# Patient Record
Sex: Male | Born: 1955 | Race: White | Hispanic: No | Marital: Single
Health system: Midwestern US, Community
[De-identification: ages and names within clinical notes are randomized; demographics above are authoritative.]

## PROBLEM LIST (undated history)

## (undated) DIAGNOSIS — N401 Enlarged prostate with lower urinary tract symptoms: Secondary | ICD-10-CM

## (undated) DIAGNOSIS — N390 Urinary tract infection, site not specified: Secondary | ICD-10-CM

## (undated) DIAGNOSIS — R339 Retention of urine, unspecified: Secondary | ICD-10-CM

## (undated) DIAGNOSIS — E11621 Type 2 diabetes mellitus with foot ulcer: Principal | ICD-10-CM

## (undated) DIAGNOSIS — E1165 Type 2 diabetes mellitus with hyperglycemia: Principal | ICD-10-CM

## (undated) DIAGNOSIS — N200 Calculus of kidney: Secondary | ICD-10-CM

## (undated) DIAGNOSIS — I10 Essential (primary) hypertension: Secondary | ICD-10-CM

## (undated) DIAGNOSIS — Z125 Encounter for screening for malignant neoplasm of prostate: Secondary | ICD-10-CM

## (undated) DIAGNOSIS — L97502 Non-pressure chronic ulcer of other part of unspecified foot with fat layer exposed: Secondary | ICD-10-CM

## (undated) DIAGNOSIS — R3912 Poor urinary stream: Secondary | ICD-10-CM

## (undated) DIAGNOSIS — L089 Local infection of the skin and subcutaneous tissue, unspecified: Secondary | ICD-10-CM

## (undated) DIAGNOSIS — R7989 Other specified abnormal findings of blood chemistry: Secondary | ICD-10-CM

## (undated) DIAGNOSIS — E119 Type 2 diabetes mellitus without complications: Secondary | ICD-10-CM

---

## 2017-04-05 ENCOUNTER — Inpatient Hospital Stay
Admit: 2017-04-05 | Discharge: 2017-04-05 | Disposition: A | Discharge status: Home / Self Care | Payer: PRIVATE HEALTH INSURANCE | Attending: Physician Assistant

## 2017-04-05 DIAGNOSIS — N390 Urinary tract infection, site not specified: Secondary | ICD-10-CM

## 2017-04-05 LAB — UA WITH REFLEX MICRO AND CULTURE
Bilirubin: NEGATIVE
Glucose: NEGATIVE mg/dL
Ketone: NEGATIVE mg/dL
Nitrites: NEGATIVE
Protein: NEGATIVE mg/dL
Specific gravity: 1.011 (ref 1.001–1.035)
Urobilinogen: 0.2 EU/dL (ref 0.2–1.0)
pH (UA): 7 (ref 5–8)

## 2017-04-05 LAB — URINE MICROSCOPIC WITH REFLEX CULTURE
Bacteria: NOT DETECTED /hpf
Epithelial cells: NOT DETECTED /hpf (ref <5)
Hyaline cast: NOT DETECTED /lpf (ref <8)
RBC: 2 /hpf (ref <4)
WBC: >100 /hpf — ABNORMAL HIGH (ref <5)

## 2017-04-05 MED ORDER — CIPROFLOXACIN 500 MG TAB
500 mg | ORAL_TABLET | Freq: Two times a day (BID) | ORAL | 0 refills | Status: AC
Start: 2017-04-05 — End: 2017-04-12

## 2017-04-05 MED ORDER — CIPROFLOXACIN 500 MG TAB
500 mg | ORAL | Status: AC
Start: 2017-04-05 — End: 2017-04-05
  Administered 2017-04-05: 18:00:00 via ORAL

## 2017-04-05 MED FILL — CIPROFLOXACIN 500 MG TAB: 500 mg | ORAL | Qty: 1

## 2017-04-05 NOTE — ED Triage Notes (Signed)
Patient here with symptoms of UTI. Patient states his urine is cloudy, denies abdominal pain, positive for fevers and chills for the last 3 days, mild burning in urination. Patient states that he has had for the last days some viral illness which resulted in lack of energy, feeling run down, spouse was sick with similar symptoms for 3 days.

## 2017-04-05 NOTE — Progress Notes (Signed)
Patient on Cipro sensitivities pending

## 2017-04-05 NOTE — Other (Signed)
Pt verbalizes understanding of discharge instructions, finding a PCP and medication.

## 2017-04-05 NOTE — ED Provider Notes (Addendum)
62 year old male presents with urinary symptoms and flu symptoms.  The patient states his urinary symptoms including dysuria and frequency began well over a week ago, close to 2 weeks ago.  His wife developed flu-like symptoms last week, including high fever, body aches, and very simple URI symptoms. A few days later he developed the same.  She room proved completely in 3 days.  He continues to have his symptoms along with the continued dysuria and frequency.  He is a type 2 diabetic.  He has no history of UTIs.   He has felt feverish but has not taken his temperature.  He has had chills, extreme fatigue.  No URI symptoms. No nausea or vomiting, but appetite is poor and he admits to not drinking enough fluids.      The history is provided by the patient.   Bladder Infection    Episode onset: 1-2 weeks. The problem occurs every urination. The problem has not changed since onset.The pain is mild. There has been no fever. Associated symptoms include chills and frequency. Pertinent negatives include no sweats, no nausea, no vomiting, no discharge, no hematuria, no hesitancy and no flank pain.        No past medical history on file.    No past surgical history on file.      No family history on file.    Social History     Socioeconomic History   ??? Marital status: SINGLE     Spouse name: Not on file   ??? Number of children: Not on file   ??? Years of education: Not on file   ??? Highest education level: Not on file   Social Needs   ??? Financial resource strain: Not on file   ??? Food insecurity - worry: Not on file   ??? Food insecurity - inability: Not on file   ??? Transportation needs - medical: Not on file   ??? Transportation needs - non-medical: Not on file   Occupational History   ??? Not on file   Tobacco Use   ??? Smoking status: Not on file   Substance and Sexual Activity   ??? Alcohol use: Not on file   ??? Drug use: Not on file   ??? Sexual activity: Not on file   Other Topics Concern   ??? Not on file   Social History Narrative    ??? Not on file         ALLERGIES: Pcn [penicillins] and Sulfa (sulfonamide antibiotics)    Review of Systems   Constitutional: Positive for chills.   Gastrointestinal: Negative for nausea and vomiting.   Genitourinary: Positive for frequency. Negative for flank pain, hematuria and hesitancy.   All other systems reviewed and are negative.      Vitals:    04/05/17 1154   BP: 125/63   Pulse: 81   Resp: 16   Temp: 98.8 ??F (37.1 ??C)   SpO2: 95%   Weight: 127 kg (280 lb)   Height: 6\' 5"  (1.956 m)            Physical Exam   Constitutional: He is oriented to person, place, and time. He appears well-developed and well-nourished.   HENT:   Head: Normocephalic.   Right Ear: External ear normal.   Left Ear: External ear normal.   Nose: Nose normal.   Mouth/Throat: Oropharynx is clear and moist. No oropharyngeal exudate.   Eyes: EOM are normal. Pupils are equal, round, and reactive to light.  Neck: Normal range of motion.   Cardiovascular: Normal rate, regular rhythm and normal heart sounds.   Pulmonary/Chest: Effort normal and breath sounds normal.   Abdominal: Soft. Bowel sounds are normal. There is no tenderness.   No CVA tenderness.   Musculoskeletal: Normal range of motion. He exhibits no edema, tenderness or deformity.   Lymphadenopathy:     He has no cervical adenopathy.   Neurological: He is alert and oriented to person, place, and time. He has normal reflexes.   Skin: Skin is warm and dry. No rash noted. No pallor.   Psychiatric: He has a normal mood and affect. Thought content normal.   Nursing note and vitals reviewed.       MDM  Number of Diagnoses or Management Options  Diagnosis management comments: Pleasant 62 year old male presents with urinary symptoms. 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  No prior UTIs.  On exam, the patient is comfortable, afebrile, nontoxic appearing.  No CVA tenderness. He is a type 2 diabetic with well controlled blood sugar.  There has been no vomiting.   The patient also  has an apparent exposure to a flu-like illness, as his wife describes she had flu-like symptoms last week.   Urinalysis will be obtained.  The patient will be reassessed.    ED Course as of Apr 06 1302   Sat Apr 05, 2017   1301 This patient presents with suspicion for urinary tract infection symptoms. He is a diabetic.  On exam, the patient is in no acute distress, afebrile, nontoxic appearing.  He does have greater than 100 WBCs in his urine, but no bacteria.  He will be treated empirically with Cipro twice per day for 7 days and will follow up closely with primary care.  Urine culture is pending.  [PD]      ED Course User Index  [PD] Glendon Axeias, Lafern Brinkley A., PA-C       Procedures

## 2017-04-08 LAB — CULTURE, URINE

## 2017-11-12 ENCOUNTER — Encounter: Attending: Family Medicine | Primary: Family Medicine

## 2019-01-20 ENCOUNTER — Ambulatory Visit
Admit: 2019-01-20 | Discharge: 2019-01-20 | Payer: PRIVATE HEALTH INSURANCE | Attending: Family Medicine | Primary: Family Medicine

## 2019-01-20 ENCOUNTER — Ambulatory Visit: Attending: Family Medicine | Primary: Family Medicine

## 2019-01-20 DIAGNOSIS — I10 Essential (primary) hypertension: Secondary | ICD-10-CM

## 2019-01-20 MED ORDER — DOXYCYCLINE 100 MG TAB
100 mg | ORAL_TABLET | Freq: Two times a day (BID) | ORAL | 1 refills | Status: AC
Start: 2019-01-20 — End: 2019-02-03

## 2019-01-20 MED ORDER — LISINOPRIL 5 MG TAB
5 mg | ORAL_TABLET | Freq: Every day | ORAL | 0 refills | Status: DC
Start: 2019-01-20 — End: 2019-02-02

## 2019-01-20 MED ORDER — AMLODIPINE 10 MG TAB
10 mg | ORAL_TABLET | ORAL | 0 refills | Status: DC
Start: 2019-01-20 — End: 2019-04-26

## 2019-01-20 MED ORDER — ATENOLOL 50 MG TAB
50 mg | ORAL_TABLET | ORAL | 0 refills | Status: DC
Start: 2019-01-20 — End: 2019-04-26

## 2019-01-20 NOTE — Progress Notes (Signed)
HPI  Dominic Stephens is a 63 y.o. male who presents for his 1st visit with me to get established.  The schedule said a physical, but he told my nurse that he really wants to just me to order labs related to current symptoms. [employer], minimal impact of   Covid on his work. Unmarried. He wants to have labs ordered and have urine ordered as he suspect urinary tract infection. Most recent primary care Dr Venora Maples on  [address] about 1.5 years ago. Has seen Dr Herschel Senegal  At [facility] in [place]. He has hx of chornic prostatitis, but no hx of surgery. May have  Passed "bladder stone" about a year ago.        Almost 2 years ago he had 2 day [facility] admit for  Foot infection        ROS:  Constit:no fever  GI:no nausea or diarrhea. Says he has never had  Difficulty with antibiotic causing diarrhea or C dif.   GU:no flank pain. He thinks urine has some pus in   It but he has not dysuria or increased frequency.   Occasional testicular pain, not today. He has had perineal  Pressure at times in the past, but not  lately.  He has frequently found that sexual activity has triggered these symptoms. Last active 8 months ago  Endo:currently on no meds, has been on metformin in the past. Says when he is careful with his diet he can bring the sugar right down    Exam:  Tall overweight neatly groomed pleasant gentleman who who does not appear uncomfortable.  Skin color and temperature and moisture normal.  Conjunctiva quiet sclerae anicteric.  Lungs are clear with good aeration, no CVA tenderness.  Heart regular without murmur.  Abdomen obese.  There is no palpable hepatosplenomegaly.  No fluid wave or shifting dullness.  Bladder is not palpable, no tenderness to deep palpation.        Assessment:  1) suspected prostatitis:  Past history of chronic prostatitis, and may be spontaneous passage of bladder stones in the past.  He is not really feeling prostate pain at at this time, he describes a history of having  infections get stirred up with sexual activity but last such activity was 8 months ago.  I told him I am ordering urine tests and a test of kidney function, I prescribed doxycycline which he can begin once he has mid submitted the urine test at [facility].  2)HTN:  Blood pressure okay today, I refilled a 90 day supply of his 3 medications  3)DM2:  Not currently on any medication, he suspects his sugar is running high, it sounds like lack of insurance has interfered with his care over the last several months  4)Health Maintenance:  Today's appointment was technically supposed to be health maintenance, but really he wanted to attend to other things before the physical.  Once the infection is settled down, I certainly will want the physical to be scheduled and I can order other fasting labs to precede that physical.        Julianne Rice, MD

## 2019-01-20 NOTE — Progress Notes (Signed)
HPI  Dominic Stephens is a 63 y.o. male who presents for his 1st visit with me to get established.  The schedule said a physical, but he told my nurse that he really wants to just me to order labs related to current symptoms. [employer], minimal impact of   Covid on his work. Unmarried. He wants to have labs ordered and have urine ordered as he suspect urinary tract infection. Most recent primary care Dr Venora Maples on  [address] about 1.5 years ago. Has seen Dr Herschel Senegal  At [facility] in [place]. He has hx of chornic prostatitis, but no hx of surgery. May have  Passed "bladder stone" about a year ago.        Almost 2 years ago he had 2 day [facility] admit for  Foot infection        ROS:  Constit:no fever  GI:no nausea or diarrhea. Says he has never had  Difficulty with antibiotic causing diarrhea or C dif.   GU:no flank pain. He thinks urine has some pus in   It but he has not dysuria or increased frequency.   Occasional testicular pain, not today. He has had perineal  Pressure at times in the past, but not  lately.  He has frequently found that sexual activity has triggered these symptoms. Last active 8 months ago  Endo:currently on no meds, has been on metformin in the past. Says when he is careful with his diet he can bring the sugar right down    Exam:  Tall overweight neatly groomed pleasant gentleman who who does not appear uncomfortable.  Skin color and temperature and moisture normal.  Conjunctiva quiet sclerae anicteric.  Lungs are clear with good aeration, no CVA tenderness.  Heart regular without murmur.  Abdomen obese.  There is no palpable hepatosplenomegaly.  No fluid wave or shifting dullness.  Bladder is not palpable, no tenderness to deep palpation.        Assessment:   1) suspected prostatitis:  Past history of chronic prostatitis, and may be spontaneous passage of bladder stones in the past.  He is not really feeling prostate pain at at this time, he describes a history of having infections get stirred up with sexual activity but last such activity was 8 months ago.  I told him I am ordering urine tests and a test of kidney function, I prescribed doxycycline which he can begin once he has mid submitted the urine test at [facility].  2)HTN:  Blood pressure okay today, I refilled a 90 day supply of his 3 medications  3)DM2:  Not currently on any medication, he suspects his sugar is running high, it sounds like lack of insurance has interfered with his care over the last several months  4)Health Maintenance:  Today's appointment was technically supposed to be health maintenance, but really he wanted to attend to other things before the physical.  Once the infection is settled down, I certainly will want the physical to be scheduled and I can order other fasting labs to precede that physical.        Julianne Rice, MD

## 2019-01-20 NOTE — Patient Instructions (Signed)
We talked about your history of prostate infections and Diabetes. I have ordered  Kidney function blood test and urine tests for you to have done at [facility] or  [facility], we will contact you when we have the results. I sent in an Rx for  Doxycycline for possible prostate infection, and also 90day supplies of your  3 blood pressure medications. After your infection is settled down I want you to schedule a physical, and I can order fasting labs to precede the physical.

## 2019-01-25 NOTE — Telephone Encounter (Signed)
Received a note from [facility] stating that [facility] was looking to release urgent results and wanted to speak to the on call provider    Scanned note for reference

## 2019-01-26 ENCOUNTER — Encounter

## 2019-01-26 LAB — URINALYSIS W/MICROSCOPIC
Bilirubin: NEGATIVE
Hyaline cast: NONE SEEN /LPF
Ketone: NEGATIVE
Nitrites: POSITIVE — AB
Protein: NEGATIVE
SQUAMOUS EPITHELIAL CELLS: NONE SEEN /HPF (ref <=5)
Specific gravity: 1.013 (ref 1.001–1.035)
pH (UA): 7 (ref 5.0–8.0)

## 2019-01-26 LAB — CULTURE, URINE

## 2019-01-26 LAB — METABOLIC PANEL, COMPREHENSIVE
ALB/GLOBRATIO: 1.8 (calc) (ref 1.0–2.5)
ALT (SGPT): 33 U/L (ref 9–46)
AST (SGOT): 24 U/L (ref 10–35)
Albumin: 4.3 g/dL (ref 3.6–5.1)
Alkaline Phosphatase, total: 92 U/L (ref 35–144)
BUN: 21 mg/dL (ref 7–25)
Bilirubin, total: 0.5 mg/dL (ref 0.2–1.2)
CO2: 28 mmol/L (ref 20–32)
Calcium: 9.6 mg/dL (ref 8.6–10.3)
Chloride: 98 mmol/L (ref 98–110)
Creatinine: 1.21 mg/dL (ref 0.70–1.25)
GFR est AA: 74 mL/min/{1.73_m2} (ref >=60)
GFR est non-AA: 64 mL/min/{1.73_m2} (ref >=60)
Globulin: 2.4 g/dL (calc) (ref 1.9–3.7)
Glucose: 445 mg/dL — ABNORMAL HIGH (ref 65–99)
Potassium: 4.4 mmol/L (ref 3.5–5.3)
Protein, total: 6.7 g/dL (ref 6.1–8.1)
Sodium: 137 mmol/L (ref 135–146)

## 2019-01-26 LAB — URINALYSIS WITH MICROSCOPIC
Bilirubin, Urine: NEGATIVE
Hyaline Casts, UA: NONE SEEN /LPF
Ketones, Urine: NEGATIVE
Nitrite, Urine: POSITIVE — AB
Protein, UA: NEGATIVE
Specific Gravity, UA: 1.013 NA (ref 1.001–1.035)
Squamous Epithelial Cells: NONE SEEN /HPF (ref <=5)
pH, UA: 7 NA (ref 5.0–8.0)

## 2019-01-26 LAB — COMPREHENSIVE METABOLIC PANEL
ALT: 33 U/L (ref 9–46)
AST: 24 U/L (ref 10–35)
Albumin/Globulin Ratio: 1.8 (calc) (ref 1.0–2.5)
Albumin: 4.3 g/dL (ref 3.6–5.1)
Alkaline Phosphatase: 92 U/L (ref 35–144)
BUN: 21 mg/dL (ref 7–25)
CO2: 28 mmol/L (ref 20–32)
Calcium: 9.6 mg/dL (ref 8.6–10.3)
Chloride: 98 mmol/L (ref 98–110)
Creatinine: 1.21 mg/dL (ref 0.70–1.25)
EGFR IF NonAfrican American: 64 mL/min/{1.73_m2} (ref >=60)
GFR African American: 74 mL/min/{1.73_m2} (ref >=60)
Globulin: 2.4 g/dL (calc) (ref 1.9–3.7)
Glucose: 445 mg/dL — ABNORMAL HIGH (ref 65–99)
Potassium: 4.4 mmol/L (ref 3.5–5.3)
Sodium: 137 mmol/L (ref 135–146)
Total Bilirubin: 0.5 mg/dL (ref 0.2–1.2)
Total Protein: 6.7 g/dL (ref 6.1–8.1)

## 2019-01-26 MED ORDER — LANCETS
11 refills | Status: AC
Start: 2019-01-26 — End: ?

## 2019-01-26 MED ORDER — TRUE METRIX GLUCOSE TEST STRIP
ORAL_STRIP | 1 refills | Status: AC
Start: 2019-01-26 — End: ?

## 2019-01-26 MED ORDER — METFORMIN 500 MG TAB
500 mg | ORAL_TABLET | Freq: Two times a day (BID) | ORAL | 3 refills | Status: DC
Start: 2019-01-26 — End: 2019-04-26

## 2019-01-26 NOTE — Progress Notes (Signed)
Critical lab value with glucose 445 noted.  No anion gap noted on labs.  He is not acidotic. I called the patient.  He admits that he has had a poor diet secondary to the holidays.  Eating foods high in sugar and carbs.  Additionally, he has run out of his metformin has been off this medicine for quite some time.  He denies any abdominal pain, nausea, vomiting, or any other signs or symptoms of DKA at this time.  He is mentating well on the phone, oriented x3.  Will refill his metformin 500 mg b.i.d..  Will likely need to up titrate the dose to achieve 1000 mg b.i.d. patient also overdue for hemoglobin A1c.  May need to add additional oral diabetic medication.  Precautions of when to go to the emergency department given. Will also order lancets and test strips, sent to pharmacy.

## 2019-01-26 NOTE — Telephone Encounter (Signed)
Spoke to [facility] lab results have been sent and are in chart Patient had a Glucose of 445 on 01/21/2019. Routing to covering provider.   Signed By: Alphonsus Sias, LPN     January 26, 2019

## 2019-01-26 NOTE — Telephone Encounter (Signed)
Addressed. See documentation note

## 2019-01-26 NOTE — Progress Notes (Signed)
Critical lab value with glucose 445 noted.  No anion gap noted on labs.  He is not acidotic. I called the patient.  He admits that he has had a poor diet secondary to the holidays.  Eating foods high in sugar and carbs.  Additionally, he has run out of his metformin has been off this medicine for quite some time.  He denies any abdominal pain, nausea, vomiting, or any other signs or symptoms of DKA at this time.  He is mentating well on the phone, oriented x3.  Will refill his metformin 500 mg b.i.d..  Will likely need to up titrate the dose to achieve 1000 mg b.i.d. patient also overdue for hemoglobin A1c.  May need to add additional oral diabetic medication.  Precautions of when to go to the emergency department given. Will also order lancets and test strips, sent to pharmacy.

## 2019-01-28 NOTE — Telephone Encounter (Signed)
Pt has been waiting for urinalysis results. Please call back and inform.

## 2019-01-28 NOTE — Telephone Encounter (Signed)
Spoke with patient, notify him of final urine culture ans sensitivity result and  new order for antibiotic written by doctor Messier, patient shows understanding and in agreement with plan. Patient states," he will follow-up with PCP"  Signed By: Juliann MuleKettia Guerrier, LPN     January 28, 2019

## 2019-02-02 ENCOUNTER — Encounter

## 2019-02-02 MED ORDER — LISINOPRIL 5 MG TAB
5 mg | ORAL_TABLET | Freq: Every day | ORAL | 1 refills | Status: DC
Start: 2019-02-02 — End: 2019-04-26

## 2019-02-02 NOTE — Telephone Encounter (Addendum)
Dominic Stephens  1955-05-15      Call back needed: no    Preferred call back number: Call preference: Home phone   [phone] (home)    No relevant phone numbers on file.        Medications Requested:  Requested Prescriptions     Pending Prescriptions Disp Refills   ??? lisinopriL (PRINIVIL, ZESTRIL) 5 mg tablet 90 Tab 0     Sig: Take 1 Tab by mouth daily.             Preferred Pharmacy:   [facility] [address] [place] - [address]  [address]  [place] [address]  Phone: [phone] Fax: [phone]                                       Last visit with provider:  01/20/2019    No future appointments.    MOST RECENT BLOOD PRESSURES  BP Readings from Last 3 Encounters:   01/20/19 120/72   04/05/17 125/63        MOST RECENT LAB DATA  Lab Results   Component Value Date/Time    Creatinine 1.21 01/21/2019 11:14 AM    Potassium 4.4 01/21/2019 11:14 AM    ALT (SGPT) 33 01/21/2019 11:14 AM     Pt is requested 90 day supply.

## 2019-04-26 ENCOUNTER — Ambulatory Visit
Admit: 2019-04-26 | Discharge: 2019-04-26 | Payer: PRIVATE HEALTH INSURANCE | Attending: Medical | Primary: Family Medicine

## 2019-04-26 ENCOUNTER — Encounter

## 2019-04-26 ENCOUNTER — Ambulatory Visit: Attending: Medical | Primary: Family Medicine

## 2019-04-26 DIAGNOSIS — R300 Dysuria: Secondary | ICD-10-CM

## 2019-04-26 LAB — AMB POC URINALYSIS DIP STICK MANUAL W/O MICRO
Bilirubin (UA POC): NEGATIVE
Bilirubin, Urine, POC: NEGATIVE
Ketones (UA POC): NEGATIVE
Ketones, Urine, POC: NEGATIVE
Nitrite, Urine, POC: NEGATIVE
Nitrites (UA POC): NEGATIVE
Specific Gravity, Urine, POC: 1.025 NA (ref 1.001–1.035)
Specific gravity (UA POC): 1.025 (ref 1.001–1.035)
Urobilinogen (UA POC): 0.2 (ref 0.2–1)
Urobilinogen, POC: 0.2 (ref 0.2–1)
pH (UA POC): 7 (ref 4.6–8.0)
pH, Urine, POC: 7 NA (ref 4.6–8.0)

## 2019-04-26 MED ORDER — ATENOLOL 50 MG TAB
50 mg | ORAL_TABLET | ORAL | 0 refills | Status: AC
Start: 2019-04-26 — End: ?

## 2019-04-26 MED ORDER — DOXYCYCLINE 100 MG TAB
100 mg | ORAL_TABLET | Freq: Two times a day (BID) | ORAL | 0 refills | Status: AC
Start: 2019-04-26 — End: ?

## 2019-04-26 MED ORDER — LISINOPRIL 5 MG TAB
5 mg | ORAL_TABLET | Freq: Every day | ORAL | 1 refills | Status: AC
Start: 2019-04-26 — End: ?

## 2019-04-26 MED ORDER — AMLODIPINE 10 MG TAB
10 mg | ORAL_TABLET | ORAL | 0 refills | Status: AC
Start: 2019-04-26 — End: ?

## 2019-04-26 NOTE — Progress Notes (Signed)
Dominic Stephens is a 64 y.o. male complaining of Urinary Frequency (Cloudy urine, some pus seen, previous prostatitis ~3 months ago) and Medication Refill  Cloudy urine, odor, frequency, but no dysuria.  Pt is sure is his prostatitis again.  He has been treated in the past for UTI and is frustrated when his sxs come back.  He believes that being treated with a short course of antibiotics for UTI is not sufficient and he needs to be on a longer treatment course for prostatitis.  Patient says that every time he has relations with his wife, he ends up with prostatitis.  Pt checks glucose at home and says was in the 200's in the AM.  Pt has hx of diabetes and is off all meds.  He wants to focus on lifestyle management only.      No past medical history on file.    Social History     Tobacco Use   ??? Smoking status: Never Smoker   ??? Smokeless tobacco: Never Used   Substance Use Topics   ??? Alcohol use: Not on file   ??? Drug use: Not on file       ROS    Visit Vitals  BP 134/76 (BP 1 Location: Left upper arm, BP Patient Position: Sitting, BP Cuff Size: Large adult)   Pulse (!) 56   Resp 16   Ht 6\' 5"  (1.956 m)   Wt 308 lb (139.7 kg)   BMI 36.52 kg/m??       Physical Exam  Constitutional:       General: He is not in acute distress.     Appearance: Normal appearance. He is well-developed. He is obese.   HENT:      Head: Normocephalic and atraumatic.   Eyes:      General: No scleral icterus.     Conjunctiva/sclera: Conjunctivae normal.      Pupils: Pupils are equal, round, and reactive to light.   Neck:      Musculoskeletal: Neck supple.   Cardiovascular:      Rate and Rhythm: Normal rate and regular rhythm.      Heart sounds: Normal heart sounds. No murmur. No friction rub. No gallop.    Pulmonary:      Effort: Pulmonary effort is normal. No respiratory distress.      Breath sounds: Normal breath sounds. No wheezing, rhonchi or rales.   Genitourinary:     Comments: Pt declined exam  Lymphadenopathy:      Cervical: No cervical  adenopathy.   Skin:     General: Skin is warm and dry.   Neurological:      General: No focal deficit present.      Mental Status: He is alert and oriented to person, place, and time.   Psychiatric:         Mood and Affect: Mood normal.         Behavior: Behavior normal.         Diagnoses and all orders for this visit:    1. Dysuria  -     AMB POC URINALYSIS DIP STICK MANUAL W/O MICRO  -     doxycycline (ADOXA) 100 mg tablet; Take 1 Tab by mouth two (2) times a day.  -     URINE MICROSCOPIC WITH REFLEX CULTURE; Future  Urine dip was abnormal and based on pass treatments he will give him doxycycline 100 mg b.i.d. for 14 days.  Reviewed his last infections and cultures  and based on sensitivities he has been resistant to Cipro and is allergic to Bactrim.  Doxycycline seems to be sensitive in all pass urine tests so will go with this antibiotic.  Patient is encouraged to follow-up with urology.  2. Essential hypertension, benign  -     lisinopriL (PRINIVIL, ZESTRIL) 5 mg tablet; Take 1 Tab by mouth daily.  -     atenoloL (TENORMIN) 50 mg tablet; TAKE 1 TABLET BY MOUTH TWICE DAILY  -     amLODIPine (NORVASC) 10 mg tablet; TAKE 1 TABLET BY MOUTH ONCE DAILY  -     LIPID PANEL W/ REFLX DIRECT LDL; Future  -     CBC WITH AUTOMATED DIFF; Future  Pt is to continue same meds and get labs done.  Schedule a f/u app with his PCP, Dr. Jillyn Hidden.  3. Uncontrolled type 2 diabetes mellitus with hyperglycemia (HCC)  -     lisinopriL (PRINIVIL, ZESTRIL) 5 mg tablet; Take 1 Tab by mouth daily.  -     CBC WITH AUTOMATED DIFF; Future  -     HEMOGLOBIN A1C WITH EAG; Future  -     METABOLIC PANEL, COMPREHENSIVE; Future  -     MICROALBUMIN, UR, RAND; Future  -     LIPID PANEL W/ REFLX DIRECT LDL; Future  -     METABOLIC PANEL, COMPREHENSIVE; Future  -     MICROALBUMIN, UR, RAND; Future  -     HEMOGLOBIN A1C WITH EAG; Future  -     LIPID PANEL W/ REFLX DIRECT LDL; Future  -     CBC WITH AUTOMATED DIFF; Future  Labs ordered.  Pt goes to outside  lab for testing  4. Prostate cancer screening  -     PSA SCREENING (SCREENING); Future  -     PSA, DIAGNOSTIC (PROSTATE SPECIFIC AG); Future    5. Encounter for hepatitis C screening test for low risk patient  -     HEPATITIS C AB, RFLX TO QT BY PCR; Future  Reviewed CDC recommendations for one time Hepatitis C screening in individuals 75 and 63 years old  Patient is amenable.  Lab test is ordered.   6. Chronic prostatitis  -     doxycycline (ADOXA) 100 mg tablet; Take 1 Tab by mouth two (2) times a day.  -     URINE MICROSCOPIC WITH REFLEX CULTURE; Future  See assessment #1      [name], PA  05/01/19  4:54 PM

## 2019-04-26 NOTE — Patient Instructions (Addendum)
Learning About Diabetes Food Guidelines  Your Care Instructions     Meal planning is important to manage diabetes. It helps keep your blood sugar at a target level (which you set with your doctor). You don't have to eat special foods. You can eat what your family eats, including sweets once in a while. But you do have to pay attention to how often you eat and how much you eat of certain foods.  You may want to work with a dietitian or a certified diabetes educator (CDE) to help you plan meals and snacks. A dietitian or CDE can also help you lose weight if that is one of your goals.  What should you know about eating carbs?  Managing the amount of carbohydrate (carbs) you eat is an important part of healthy meals when you have diabetes. Carbohydrate is found in many foods.  ?? Learn which foods have carbs. And learn the amounts of carbs in different foods.  ? Bread, cereal, pasta, and rice have about 15 grams of carbs in a serving. A serving is 1 slice of bread (1 ounce), ?? cup of cooked cereal, or 1/3 cup of cooked pasta or rice.  ? Fruits have 15 grams of carbs in a serving. A serving is 1 small fresh fruit, such as an apple or orange; ?? of a banana; ?? cup of cooked or canned fruit; ?? cup of fruit juice; 1 cup of melon or raspberries; or 2 tablespoons of dried fruit.  ? Milk and no-sugar-added yogurt have 15 grams of carbs in a serving. A serving is 1 cup of milk or 2/3 cup of no-sugar-added yogurt.  ? Starchy vegetables have 15 grams of carbs in a serving. A serving is ?? cup of mashed potatoes or sweet potato; 1 cup winter squash; ?? of a small baked potato; ?? cup of cooked beans; or ?? cup cooked corn or green peas.  ?? Learn how much carbs to eat each day and at each meal. A dietitian or CDE can teach you how to keep track of the amount of carbs you eat. This is called carbohydrate counting.  ?? If you are not sure how to count carbohydrate grams, use the Plate Method to plan meals. It is a good, quick way to  make sure that you have a balanced meal. It also helps you spread carbs throughout the day.  ? Divide your plate by types of foods. Put non-starchy vegetables on half the plate, meat or other protein food on one-quarter of the plate, and a grain or starchy vegetable in the final quarter of the plate. To this you can add a small piece of fruit and 1 cup of milk or yogurt, depending on how many carbs you are supposed to eat at a meal.  ?? Try to eat about the same amount of carbs at each meal. Do not "save up" your daily allowance of carbs to eat at one meal.  ?? Proteins have very little or no carbs per serving. Examples of proteins are beef, chicken, turkey, fish, eggs, tofu, cheese, cottage cheese, and peanut butter. A serving size of meat is 3 ounces, which is about the size of a deck of cards. Examples of meat substitute serving sizes (equal to 1 ounce of meat) are 1/4 cup of cottage cheese, 1 egg, 1 tablespoon of peanut butter, and ?? cup of tofu.  How can you eat out and still eat healthy?  ?? Learn to estimate the serving sizes of   foods that have carbohydrate. If you measure food at home, it will be easier to estimate the amount in a serving of restaurant food.  ?? If the meal you order has too much carbohydrate (such as potatoes, corn, or baked beans), ask to have a low-carbohydrate food instead. Ask for a salad or green vegetables.  ?? If you use insulin, check your blood sugar before and after eating out to help you plan how much to eat in the future.  ?? If you eat more carbohydrate at a meal than you had planned, take a walk or do other exercise. This will help lower your blood sugar.  What else should you know?  ?? Limit saturated fat, such as the fat from meat and dairy products. This is a healthy choice because people who have diabetes are at higher risk of heart disease. So choose lean cuts of meat and nonfat or low-fat dairy products. Use olive or canola oil instead of butter or shortening when cooking.  ??  Don't skip meals. Your blood sugar may drop too low if you skip meals and take insulin or certain medicines for diabetes.  ?? Check with your doctor before you drink alcohol. Alcohol can cause your blood sugar to drop too low. Alcohol can also cause a bad reaction if you take certain diabetes medicines.  Follow-up care is a key part of your treatment and safety. Be sure to make and go to all appointments, and call your doctor if you are having problems. It's also a good idea to know your test results and keep a list of the medicines you take.  Where can you learn more?  Go to http://clayton-rivera.info/  Enter I147 in the search box to learn more about "Learning About Diabetes Food Guidelines."  Current as of: January 16, 2018??????????????????????????????Content Version: 12.6  ?? 2006-2020 [employer].   Care instructions adapted under license by [employer] (which disclaims liability or warranty for this information). If you have questions about a medical condition or this instruction, always ask your healthcare professional. Boyd any warranty or liability for your use of this information.         Diabetes Foot Health: Care Instructions  Your Care Instructions     When you have diabetes, your feet need extra care and attention. Diabetes can damage the nerve endings and blood vessels in your feet, making you less likely to notice when your feet are injured. Diabetes also limits your body's ability to fight infection and get blood to areas that need it. If you get a minor foot injury, it could become an ulcer or a serious infection. With good foot care, you can prevent most of these problems.  Caring for your feet can be quick and easy. Most of the care can be done when you are bathing or getting ready for bed.  Follow-up care is a key part of your treatment and safety. Be sure to make and go to all appointments, and call your doctor if you are having problems.  It's also a good idea to know your test results and keep a list of the medicines you take.  How can you care for yourself at home?  ?? Keep your blood sugar close to normal by watching what and how much you eat, monitoring blood sugar, taking medicines if prescribed, and getting regular exercise.  ?? Do not smoke. Smoking affects blood flow and can make foot problems worse. If you need help quitting, talk to  your doctor about stop-smoking programs and medicines. These can increase your chances of quitting for good.  ?? Eat a diet that is low in fats. High fat intake can cause fat to build up in your blood vessels and decrease blood flow.  ?? Inspect your feet daily for blisters, cuts, cracks, or sores. If you cannot see well, use a mirror or have someone help you.  ?? Take care of your feet:  ? Wash your feet every day. Use warm (not hot) water. Check the water temperature with your wrists or other part of your body, not your feet.  ? Dry your feet well. Pat them dry. Do not rub the skin on your feet too hard. Dry well between your toes. If the skin on your feet stays moist, bacteria or a fungus can grow, which can lead to infection.  ? Keep your skin soft. Use moisturizing skin cream to keep the skin on your feet soft and prevent calluses and cracks. But do not put the cream between your toes, and stop using any cream that causes a rash.  ? Clean underneath your toenails carefully. Do not use a sharp object to clean underneath your toenails. Use the blunt end of a nail file or other rounded tool.  ? Trim and file your toenails straight across to prevent ingrown toenails. Use a nail clipper, not scissors. Use an emery board to smooth the edges.  ?? Change socks daily. Socks without seams are best, because seams often rub the feet. You can find socks for people with diabetes from specialty catalogs.  ?? Look inside your shoes every day for things like gravel or torn linings, which could cause blisters or sores.  ?? Buy shoes  that fit well:  ? Look for shoes that have plenty of space around the toes. This helps prevent bunions and blisters.  ? Try on shoes while wearing the kind of socks you will usually wear with the shoes.  ? Avoid plastic shoes. They may rub your feet and cause blisters. Good shoes should be made of materials that are flexible and breathable, such as leather or cloth.  ? Break in new shoes slowly by wearing them for no more than an hour a day for several days. Take extra time to check your feet for red areas, blisters, or other problems after you wear new shoes.  ?? Do not go barefoot. Do not wear sandals, and do not wear shoes with very thin soles. Thin soles are easy to puncture. They also do not protect your feet from hot pavement or cold weather.  ?? Have your doctor check your feet during each visit. If you have a foot problem, see your doctor. Do not try to treat an early foot problem at home. Home remedies or treatments that you can buy without a prescription (such as corn removers) can be harmful.  ?? Always get early treatment for foot problems. A minor irritation can lead to a major problem if not properly cared for early.  When should you call for help?   Call your doctor now or seek immediate medical care if:  ?? ?? You have a foot sore, an ulcer or break in the skin that is not healing after 4 days, bleeding corns or calluses, or an ingrown toenail.   ?? ?? You have blue or black areas, which can mean bruising or blood flow problems.   ?? ?? You have peeling skin or tiny blisters between your toes or  cracking or oozing of the skin.   ?? ?? You have a fever for more than 24 hours and a foot sore.   ?? ?? You have new numbness or tingling in your feet that does not go away after you move your feet or change positions.   ?? ?? You have unexplained or unusual swelling of the foot or ankle.   Watch closely for changes in your health, and be sure to contact your doctor if:  ?? ?? You cannot do proper foot care.   Where can you  learn more?  Go to [email or web address]  Enter A739 in the search box to learn more about "Diabetes Foot Health: Care Instructions."  Current as of: January 16, 2018??????????????????????????????Content Version: 12.6  ?? 2006-2020 [employer].   Care instructions adapted under license by [employer] (which disclaims liability or warranty for this information). If you have questions about a medical condition or this instruction, always ask your healthcare professional. [employer]wise, Inc[employer] disclaims any warranty or liability for your use of this information.    The

## 2019-04-26 NOTE — Progress Notes (Signed)
Dominic Stephens is a 63 y.o. male complaining of Urinary Frequency (Cloudy urine, some pus seen, previous prostatitis ~3 months ago) and Medication Refill  Cloudy urine, odor, frequency, but no dysuria.  Pt is sure is his prostatitis again.  He has been treated in the past for UTI and is frustrated when his sxs come back.  He believes that being treated with a short course of antibiotics for UTI is not sufficient and he needs to be on a longer treatment course for prostatitis.  Patient says that every time he has relations with his wife, he ends up with prostatitis.  Pt checks glucose at home and says was in the 200's in the AM.  Pt has hx of diabetes and is off all meds.  He wants to focus on lifestyle management only.      No past medical history on file.    Social History     Tobacco Use   ??? Smoking status: Never Smoker   ??? Smokeless tobacco: Never Used   Substance Use Topics   ??? Alcohol use: Not on file   ??? Drug use: Not on file       ROS    Visit Vitals  BP 134/76 (BP 1 Location: Left upper arm, BP Patient Position: Sitting, BP Cuff Size: Large adult)   Pulse (!) 56   Resp 16   Ht 6' 5" (1.956 m)   Wt 308 lb (139.7 kg)   BMI 36.52 kg/m??       Physical Exam  Constitutional:       General: He is not in acute distress.     Appearance: Normal appearance. He is well-developed. He is obese.   HENT:      Head: Normocephalic and atraumatic.   Eyes:      General: No scleral icterus.     Conjunctiva/sclera: Conjunctivae normal.      Pupils: Pupils are equal, round, and reactive to light.   Neck:      Musculoskeletal: Neck supple.   Cardiovascular:      Rate and Rhythm: Normal rate and regular rhythm.      Heart sounds: Normal heart sounds. No murmur. No friction rub. No gallop.    Pulmonary:      Effort: Pulmonary effort is normal. No respiratory distress.      Breath sounds: Normal breath sounds. No wheezing, rhonchi or rales.   Genitourinary:     Comments: Pt declined exam  Lymphadenopathy:      Cervical: No cervical  adenopathy.   Skin:     General: Skin is warm and dry.   Neurological:      General: No focal deficit present.      Mental Status: He is alert and oriented to person, place, and time.   Psychiatric:         Mood and Affect: Mood normal.         Behavior: Behavior normal.         Diagnoses and all orders for this visit:    1. Dysuria  -     AMB POC URINALYSIS DIP STICK MANUAL W/O MICRO  -     doxycycline (ADOXA) 100 mg tablet; Take 1 Tab by mouth two (2) times a day.  -     URINE MICROSCOPIC WITH REFLEX CULTURE; Future  Urine dip was abnormal and based on pass treatments he will give him doxycycline 100 mg b.i.d. for 14 days.  Reviewed his last infections and cultures   and based on sensitivities he has been resistant to Cipro and is allergic to Bactrim.  Doxycycline seems to be sensitive in all pass urine tests so will go with this antibiotic.  Patient is encouraged to follow-up with urology.  2. Essential hypertension, benign  -     lisinopriL (PRINIVIL, ZESTRIL) 5 mg tablet; Take 1 Tab by mouth daily.  -     atenoloL (TENORMIN) 50 mg tablet; TAKE 1 TABLET BY MOUTH TWICE DAILY  -     amLODIPine (NORVASC) 10 mg tablet; TAKE 1 TABLET BY MOUTH ONCE DAILY  -     LIPID PANEL W/ REFLX DIRECT LDL; Future  -     CBC WITH AUTOMATED DIFF; Future  Pt is to continue same meds and get labs done.  Schedule a f/u app with his PCP, Dominic Stephens.  3. Uncontrolled type 2 diabetes mellitus with hyperglycemia (HCC)  -     lisinopriL (PRINIVIL, ZESTRIL) 5 mg tablet; Take 1 Tab by mouth daily.  -     CBC WITH AUTOMATED DIFF; Future  -     HEMOGLOBIN A1C WITH EAG; Future  -     METABOLIC PANEL, COMPREHENSIVE; Future  -     MICROALBUMIN, UR, RAND; Future  -     LIPID PANEL W/ REFLX DIRECT LDL; Future  -     METABOLIC PANEL, COMPREHENSIVE; Future  -     MICROALBUMIN, UR, RAND; Future  -     HEMOGLOBIN A1C WITH EAG; Future  -     LIPID PANEL W/ REFLX DIRECT LDL; Future  -     CBC WITH AUTOMATED DIFF; Future  Labs ordered.  Pt goes to outside  lab for testing  4. Prostate cancer screening  -     PSA SCREENING (SCREENING); Future  -     PSA, DIAGNOSTIC (PROSTATE SPECIFIC AG); Future    5. Encounter for hepatitis C screening test for low risk patient  -     HEPATITIS C AB, RFLX TO QT BY PCR; Future  Reviewed CDC recommendations for one time Hepatitis C screening in individuals 75 and 63 years old  Patient is amenable.  Lab test is ordered.   6. Chronic prostatitis  -     doxycycline (ADOXA) 100 mg tablet; Take 1 Tab by mouth two (2) times a day.  -     URINE MICROSCOPIC WITH REFLEX CULTURE; Future  See assessment #1      Dominic Barry, PA  05/01/19  4:54 PM

## 2019-04-27 ENCOUNTER — Inpatient Hospital Stay: Admit: 2019-04-27 | Payer: PRIVATE HEALTH INSURANCE | Primary: Family Medicine

## 2019-04-27 DIAGNOSIS — E1165 Type 2 diabetes mellitus with hyperglycemia: Secondary | ICD-10-CM

## 2019-04-27 LAB — URINE MICROSCOPIC WITH REFLEX CULTURE
Crystals, UA: NONE SEEN /LPF
Crystals, urine: NONE SEEN /LPF
Epithelial Cells, UA: NONE SEEN /hpf (ref <5)
Epithelial cells: NONE SEEN /hpf (ref <5)
Hyaline Casts, UA: NONE SEEN /lpf
Hyaline cast: NONE SEEN /lpf

## 2019-04-27 LAB — MICROALBUMIN, UR, RAND
Creatinine, urine random: 59.9 mg/dL
Microalbumin, urine: 61.6 mg/L
Microalbumin/Creat ratio (mg/g creat): 103 mg/g — ABNORMAL HIGH (ref <30)

## 2019-04-27 LAB — MICROALBUMIN, UR
Creatinine, Ur: 59.9 mg/dL
Microalb, Ur: 61.6 mg/L
Microalbumin Creatinine Ratio: 103 mg/g — ABNORMAL HIGH (ref <30)

## 2019-04-30 LAB — CULTURE, URINE: Culture result:: >100000 — AB

## 2019-04-30 NOTE — Telephone Encounter (Signed)
Please tell this patient there are bacteria growing, but we do not have the sensitivities yet.

## 2019-04-30 NOTE — Telephone Encounter (Signed)
Notify patient of PCP message, verbalizes understanding and In agreement with plan.  Signed By: Juliann Mule, LPN     April 30, 2019

## 2019-04-30 NOTE — Telephone Encounter (Signed)
Pt called looking for urine sample results please call when free to do so

## 2019-04-30 NOTE — Telephone Encounter (Signed)
Routing to PCP.  Signed By: Kettia Guerrier, LPN     April 30, 2019

## 2019-05-03 NOTE — Telephone Encounter (Signed)
Tell pt that the culture showed staphylococcus which is a nml skin bacteria and normally not treated with abx.  He was given doxycylcline which will take care of this either way.  No add'l f/u is needed, but if sxs come back, he should see urology for consult

## 2019-05-03 NOTE — Telephone Encounter (Signed)
Patient looking for urine lab results, wants to know if he should continue with antibiotics, cause he doesn't think its' working. Routing to Provider.  Signed By: Juliann Mule, LPN     May 03, 2019        Signed By: Juliann Mule, LPN     May 03, 2019

## 2019-05-03 NOTE — Telephone Encounter (Signed)
Nurse notify patient of Provider detailed message, verbalizes understanding and in agreement with plan.  Signed By: Juliann Mule, LPN     May 03, 2019

## 2019-06-07 ENCOUNTER — Ambulatory Visit: Attending: Family Medicine | Primary: Family Medicine

## 2019-07-09 NOTE — Telephone Encounter (Signed)
Spoke with patient to r/s his dm appt that he cancelled for 5/10.   Pt will call back when he is ready to r/s.

## 2019-12-14 NOTE — Telephone Encounter (Signed)
Patients will be responsible for payment.  Given his situation, we can offer for him to receive outreach from our social worker who can assess him to see if he may qualify for free care/reduced care through the hospital.  If this is something he would like to discuss further, please route note to Beryle Lathe, MSW.  In order to actually apply, patients need to have a received a bill from the hospital.  The application cannot be done preemptively unfortunately. Carollee Herter can review the requirements to see what his situation looks like if he would like.  She may also be able to direct him to other insurance options should he qulaify (medicaid, Marketing executive)

## 2019-12-21 ENCOUNTER — Encounter

## 2019-12-31 NOTE — Telephone Encounter (Signed)
LVM with Pt containing SW contact information.

## 2021-10-31 ENCOUNTER — Emergency Department: Admit: 2021-10-31 | Primary: Family Medicine

## 2021-10-31 ENCOUNTER — Inpatient Hospital Stay
Admission: EM | Admit: 2021-10-31 | Discharge: 2021-11-04 | Disposition: A | Discharge status: Home / Self Care | Payer: MEDICARE | Admitting: Family Medicine

## 2021-10-31 DIAGNOSIS — N179 Acute kidney failure, unspecified: Secondary | ICD-10-CM

## 2021-10-31 LAB — COMPREHENSIVE METABOLIC PANEL
ALT: 19 U/L (ref 0–50)
AST: 17 U/L (ref 0–50)
Albumin/Globulin Ratio: 1 (ref 1.0–3.0)
Albumin: 3.7 g/dL (ref 3.5–5.2)
Alk Phosphatase: 106 U/L (ref 40–129)
Anion Gap: 12 mmol/L (ref 5–15)
BUN: 61 MG/DL — ABNORMAL HIGH (ref 8–23)
Bun/Cre Ratio: 24 — ABNORMAL HIGH (ref 12–20)
CO2: 19 mmol/L — ABNORMAL LOW (ref 22–29)
Calcium: 9.3 MG/DL (ref 8.8–10.2)
Chloride: 101 mmol/L (ref 98–107)
Creatinine: 2.57 MG/DL — ABNORMAL HIGH (ref 0.67–1.17)
Est, Glom Filt Rate: 27 mL/min/{1.73_m2} — ABNORMAL LOW (ref >60)
Glucose: 153 mg/dL — ABNORMAL HIGH (ref 74–109)
Potassium: 5.7 mmol/L — ABNORMAL HIGH (ref 3.5–5.1)
Sodium: 132 mmol/L — ABNORMAL LOW (ref 136–145)
Total Bilirubin: 0.38 mg/dL (ref 0–1.20)
Total Protein: 7.5 g/dL (ref 6.4–8.3)

## 2021-10-31 LAB — URINALYSIS WITH REFLEX TO CULTURE
Bilirubin Urine: NEGATIVE
Glucose, Ur: NEGATIVE mg/dL
Ketones, Urine: NEGATIVE mg/dL
Nitrite, Urine: NEGATIVE
Protein, Urine: 100 mg/dL — AB
Specific Gravity, Urine: 1.009 (ref 1.001–1.035)
Urobilinogen, Urine: 0.2 EU/dL (ref 0.2–1.0)
pH, Urine: 6 (ref 5–8)

## 2021-10-31 LAB — CBC WITH AUTO DIFFERENTIAL
Absolute Eos #: 0.2 10*3/uL (ref 0.0–0.5)
Absolute Immature Granulocyte: 0.2 10*3/uL — ABNORMAL HIGH (ref 0.00–0.03)
Absolute Lymph #: 1.8 10*3/uL (ref 1.2–3.7)
Absolute Mono #: 1.3 10*3/uL — ABNORMAL HIGH (ref 0.2–0.8)
Basophils Absolute: 0.1 10*3/uL (ref 0.0–0.1)
Basophils: 0 %
Eosinophils %: 1 %
Hematocrit: 34.4 % — ABNORMAL LOW (ref 40.1–51.0)
Hemoglobin: 10.9 g/dL — ABNORMAL LOW (ref 13.7–17.5)
Immature Granulocytes: 2 %
Lymphocytes: 13 %
MCH: 28.9 PG (ref 25.6–32.2)
MCHC: 31.7 g/dL — ABNORMAL LOW (ref 32.2–35.5)
MCV: 91.2 FL (ref 80.0–95.0)
MPV: 8.7 FL — ABNORMAL LOW (ref 9.4–12.4)
Monocytes: 10 %
Nucleated RBCs: 0 PER 100 WBC (ref 0–0.02)
Platelets: 346 10*3/uL (ref 150–400)
RBC: 3.77 M/uL — ABNORMAL LOW (ref 4.51–5.93)
RDW: 13.2 % (ref 11.6–14.4)
Seg Neutrophils: 74 %
Segs Absolute: 10.2 10*3/uL — ABNORMAL HIGH (ref 1.6–6.1)
WBC: 13.7 10*3/uL — ABNORMAL HIGH (ref 4.0–10.0)
nRBC: 0 10*3/uL

## 2021-10-31 LAB — URINE MICROSCOPIC WITH REFLEX TO CULTURE
Epithelial Cells UA: NONE SEEN /hpf (ref <5)
Hyaline Casts, UA: NONE SEEN /lpf

## 2021-10-31 LAB — TSH: TSH: 1.57 u[IU]/mL (ref 0.270–4.200)

## 2021-10-31 LAB — HEMOGLOBIN A1C
Estimated Avg Glucose: 258 mg/dL
Hemoglobin A1C: 10.6 % — ABNORMAL HIGH (ref 4.0–6.0)

## 2021-10-31 LAB — LACTATE, SEPSIS: Lactic Acid, Sepsis: 0.8 MMOL/L (ref 0.5–2.0)

## 2021-10-31 LAB — POCT GLUCOSE
POC Glucose: 111 mg/dL
POC Glucose: 76 mg/dL

## 2021-10-31 MED ORDER — POLYETHYLENE GLYCOL 3350 17 G PO PACK
17 g | Freq: Every day | ORAL | Status: AC | PRN
Start: 2021-10-31 — End: ?
  Administered 2021-11-01 – 2021-11-03 (×3): 17 g via ORAL

## 2021-10-31 MED ORDER — ONDANSETRON 4 MG PO TBDP
4 MG | Freq: Three times a day (TID) | ORAL | Status: AC | PRN
Start: 2021-10-31 — End: ?

## 2021-10-31 MED ORDER — HEPARIN SODIUM (PORCINE) 5000 UNIT/ML IJ SOLN
5000 | Freq: Three times a day (TID) | INTRAMUSCULAR | Status: DC
Start: 2021-10-31 — End: 2021-11-04
  Administered 2021-11-01 (×3): 5000 [IU] via SUBCUTANEOUS

## 2021-10-31 MED ORDER — DEXTROSE 10 % IV BOLUS
INTRAVENOUS | Status: AC | PRN
Start: 2021-10-31 — End: ?

## 2021-10-31 MED ORDER — FINASTERIDE 5 MG PO TABS
5 MG | Freq: Every day | ORAL | Status: AC
Start: 2021-10-31 — End: ?
  Administered 2021-11-01 – 2021-11-04 (×4): 5 mg via ORAL

## 2021-10-31 MED ORDER — DEXTROSE 10 % IV SOLN
10 % | INTRAVENOUS | Status: AC | PRN
Start: 2021-10-31 — End: ?

## 2021-10-31 MED ORDER — SODIUM CHLORIDE 0.9 % IV SOLN
0.9 % | INTRAVENOUS | Status: AC | PRN
Start: 2021-10-31 — End: ?

## 2021-10-31 MED ORDER — INSULIN LISPRO (1 UNIT DIAL) 100 UNIT/ML SC SOPN
100 UNIT/ML | Freq: Three times a day (TID) | SUBCUTANEOUS | Status: AC
Start: 2021-10-31 — End: ?
  Administered 2021-11-01: 16:00:00 2 [IU] via SUBCUTANEOUS
  Administered 2021-11-01 – 2021-11-03 (×4): 1 [IU] via SUBCUTANEOUS
  Administered 2021-11-03: 16:00:00 3 [IU] via SUBCUTANEOUS
  Administered 2021-11-03: 13:00:00 1 [IU] via SUBCUTANEOUS
  Administered 2021-11-04: 16:00:00 2 [IU] via SUBCUTANEOUS

## 2021-10-31 MED ORDER — GLUCOSE 4 G PO CHEW
4 g | ORAL | Status: AC | PRN
Start: 2021-10-31 — End: ?

## 2021-10-31 MED ORDER — SODIUM CHLORIDE 0.9 % IV SOLN
0.9 % | INTRAVENOUS | Status: AC
Start: 2021-10-31 — End: 2021-10-31
  Administered 2021-10-31: 20:00:00 1000 via INTRAVENOUS

## 2021-10-31 MED ORDER — ACETAMINOPHEN 650 MG RE SUPP
650 | Freq: Four times a day (QID) | RECTAL | Status: DC | PRN
Start: 2021-10-31 — End: 2021-11-04

## 2021-10-31 MED ORDER — SODIUM CHLORIDE 0.9 % IV BOLUS
0.9 % | Freq: Once | INTRAVENOUS | Status: AC
Start: 2021-10-31 — End: 2021-10-31

## 2021-10-31 MED ORDER — SODIUM CHLORIDE 0.9 % IV SOLN
0.9 % | INTRAVENOUS | Status: AC
Start: 2021-10-31 — End: ?
  Administered 2021-10-31 – 2021-11-01 (×2): via INTRAVENOUS

## 2021-10-31 MED ORDER — ONDANSETRON HCL 4 MG/2ML IJ SOLN
4 MG/2ML | Freq: Four times a day (QID) | INTRAMUSCULAR | Status: AC | PRN
Start: 2021-10-31 — End: ?

## 2021-10-31 MED ORDER — TAMSULOSIN HCL 0.4 MG PO CAPS
0.4 MG | Freq: Every day | ORAL | Status: AC
Start: 2021-10-31 — End: ?
  Administered 2021-11-01 – 2021-11-04 (×5): 0.4 mg via ORAL

## 2021-10-31 MED ORDER — STERILE WATER FOR INJECTION (MIXTURES ONLY)
1 | INTRAMUSCULAR | Status: DC
Start: 2021-10-31 — End: 2021-11-03
  Administered 2021-11-01 – 2021-11-02 (×2): 1000 mg via INTRAVENOUS

## 2021-10-31 MED ORDER — CALCIUM GLUCONATE 10 % IV SOLN
10 % | INTRAVENOUS | Status: AC
Start: 2021-10-31 — End: 2021-10-31
  Administered 2021-10-31: 20:00:00 1000 mg via INTRAVENOUS

## 2021-10-31 MED ORDER — ACETAMINOPHEN 325 MG PO TABS
325 | Freq: Four times a day (QID) | ORAL | Status: DC | PRN
Start: 2021-10-31 — End: 2021-11-04

## 2021-10-31 MED ORDER — NORMAL SALINE FLUSH 0.9 % IV SOLN
0.9 % | INTRAVENOUS | Status: AC | PRN
Start: 2021-10-31 — End: ?

## 2021-10-31 MED ORDER — NORMAL SALINE FLUSH 0.9 % IV SOLN
0.9 % | Freq: Two times a day (BID) | INTRAVENOUS | Status: AC
Start: 2021-10-31 — End: ?
  Administered 2021-11-01 – 2021-11-04 (×7): 10 mL via INTRAVENOUS

## 2021-10-31 MED ORDER — INSULIN REGULAR HUMAN 100 UNIT/ML IJ SOLN
100 UNIT/ML | INTRAMUSCULAR | Status: AC
Start: 2021-10-31 — End: 2021-10-31
  Administered 2021-10-31: 20:00:00 10 [IU] via INTRAVENOUS

## 2021-10-31 MED ORDER — GLUCAGON HCL (DIAGNOSTIC) 1 MG IJ SOLR
1 MG | INTRAMUSCULAR | Status: AC | PRN
Start: 2021-10-31 — End: ?

## 2021-10-31 MED ORDER — INSULIN LISPRO (1 UNIT DIAL) 100 UNIT/ML SC SOPN
100 UNIT/ML | Freq: Every evening | SUBCUTANEOUS | Status: AC
Start: 2021-10-31 — End: ?

## 2021-10-31 MED ORDER — CEFTRIAXONE SODIUM 1 G IJ SOLR
1 g | INTRAMUSCULAR | Status: AC
Start: 2021-10-31 — End: 2021-10-31
  Administered 2021-10-31: 20:00:00 1000 mg via INTRAVENOUS

## 2021-10-31 MED FILL — SODIUM CHLORIDE 0.9 % IV SOLN: 0.9 % | INTRAVENOUS | Qty: 1000

## 2021-10-31 MED FILL — CALCIUM GLUCONATE 10 % IV SOLN: 10 % | INTRAVENOUS | Qty: 10

## 2021-10-31 MED FILL — CEFTRIAXONE SODIUM 1 G IJ SOLR: 1 g | INTRAMUSCULAR | Qty: 1000

## 2021-10-31 MED FILL — SODIUM CHLORIDE 0.9 % IV SOLN: 0.9 % | INTRAVENOUS | Qty: 50

## 2021-10-31 MED FILL — BD POSIFLUSH 0.9 % IV SOLN: 0.9 % | INTRAVENOUS | Qty: 40

## 2021-10-31 MED FILL — DEXTROSE 10 % IV SOLN: 10 % | INTRAVENOUS | Qty: 1000

## 2021-10-31 MED FILL — HUMALOG KWIKPEN 100 UNIT/ML SC SOPN: 100 UNIT/ML | SUBCUTANEOUS | Qty: 3

## 2021-10-31 NOTE — ED Notes (Signed)
Handoff report given to [facility] RN      [name], RN  10/31/21 [phone]

## 2021-10-31 NOTE — ED Triage Notes (Signed)
Pt. Presents A&O and c/o of weakness with recurrent UTI's for the past 3 months. Pt. Has been on multiple abx tx's including- Cefpodoxime, Macrobid, and Augmentin over the last 3 months and pt. States "I am just getting worse." He is c/o increased fatigue, weakness, cough, intermittent chills and sweats as well as "milky urine" with sediment. Pt. Is concerned for Sepsis. HR- 71, T-98.4. Pt. Is a truckdriver for occupation.

## 2021-10-31 NOTE — ED Notes (Signed)
Pt reporting feeling "jittery" and states he thinks his BGL is low. BGL obtained and is 76, provider notified. Given orange juice- will continue to monitor       [name], RN  10/31/21 [phone]

## 2021-10-31 NOTE — ED Provider Notes (Signed)
HPI    Dominic Stephens is a 66 y/o male with h/o HTN, bladder stones, and an infected left foot wound requiring hospitalization in 2019 comes in now with symptoms of persistent urinary frequency, urgency, and cloudy urine with urinalysis growing Enterobacter cloacae 08/2021 initially placed on cefpodoxime to which the bacteria was susceptible.  His symptoms persisted beyond the length of the antibiotic and he was placed on 10 days of Macrobid which culture showed should have been susceptible.  After completion of the medication, he continued with symptoms.  He had some old Augmentin and took 7 days of that.  He never experienced resolution of his symptoms.  His antibiotics were completed approximately 3 weeks ago.  He has not developed any new abdominal pain or back pain.  He has not had fever or chills.  He is coming in today because over the course of the past 2-3 months he has had progressive and now profound fatigue, as had a progressive decrease in appetite with poor oral intake, and reports a 40 lb weight loss.  Two months ago he developed a dry cough and reports postnasal drip.  No shortness of breath, chest pain, orthopnea, peripheral edema.  He is a never smoker.  He denies alcohol or street drugs.  Denies dark black stools.    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    Past Surgical History:   Procedure Laterality Date    ORTHOPEDIC SURGERY      Lumbar discecomy 2001       Social History     Socioeconomic History     Marital status: Single     Spouse name: Not on file    Number of children: Not on file    Years of education: Not on file    Highest education level: Not on file   Occupational History    Not on file   Tobacco Use    Smoking status: Never    Smokeless tobacco: Never   Substance and Sexual Activity    Alcohol use: Not on file    Drug use: Not on file    Sexual activity: Not on file   Other Topics Concern    Not on file   Social History Narrative    Not on file     Social Determinants of Health     Financial Resource Strain: Not on file   Food Insecurity: Not on file   Transportation Needs: Not on file   Physical Activity: Not on file   Stress: Not on file   Social Connections: Not on file   Intimate Partner Violence: Not on file   Housing Stability: Not on file       Family History:  Family History   Problem Relation Age of Onset    Hypertension Mother     Diabetes Father     Diabetes Mother  Medications:  Patient medication list reviewed  This patient does not have an active medication from one of the medication groupers.    Allergies:    Methocarbamol, Sulfa antibiotics, and Penicillins    Review of Systems   Constitutional:  Positive for activity change, appetite change, fatigue and unexpected weight change. Negative for fever.   HENT:  Negative for congestion, postnasal drip, rhinorrhea and sore throat.    Eyes:  Negative for redness.   Respiratory:  Positive for cough. Negative for shortness of breath.    Cardiovascular:  Negative for chest pain and leg swelling.   Gastrointestinal:  Negative for abdominal distention, abdominal pain, constipation and diarrhea.        Few episodes of nausea and vomiting, not persistent   Endocrine: Negative for polyuria.   Genitourinary:  Positive for frequency and urgency. Negative for dysuria and flank pain.   Musculoskeletal:  Negative for arthralgias and myalgias.   Skin:  Negative for rash.   Allergic/Immunologic: Negative for immunocompromised state.   Neurological:   Positive for weakness. Negative for dizziness and headaches.   Hematological:  Negative for adenopathy. Does not bruise/bleed easily.   Psychiatric/Behavioral:  Negative for agitation and confusion.          Vital Signs for this visit:  Vitals:    10/31/21 1222 10/31/21 1226 10/31/21 1645   BP: 123/64     Pulse: 71  65   Resp: 16  23   Temp: 98.4 F (36.9 C)     TempSrc: Oral     SpO2: 98%     Weight:  281 lb 8 oz (127.7 kg)    Height: 6\' 5"  (1.956 m)           Physical Exam  Vitals and nursing note reviewed. Exam conducted with a chaperone present.   Constitutional:       General: He is not in acute distress.     Appearance: He is well-developed. He is ill-appearing. He is not diaphoretic.      Comments: This is a weary-appearing 66 year old male seated on gurney in no acute distress   HENT:      Head: Normocephalic and atraumatic.      Mouth/Throat:      Mouth: Mucous membranes are moist.      Pharynx: Oropharynx is clear.   Eyes:      Extraocular Movements: Extraocular movements intact.      Pupils: Pupils are equal, round, and reactive to light.   Cardiovascular:      Rate and Rhythm: Normal rate and regular rhythm.      Pulses: Normal pulses.   Pulmonary:      Effort: Pulmonary effort is normal. No respiratory distress.      Breath sounds: Normal breath sounds.   Abdominal:      Palpations: Abdomen is soft.      Tenderness: There is no abdominal tenderness. There is no right CVA tenderness or left CVA tenderness.      Comments: Protuberant   Musculoskeletal:         General: Normal range of motion.      Cervical back: Normal range of motion and neck supple.      Right lower leg: No edema.      Left lower leg: No edema.   Skin:     General: Skin is warm and dry.      Capillary Refill: Capillary refill takes less than 2 seconds.   Neurological:  General: No focal deficit present.      Mental Status: He is alert and oriented to person, place, and time.   Psychiatric:         Mood and Affect: Mood normal.          Behavior: Behavior normal.         Procedures      Medical Decision Making  This 66 year old male comes in concerned over lingering symptoms of urinary frequency, urgency, and cloudy urine for the past 2-3 months with initial urine cultures growing Enterobacter cloacae care for which she took cefpodoxime which cultures stated should have been susceptible, following that with persistent symptoms was placed on 10 days of Macrobid which he did complete.  Had no resolution of symptoms and took some Augmentin he happened to have at home for 7 days.  He completed his last course of antibiotics approximately 3 weeks ago and has not had any deviation of his symptoms.  Incidentally, he tells me he has history of bladder stones which were never removed.  Has not had any abdominal pain or flank pain.  Has not had fever or chills.      Concern only, he tells me that he has had progressive decreased appetite, lethargy/fatigue, and reports a 40 lb weight loss over the past 2 months.  Has had postnasal drip and a dry cough.  He is a never smoker.  No history of known seasonal allergies.  Denies alcohol or street drugs.  No dark black stools.  No history of sexually transmitted infections.  Has not had skin rash.    Differential diagnosis includes but not limited to:  Resistant urinary tract infection, seeded persistent bladder stones, , considered electrolyte abnormality, sepsis, underlying malignancy.    Amount and/or Complexity of Data Reviewed  Independent Historian: spouse  Labs: ordered. Decision-making details documented in ED Course.  Radiology: ordered. Decision-making details documented in ED Course.  ECG/medicine tests: ordered.    Risk  OTC drugs.  Prescription drug management.  Decision regarding hospitalization.              Lab orders and findings that I have personally reviewed and interpreted during this visit.  Only abnormal values will be noted:  Abnormal Labs Reviewed   COMPREHENSIVE METABOLIC PANEL -  Abnormal; Notable for the following components:       Result Value    Sodium 132 (*)     Potassium 5.7 (*)     CO2 19 (*)     Glucose 153 (*)     BUN 61 (*)     Creatinine 2.57 (*)     Bun/Cre Ratio 24 (*)     Est, Glom Filt Rate 27 (*)     All other components within normal limits   CBC WITH AUTO DIFFERENTIAL - Abnormal; Notable for the following components:    WBC 13.7 (*)     RBC 3.77 (*)     Hemoglobin 10.9 (*)     Hematocrit 34.4 (*)     MCHC 31.7 (*)     MPV 8.7 (*)     Segs Absolute 10.2 (*)     Absolute Mono # 1.3 (*)     Absolute Immature Granulocyte 0.2 (*)     All other components within normal limits   URINALYSIS WITH REFLEX TO CULTURE - Abnormal; Notable for the following components:    Appearance TURBID (*)     Protein, Urine 100 (*)     Blood,  Urine MODERATE (*)     Leukocyte Esterase, Urine LARGE (*)     All other components within normal limits   URINE MICROSCOPIC WITH REFLEX TO CULTURE - Abnormal; Notable for the following components:    BACTERIA, URINE 4+ (*)     All other components within normal limits       Recent radiology studies including this visit.  I have personally reviewed these studies and my personal interpretation, if available, is documented in the ED Course:  CT ABDOMEN PELVIS WO CONTRAST Additional Contrast? None   Final Result   1. Bilateral hydronephrosis and hydroureter. There is cortical thinning of the right kidney consistent with atrophy. The urinary bladder is significantly distended. This is likely related to bladder outlet obstruction.   2. Prostate appears to be enlarged. There are calcifications in the prostate. It is difficult to determine the relationship of the prostate calcification to the urethra.   3. Small nonobstructive stone lower pole left kidney. Stone in the dependent portion of the urinary bladder.         XR CHEST (2 VW)   Final Result   1. There is no evidence for acute cardiopulmonary disease.             Medications given in the ED:  Medications    sodium chloride 0.9 % bolus 1,000 mL (1,000 mLs IntraVENous New Bag 10/31/21 1540)   insulin regular (HUMULIN R;NOVOLIN R) injection 10 Units (10 Units IntraVENous Given 10/31/21 1532)   calcium gluconate 10 % injection 1,000 mg (1,000 mg IntraVENous Given 10/31/21 1532)   cefTRIAXone (ROCEPHIN) 1,000 mg in sterile water 10 mL IV syringe (1,000 mg IntraVENous Given 10/31/21 1532)       Diagnosis:  1. AKI (acute kidney injury) (HCC)    2. Hyperkalemia    3. Urinary tract infection without hematuria, site unspecified    4. Supraventricular bigeminy    5. Urinary retention        ED Course:  ED Course as of 10/31/21 1720   Wed Oct 31, 2021   1358 Twelve lead EKG reviewed.  No prior for comparison.  The sinus with supraventricular bigeminy.  There are no acute ST changes, minimal elevation V2.  No abnormal Q-wave pattern.  Normal axis. [LC]   1429 Metabolic panel shows potassium 5.7 with new kidney injury as far as I can tell last comparison 2020 creatinine 2.57 BUN 61 GFR 27.      I did contact the lab.  This was not hemolyzed specimen, did not suspected since he does have kidney injury.  1 L IV fluids ordered.  10 units IV insulin followed by a g of calcium gluconate with instructions to hang immediately upon administration of insulin ordered.  Rocephin 1 g IV ordered for the UTI.  The Enterobacter that grew out in August was susceptible to 3rd generation cephalosporins.    Noncontrast CT ordered for weight loss and the newly identified renal failure.  Anticipate admission, thereafter.   [LC]      ED Course User Index  [LC] Bethann Goo, PA       CT results as above.  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  However, there is significant bladder distension.  Therefore, there could be a component of post renal to the patient's renal function.  Foley catheter ordered.      Hospitalist service will be paged for admission    Please note that portions of this document  were created using the  M*Modal Fluency Direct dictation system.  Any inconsistencies or typographical errors may be the result of mis-transcription that persist in spite of proof-reading and should be addressed with the document creator.       Bethann Goo, [place]  10/31/21 1722

## 2021-10-31 NOTE — H&P (Signed)
History & Physical     Primary Care Provider: Karie Soda, MD  Date of service: 10/31/21    History of Presenting Illness:     Dominic Stephens is a 66 y.o. male with PMH of HTN on meds, BPH no longer taking meds, DM-2 no longer taking meds, renal calculi and recently experienced symptoms of persistent urinary frequency, urgency, and cloudy urine. He had a urinalysis growing Enterobacter cloacae 08/2021 initially placed on cefpodoxime to which the bacteria was susceptible.  His symptoms persisted beyond the length of the antibiotic and he was placed on 10 days of Macrobid which culture showed should have been susceptible.  After completion of the medication, he continued with symptoms.  He had some old Augmentin and took 7 days of that.  He never experienced resolution of his symptoms.  His antibiotics were completed approximately 3 weeks ago.  He has not developed any new abdominal pain or back pain.  He has not had fever or chills.  He is coming in today because over the course of the past 2-3 months he has had progressive and now profound fatigue, and had a progressive decrease in appetite with poor oral intake, and reports a 40 lb weight loss.  Two months ago he developed a dry cough and reports postnasal drip.  No shortness of breath, chest pain, orthopnea, peripheral edema.  No N/V, diarrhea or constipation.  Denies any current urinary complaints. He is a never smoker.  He denies alcohol or street drugs. Independent at baseline.  Stopped taking his BPH (flomax and finasteride) and DM meds (metformin) of his own accord many years ago.  He doesn't routinely follow up with a PCP and hasn't seen his urologist recently either.    Family Hx reviewed as below.    In the ED, given IV Ceftriaxone with abnormal UA, 1 L NS bolus with AKI, IV Ca gluconate and regular Insulin with hyperkalemia. CT abdomen/pelvis positive as below with foley catheter ordered to be placed.    No past medical history on file.   Past  Surgical History:   Procedure Laterality Date    ORTHOPEDIC SURGERY      Lumbar discecomy 2001     Prior to Admission medications    Medication Sig Start Date End Date Taking? Authorizing Provider   amLODIPine (NORVASC) 10 MG tablet TAKE 1 TABLET BY MOUTH ONCE DAILY 04/26/19   Automatic Reconciliation, Ar   atenolol (TENORMIN) 50 MG tablet TAKE 1 TABLET BY MOUTH TWICE DAILY 04/26/19   Automatic Reconciliation, Ar   lisinopril (PRINIVIL;ZESTRIL) 5 MG tablet Take 5 mg by mouth daily 04/26/19   Automatic Reconciliation, Ar     Allergies   Allergen Reactions    Methocarbamol Other (See Comments)    Sulfa Antibiotics Myalgia    Penicillins Rash     Patient states he can take other medications in this family      Family History   Problem Relation Age of Onset    Hypertension Mother     Diabetes Father     Diabetes Mother       Social History     Socioeconomic History    Marital status: Single     Spouse name: Not on file    Number of children: Not on file    Years of education: Not on file    Highest education level: Not on file   Occupational History    Not on file   Tobacco Use  Smoking status: Never    Smokeless tobacco: Never   Substance and Sexual Activity    Alcohol use: Not on file    Drug use: Not on file    Sexual activity: Not on file   Other Topics Concern    Not on file   Social History Narrative    Not on file     Social Determinants of Health     Financial Resource Strain: Not on file   Food Insecurity: Not on file   Transportation Needs: Not on file   Physical Activity: Not on file   Stress: Not on file   Social Connections: Not on file   Intimate Partner Violence: Not on file   Housing Stability: Not on file     Full code    REVIEW OF SYSTEMS:       [x]  All systems reviewed and negative except as above in HPI      []  Unable to obtain  ROS due to  [] mental status change  [] sedated   [] intubated      Objective:     Physical Exam:     Vitals:    10/31/21 1645   BP:    Pulse: 65   Resp: 23   Temp:    SpO2:            General:  Patient alert and oriented, pleasant, no acute distress  Head:  Atraumatic, normocephalic  Eyes:  EOMI, no scleral injection, no icterus. PERRL.    Ears:  Symmetric without discharge  Nose:  Symmetric without discharge  Oropharynx:  Pink without erythema, exudate, tonsillar enlargement.  Mucous membranes slightly dry.  Neck:  Trachea midline, no JVD  Cardiovascular:  Regular rate and rhythm, no murmurs rubs gallops or clicks.  Peripheral pulses palpable bilaterally. No edema.  Lungs:  Clear to auscultation bilaterally  Chest:  Symmetric with respirations  Abdomen:  Positive bowel sounds, soft, minimal tenderness in LUQ and right CVA, nondistended, no rigidity, masses, guarding, rebound.  Extremities:  No cyanosis or clubbing, no gross joint deformity.  Neuro: Cranial 2 through 12 are grossly intact. No gross focal deficits.        Clinical results:    Recent Labs     10/31/21  1338   WBC 13.7*   HGB 10.9*   HCT 34.4*   PLT 346     Recent Labs     10/31/21  1338   NA 132*   K 5.7*   CL 101   CO2 19*   BUN 61*   ALT 19     No results for input(s): "PH", "PCO2", "PO2", "HCO3", "FIO2" in the last 72 hours.  Recent Results (from the past 24 hour(s))   EKG 12 Lead    Collection Time: 10/31/21 12:00 AM   Result Value Ref Range    Heart Rate 71 bpm    EKG RR INTERVAL 845 ms    Atrial Rate 35 ms    P-R Interval 186 ms    EKG P DURATION 119 ms    EKG P HORIZONTAL AXIS 34 deg    EKG P FRONT AXIS 10 deg    EKG Q ONSET 502 ms    EKG QRSD INTERVAL 82 ms    Q-T Interval 361 ms    EKG QTCB 393 ms    EKG QTCF 382 ms    EKG QRS HORIZONTAL AXIS 9 deg    EKG QRS AXIS 42 deg    EKG  I-40 HORIZONTAL AXIS 69 deg    EKG I-40 FRONT AXIS 34 deg    EKG T-40 HORIZONTAL AXIS -3 deg    EKG T-40 FRONT AXIS 44 deg    EKG T HORIZONTAL AXIS 66 deg    EKG T WAVE AXIS 36 deg    EKG S-T HORIZONTAL AXIS 74 deg    EKG S-T FRONT AXIS 36 deg   Hemoglobin A1C    Collection Time: 10/31/21  1:36 PM   Result Value Ref Range    Hemoglobin A1C  10.6 (H) 4.0 - 6.0 %    eAG 258 mg/dL   Comprehensive Metabolic Panel    Collection Time: 10/31/21  1:38 PM   Result Value Ref Range    Sodium 132 (L) 136 - 145 mmol/L    Potassium 5.7 (H) 3.5 - 5.1 mmol/L    Chloride 101 98 - 107 mmol/L    CO2 19 (L) 22 - 29 mmol/L    Anion Gap 12 5 - 15 mmol/L    Glucose 153 (H) 74 - 109 mg/dL    BUN 61 (H) 8 - 23 MG/DL    Creatinine 2.57 (H) 0.67 - 1.17 MG/DL    Bun/Cre Ratio 24 (H) 12 - 20    Est, Glom Filt Rate 27 (L) >60 ml/min/1.39m    Calcium 9.3 8.8 - 10.2 MG/DL    Total Bilirubin 0.38 0 - 1.20 mg/dL    ALT 19 0 - 50 U/L    AST 17 0 - 50 U/L    Alk Phosphatase 106 40 - 129 U/L    Total Protein 7.5 6.4 - 8.3 g/dL    Albumin 3.7 3.5 - 5.2 g/dL    Albumin/Globulin Ratio 1.0 1.0 - 3.0     CBC with Auto Differential    Collection Time: 10/31/21  1:38 PM   Result Value Ref Range    WBC 13.7 (H) 4.0 - 10.0 K/uL    RBC 3.77 (L) 4.51 - 5.93 M/uL    Hemoglobin 10.9 (L) 13.7 - 17.5 g/dL    Hematocrit 34.4 (L) 40.1 - 51.0 %    MCV 91.2 80.0 - 95.0 FL    MCH 28.9 25.6 - 32.2 PG    MCHC 31.7 (L) 32.2 - 35.5 g/dL    RDW 13.2 11.6 - 14.4 %    Platelets 346 150 - 400 K/uL    MPV 8.7 (L) 9.4 - 12.4 FL    Nucleated RBCs 0.0 0 - 0.02 PER 100 WBC    nRBC 0.00 K/uL    Seg Neutrophils 74 %    Lymphocytes 13 %    Monocytes 10 %    Eosinophils % 1 %    Basophils 0 %    Immature Granulocytes 2 %    Segs Absolute 10.2 (H) 1.6 - 6.1 K/UL    Absolute Lymph # 1.8 1.2 - 3.7 K/UL    Absolute Mono # 1.3 (H) 0.2 - 0.8 K/UL    Absolute Eos # 0.2 0.0 - 0.5 K/UL    Basophils Absolute 0.1 0.0 - 0.1 K/UL    Absolute Immature Granulocyte 0.2 (H) 0.00 - 0.03 K/UL    Differential Type AUTOMATED    Urinalysis with Reflex to Culture    Collection Time: 10/31/21  1:38 PM    Specimen: Urine   Result Value Ref Range    Color, UA YELLOW YELLOW    Appearance TURBID (A) CLEAR    Specific Gravity, Urine 1.009 1.001 -  1.035    pH, Urine 6.0 5 - 8    Protein, Urine 100 (A) Negative mg/dL    Glucose, Ur Negative Negative  mg/dL    Ketones, Urine Negative Negative mg/dL    Bilirubin Urine Negative Negative    Blood, Urine MODERATE (A) Negative    Urobilinogen, Urine 0.2 0.2 - 1.0 EU/dL    Nitrite, Urine Negative Negative    Leukocyte Esterase, Urine LARGE (A) Negative   Lactate, Sepsis    Collection Time: 10/31/21  1:38 PM   Result Value Ref Range    Lactic Acid, Sepsis 0.8 0.5 - 2.0 MMOL/L   TSH    Collection Time: 10/31/21  1:38 PM   Result Value Ref Range    TSH 1.570 0.270 - 4.200 uIU/mL   Urine Microscopic with Reflex to Culture    Collection Time: 10/31/21  1:38 PM   Result Value Ref Range    WBC, UA 50-100 <5 /hpf    RBC, UA 5-10 <4 /hpf    BACTERIA, URINE 4+ (A) NONE SEEN /hpf    Comment: URINE CULTURE ORDERED     Epithelial Cells UA NONE SEEN <5 /hpf    Epithelial Cells UA SQUAMOUS <5 /hpf    Hyaline Casts, UA NONE SEEN /lpf   POCT Glucose    Collection Time: 10/31/21  4:05 PM   Result Value Ref Range    POC Glucose 111 mg/dL    Performed by: Brunilda Payor    POCT Glucose    Collection Time: 10/31/21  5:03 PM   Result Value Ref Range    POC Glucose 76 mg/dL    Performed by: Emilia Beck        EKG:  NSR, supraventricular bigeminy.    Imaging:   CT ABDOMEN PELVIS WO CONTRAST Additional Contrast? None  Narrative: EXAM:  CT ABD PELV WO CONT    HISTORY:  40 pound weight loss, fatigue, new renal failure    COMPARISON:  None.    TECHNIQUE:  CT imaging of the abdomen and pelvis is performed without contrast. Images are reviewed with lung, soft tissue, and bone window settings. Images are reviewed in the coronal, axial, and sagittal planes.  This CT exam was performed using one or more of the following dose reduction techniques: Automated exposure control, adjustment of the mA and/or kV according to patient size, or use of iterative reconstruction technique.    FINDINGS:  Lower thorax: Unremarkable    CT ABDOMEN:    Liver: Unremarkable    Gallbladder and biliary system: Unremarkable    Spleen: Normal sized    Pancreas: The pancreas  and pancreatic duct are normal.    Adrenal glands: No mass bilaterally.    Kidneys and ureters: Moderate to severe right hydronephrosis. Right renal cortical thinning. Moderate left hydronephrosis. Small nonobstructive stone lower pole left kidney. Bilateral hydroureter. There is perinephric stranding around both kidneys.    Appendix: No CT evidence of appendicitis.    Stomach and bowel: Unremarkable.    Peritoneal cavity: No free air or ascites.    Lymph nodes: No lymphadenopathy.    Vasculature: Normal caliber.    Osseous structures: Normal development, no acute process.    Extra abdominal soft tissues: Unremarkable.    CT PELVIS:    Reproductive: The prostate is enlarged. There are calcifications in the prostate..    Rectosigmoid: Unremarkable.    Bladder and distal ureters: The urinary bladder is significantly distended. There is a stone in the dependent portion  of the urinary bladder.    Extra pelvic soft tissues: No hernia.  Impression: 1. Bilateral hydronephrosis and hydroureter. There is cortical thinning of the right kidney consistent with atrophy. The urinary bladder is significantly distended. This is likely related to bladder outlet obstruction.  2. Prostate appears to be enlarged. There are calcifications in the prostate. It is difficult to determine the relationship of the prostate calcification to the urethra.  3. Small nonobstructive stone lower pole left kidney. Stone in the dependent portion of the urinary bladder.  XR CHEST (2 VW)  Narrative: EXAM:  XR CHEST PA LAT    HISTORY:  cough, 40 lb weight loss, fatigue    COMPARISON:  None.    TECHNIQUE:  PA and lateral views of the chest were done.    FINDINGS:  The lungs are clear and the pulmonary vascularity is normal. Heart, hila, and mediastinum are normal. Costophrenic angles are sharp. No evidence for pneumothorax. Prominent degenerative changes are seen in the T-spine. Degenerative changes right glenohumeral joint and right AC joint.  Impression:  1. There is no evidence for acute cardiopulmonary disease.        Assessment and Plan:     AKI and bilateral hydronephrosis 2/2 obstructive uropathy in the setting of known BPH.    - CT positive as above  - IVF NS at 125 ml/hr  - foley placement  - urology consult in AM  - resume flomax and finasteride  - hold lisinopril  - treat underlying UTI as below  - check PSA per patient request  - follow BMP and urine output, repeat Cr this evening improving    UTI with non-obstructing renal/bladder calculi    - UA positive, f/u culture  - IV Ceftriaxone   - remainder of management as above    Hyperkalemia    - s/p acute management in ED, repeat BMP shows normalized K, monitor  - tele    Mild hyponatremia    - IVF as above, repeat BMP showing improved Na, monitor    HTN    - hold lisinopril given AKI  - monitor BP trend, if elevated resume Norvasc/Atenolol will hold off for now    Hx of DM-2    - not taking meds, check HbA1c  - ISS and diabetic diet  - would not restart prior metformin given AKI    DVT ppx with SC Heparin    Full code    Inpatient admission status given complexity of case and need > 2 midnights.    Updated spouse at bedside, all questions answered.      Medication Reconciliation Documentation:     I have utilized all available immediate resources to obtain, update, or review the patient's current medications.     Advanced Care Plan Documentation:    [x]  I confirmed that the patient's Advance Care Plan is present, code status is documented, or surrogate decision maker is listed in the patient's medical record.    []  The patient's Advance Care plan is not present because:     []  I confirmed today that the patient does not wish or was not able to name a surrogate decision maker or provide an [place].  []  Hospice care is currently being provided or has been provided this calendar year.    []  I did NOT confirm today the presence of an [place] or surrogate decision maker documented within the  patient's medical record.     Total time spent in  evaluation management and admission more than 60 min.    Signed By: Valma Cava, MD     October 31, 2021

## 2021-11-01 LAB — BASIC METABOLIC PANEL
Anion Gap: 12 mmol/L (ref 5–15)
Anion Gap: 12 mmol/L (ref 5–15)
BUN: 58 MG/DL — ABNORMAL HIGH (ref 8–23)
BUN: 52 MG/DL — ABNORMAL HIGH (ref 8–23)
Bun/Cre Ratio: 24 — ABNORMAL HIGH (ref 12–20)
Bun/Cre Ratio: 26 — ABNORMAL HIGH (ref 12–20)
CO2: 18 mmol/L — ABNORMAL LOW (ref 22–29)
CO2: 16 mmol/L — ABNORMAL LOW (ref 22–29)
Calcium: 9.3 MG/DL (ref 8.8–10.2)
Calcium: 8.8 MG/DL (ref 8.8–10.2)
Chloride: 104 mmol/L (ref 98–107)
Chloride: 105 mmol/L (ref 98–107)
Creatinine: 2.37 MG/DL — ABNORMAL HIGH (ref 0.67–1.17)
Creatinine: 2.02 MG/DL — ABNORMAL HIGH (ref 0.67–1.17)
Est, Glom Filt Rate: 30 mL/min/{1.73_m2} — ABNORMAL LOW (ref >60)
Est, Glom Filt Rate: 36 mL/min/{1.73_m2} — ABNORMAL LOW (ref >60)
Glucose: 164 mg/dL — ABNORMAL HIGH (ref 74–109)
Glucose: 259 mg/dL — ABNORMAL HIGH (ref 74–109)
Potassium: 4.9 mmol/L (ref 3.5–5.1)
Potassium: 5.5 mmol/L — ABNORMAL HIGH (ref 3.5–5.1)
Sodium: 134 mmol/L — ABNORMAL LOW (ref 136–145)
Sodium: 133 mmol/L — ABNORMAL LOW (ref 136–145)

## 2021-11-01 LAB — PSA, TOTAL AND FREE
PSA, Free: 0.28 ng/mL (ref 0–4)
PSA: 1.5 ng/mL (ref 0–4.00)

## 2021-11-01 LAB — CBC
Hematocrit: 31.4 % — ABNORMAL LOW (ref 40.1–51.0)
Hemoglobin: 9.9 g/dL — ABNORMAL LOW (ref 13.7–17.5)
MCH: 28.9 PG (ref 25.6–32.2)
MCHC: 31.5 g/dL — ABNORMAL LOW (ref 32.2–35.5)
MCV: 91.8 FL (ref 80.0–95.0)
MPV: 9.3 FL — ABNORMAL LOW (ref 9.4–12.4)
Nucleated RBCs: 0 PER 100 WBC (ref 0–0.02)
Platelets: 285 10*3/uL (ref 150–400)
RBC: 3.42 M/uL — ABNORMAL LOW (ref 4.51–5.93)
RDW: 13.2 % (ref 11.6–14.4)
WBC: 11.9 10*3/uL — ABNORMAL HIGH (ref 4.0–10.0)
nRBC: 0 10*3/uL

## 2021-11-01 LAB — POCT GLUCOSE
POC Glucose: 176 mg/dL
POC Glucose: 278 mg/dL
POC Glucose: 234 mg/dL

## 2021-11-01 MED ORDER — SODIUM POLYSTYRENE SULFONATE 15 GM/60ML PO SUSP
15 GM/60ML | Freq: Once | ORAL | Status: AC
Start: 2021-11-01 — End: 2021-11-01
  Administered 2021-11-01: 14:00:00 15 g via ORAL

## 2021-11-01 MED FILL — PEG 3350 17 G PO PACK: 17 g | ORAL | Qty: 1

## 2021-11-01 MED FILL — HEPARIN SODIUM (PORCINE) 5000 UNIT/ML IJ SOLN: 5000 UNIT/ML | INTRAMUSCULAR | Qty: 1

## 2021-11-01 MED FILL — FINASTERIDE 5 MG PO TABS: 5 MG | ORAL | Qty: 1

## 2021-11-01 MED FILL — CEFTRIAXONE SODIUM 1 G IJ SOLR: 1 g | INTRAMUSCULAR | Qty: 1000

## 2021-11-01 MED FILL — SPS 15 GM/60ML PO SUSP: 15 GM/60ML | ORAL | Qty: 60

## 2021-11-01 MED FILL — TAMSULOSIN HCL 0.4 MG PO CAPS: 0.4 MG | ORAL | Qty: 1

## 2021-11-01 NOTE — Progress Notes (Signed)
Hospitalist Progress Note    NAME:  Dominic Stephens   DOB:   1955-10-21   MRN:   [id number]     Date/Time:  11/01/2021  Subjective:     No acute events overnight   Sitting up at the edge of the bed, wife present   No new complaints     All other systems reviewed and negative      Past Medical History and Social history reviewed. No changes.   [x] Current Medication list and allergies reviewed*    Objective:   Vitals:  BP (!) 106/52   Pulse 80   Temp 98.3 F (36.8 C) (Temporal)   Resp 17   Ht 6\' 5"  (1.956 m)   Wt 281 lb 8 oz (127.7 kg)   SpO2 96%   BMI 33.38 kg/m   Height: 6\' 5"  (1.956 m), Weight - Scale: 281 lb 8 oz (127.7 kg), BP: (!) 106/52   Temp (24hrs), Avg:97.7 F (36.5 C), Min:96.8 F (36 C), Max:98.3 F (36.8 C)    @FLOWBSHSIAMB (1819)@  Weight change:             Last 24hr Input/Output:  @RR10LAST3 @    Intake/Output Summary (Last 24 hours) at 11/01/2021 1823  Last data filed at 11/01/2021 1305  Gross per 24 hour   Intake 850 ml   Output 6500 ml   Net -5650 ml            @IO @  @LAST3WEIGHTIP @    No results found for: "GLU", "GLUCPOC"    No results found for: "INR", "PTMR", "PT1"          PHYSICAL EXAM:   General:     Alert, coherent, cooperative, no distress.     HEENT:           Normocephalic, atraumatic   Lungs:             Clear to auscultation, no rales, rhonchi or wheezing.  Heart:   Normal cardiac rate and rhythm, normal heart sounds.  Abdomen:   Soft, non-tender, normoactive bowel sounds  Genitourinary: Foley catheter in place  Extremities:  Spontaneous movement of extremities  Skin:     Warm and dry   Neurologic: AAOx3     Psych:  Mood and affect congruent       Lab Data Reviewed:  No components found for: "[facility]"  Recent Labs     10/31/21  1338 10/31/21  2028 11/01/21  0318   NA 132* 134* 133*   K 5.7* 4.9 5.5*   CL 101 104 105   CO2 19* 18* 16*   BUN 61* 58* 52*   WBC 13.7*  --  11.9*   HGB 10.9*  --  9.9*   HCT 34.4*  --  31.4*   PLT 346  --  285       Recent Results (from the past 24 hour(s))    Basic Metabolic Panel    Collection Time: 10/31/21  8:28 PM   Result Value Ref Range    Sodium 134 (L) 136 - 145 mmol/L    Potassium 4.9 3.5 - 5.1 mmol/L    Chloride 104 98 - 107 mmol/L    CO2 18 (L) 22 - 29 mmol/L    Anion Gap 12 5 - 15 mmol/L    Glucose 164 (H) 74 - 109 mg/dL    BUN 58 (H) 8 - 23 MG/DL    Creatinine (H) 0.67 - 1.17 MG/DL    Bun/Cre  Ratio 24 (H) 12 - 20    Est, Glom Filt Rate 30 (L) >60 ml/min/1.53m2    Calcium 9.3 8.8 - 10.2 MG/DL   CBC    Collection Time: 11/01/21  3:18 AM   Result Value Ref Range    WBC 11.9 (H) 4.0 - 10.0 K/uL    RBC 3.42 (L) 4.51 - 5.93 M/uL    Hemoglobin 9.9 (L) 13.7 - 17.5 g/dL    Hematocrit 29.5 (L) 40.1 - 51.0 %    MCV 91.8 80.0 - 95.0 FL    MCH 28.9 25.6 - 32.2 PG    MCHC 31.5 (L) 32.2 - 35.5 g/dL    RDW 18.8 41.6 - 60.6 %    Platelets 285 150 - 400 K/uL    MPV 9.3 (L) 9.4 - 12.4 FL    Nucleated RBCs 0.0 0 - 0.02 PER 100 WBC    nRBC 0.00 K/uL   Basic Metabolic Panel    Collection Time: 11/01/21  3:18 AM   Result Value Ref Range    Sodium 133 (L) 136 - 145 mmol/L    Potassium 5.5 (H) 3.5 - 5.1 mmol/L    Chloride 105 98 - 107 mmol/L    CO2 16 (L) 22 - 29 mmol/L    Anion Gap 12 5 - 15 mmol/L    Glucose 259 (H) 74 - 109 mg/dL    BUN 52 (H) 8 - 23 MG/DL    Creatinine 3.01 (H) 0.67 - 1.17 MG/DL    Bun/Cre Ratio 26 (H) 12 - 20    Est, Glom Filt Rate 36 (L) >60 ml/min/1.11m2    Calcium 8.8 8.8 - 10.2 MG/DL   PSA, Total and Free    Collection Time: 11/01/21  3:18 AM   Result Value Ref Range    PSA, Free 0.28 0 - 4 ng/mL    PSA 1.50 0 - 4.00 ng/mL    PSA, Free Pct  %     Ratio not calculated because clinical usefulness is not defined except in range of total PSA 4.0-10.0 ng/mL.   POCT Glucose    Collection Time: 11/01/21  7:43 AM   Result Value Ref Range    POC Glucose 176 mg/dL    Performed by: Wyvonnia Dusky    POCT Glucose    Collection Time: 11/01/21 11:54 AM   Result Value Ref Range    POC Glucose 278 mg/dL    Performed by: Wyvonnia Dusky    POCT Glucose    Collection Time:  11/01/21  4:40 PM   Result Value Ref Range    POC Glucose 234 mg/dL    Performed by: Konrad Felix        Recent Results (from the past 12 hour(s))   POCT Glucose    Collection Time: 11/01/21  7:43 AM   Result Value Ref Range    POC Glucose 176 mg/dL    Performed by: Wyvonnia Dusky    POCT Glucose    Collection Time: 11/01/21 11:54 AM   Result Value Ref Range    POC Glucose 278 mg/dL    Performed by: Wyvonnia Dusky    POCT Glucose    Collection Time: 11/01/21  4:40 PM   Result Value Ref Range    POC Glucose 234 mg/dL    Performed by: Konrad Felix            Current Facility-Administered Medications:     cefTRIAXone (ROCEPHIN) 1,000 mg in sterile water 10 mL IV syringe,  1,000 mg, IntraVENous, Q24H, Najjar, Majed Y, MD, 1,000 mg at 11/01/21 1413    0.9 % sodium chloride infusion, , IntraVENous, Continuous, Najjar, Lolita Rieger, MD, Last Rate: 125 mL/hr at 11/01/21 0545, New Bag at 11/01/21 0545    finasteride (PROSCAR) tablet 5 mg, 5 mg, Oral, Daily, Najjar, Lolita Rieger, MD, 5 mg at 11/01/21 1006    tamsulosin (FLOMAX) capsule 0.4 mg, 0.4 mg, Oral, Daily, Najjar, Majed Y, MD, 0.4 mg at 11/01/21 1006    sodium chloride flush 0.9 % injection 5-40 mL, 5-40 mL, IntraVENous, 2 times per day, Najjar, Lolita Rieger, MD, 10 mL at 11/01/21 1006    sodium chloride flush 0.9 % injection 5-40 mL, 5-40 mL, IntraVENous, PRN, Najjar, Majed Y, MD    0.9 % sodium chloride infusion, , IntraVENous, PRN, Najjar, Lolita Rieger, MD    ondansetron (ZOFRAN-ODT) disintegrating tablet 4 mg, 4 mg, Oral, Q8H PRN **OR** ondansetron (ZOFRAN) injection 4 mg, 4 mg, IntraVENous, Q6H PRN, Najjar, Lolita Rieger, MD    polyethylene glycol (GLYCOLAX) packet 17 g, 17 g, Oral, Daily PRN, Najjar, Lolita Rieger, MD, 17 g at 11/01/21 1415    acetaminophen (TYLENOL) tablet 650 mg, 650 mg, Oral, Q6H PRN **OR** acetaminophen (TYLENOL) suppository 650 mg, 650 mg, Rectal, Q6H PRN, Najjar, Majed Y, MD    heparin (porcine) injection 5,000 Units, 5,000 Units, SubCUTAneous, 3 times per day, Valma Cava,  MD, 5,000 Units at 11/01/21 1413    insulin lispro (1 Unit Dial) (HUMALOG/ADMELOG) pen 0-4 Units, 0-4 Units, SubCUTAneous, TID WC, Najjar, Lolita Rieger, MD, 1 Units at 11/01/21 1727    insulin lispro (1 Unit Dial) (HUMALOG/ADMELOG) pen 0-4 Units, 0-4 Units, SubCUTAneous, Nightly, Najjar, Majed Y, MD    glucose chewable tablet 16 g, 4 tablet, Oral, PRN, Najjar, Majed Y, MD    dextrose bolus 10% 125 mL, 125 mL, IntraVENous, PRN **OR** dextrose bolus 10% 250 mL, 250 mL, IntraVENous, PRN, Najjar, Majed Y, MD    glucagon injection 1 mg, 1 mg, SubCUTAneous, PRN, Najjar, Majed Y, MD    dextrose 10 % infusion, , IntraVENous, Continuous PRN, Najjar, Lolita Rieger, MD    Assessment/Plan:   This is a 66 year old male with past medical history of hypertension, BPH, diabetes mellitus (no longer taking medications, renal calculi presented with urinary symptoms of urinary frequency, urgency and cloudy urine.    AKI and bilateral hydronephrosis 2/2 obstructive uropathy in the setting of known BPH.   Continue IV   Foley catheter in place   Urology consulted   Continue Flomax and finasteride   Hold lisinopril  treat underlying UTI as below     UTI with non-obstructing renal/bladder calculi   UA positive with Gram-negative  Follow cultures for speciation and sensitivities   Continue with IV Rocephin     Hyperkalemia  Kayexalate   Monitor     Mild hyponatremia   IV fluids as above   Monitor     HTN  Hold lisinopril given AKI   Blood pressure currently marginal  monitor BP trend, if elevated resume Norvasc/Atenolol     Hx of DM-2  Not currently taking any medications   Hemoglobin A1c 10.6  ISS and diabetic diet  would not restart prior metformin given AKI     DVT ppx: SC Heparin     Full code    Disposition:  Anticipate eventual home    This document was generated with the aid of voice recognition software. Please be aware that there may be  inadvertent transcription errors not identified and corrected by the author.     Hospitalist: Wendie Agreste, MD

## 2021-11-01 NOTE — Plan of Care (Signed)
Problem: Chronic Conditions and Co-morbidities  Goal: Patient's chronic conditions and co-morbidity symptoms are monitored and maintained or improved  11/01/2021 1734 by Farrel Gordon, Laural Benes, RN  Outcome: Progressing  11/01/2021 1113 by Dierdre Searles, RN  Outcome: Progressing     Problem: Safety - Adult  Goal: Free from fall injury  Outcome: Progressing

## 2021-11-01 NOTE — ED Notes (Signed)
TRANSFER - OUT REPORT:    Verbal report given to [facility] RN on Dominic Stephens  being transferred to [facility] for routine progression of patient care       Report consisted of patient's Situation, Background, Assessment and   Recommendations(SBAR).     Information from the following report(s) ED Encounter Summary and ED SBAR was reviewed with the receiving nurse.    Kinder Fall Assessment:    Presents to emergency department  because of falls (Syncope, seizure, or loss of consciousness): No  Age > 70: No  Altered Mental Status, Intoxication with alcohol or substance confusion (Disorientation, impaired judgment, poor safety awaremess, or inability to follow instructions): No  Impaired Mobility: Ambulates or transfers with assistive devices or assistance; Unable to ambulate or transer.: No  Nursing Judgement: No          Lines:   Peripheral IV 10/31/21 Left Antecubital (Active)   Site Assessment Clean, dry & intact 10/31/21 1341   Line Status Blood return noted;Normal saline locked 10/31/21 1341   Phlebitis Assessment No symptoms 10/31/21 1341   Infiltration Assessment 0 10/31/21 1341        Opportunity for questions and clarification was provided.      Patient transported with:  Valentino Saxon, RN  11/01/21 [id number]

## 2021-11-01 NOTE — Progress Notes (Signed)
SpO2 WNL on room air for over 24 hours; D/C RT follow-up.

## 2021-11-01 NOTE — Plan of Care (Signed)
Problem: Chronic Conditions and Co-morbidities  Goal: Patient's chronic conditions and co-morbidity symptoms are monitored and maintained or improved  Outcome: Progressing

## 2021-11-01 NOTE — Care Coordination-Inpatient (Addendum)
Case Management Initial Assessment:    Met with Dominic Stephens who is being treated for Hyperkalemia [E87.5]  Urinary retention [R33.9]  AKI (acute kidney injury) (Dominic Stephens) [N17.9]  Supraventricular bigeminy [R00.8]  Urinary tract infection without hematuria, site unspecified [N39.0].  Introduced the role of the Tourist information centre manager. Provided contact information for case manager.      Dominic Stephens is currently Inpatient.     Encouraged Dominic Stephens to contact RN/SW Case Manager with any questions or concerns related to the discharge plan.       IMM completed with date and time confirmed Yes   MOON complete: No    Potential Barriers to successful discharge :  None    DISCHARGE DISPOSITION:  Anticipate home with no services.     Insurance authorization needed for STR: YES / NO: No     Pt has foley catheter and receiving IV abx. Reports increasing weakness d/t infection but denies any needs and ambulates independently .Lives home with wife and child. Pt hasn't had PCP in a while as his old one retired, he is now followed by Dr. Channing Mutters and pt is aware of this.        11/01/21 1508   Service Assessment   Patient Orientation Alert and Oriented   Cognition Alert   History Provided By Patient   Primary Caregiver Self   Accompanied By/Relationship Self   Support Systems Spouse/Significant Other;Children   Patient's Healthcare Decision Maker is: Legal Next of [place]   PCP Verified by CM Yes  (Pt initially said he has no PCP since his old one retired; confirmed Dr. Essie Christine has taken over)   Last Visit to PCP Within last two years   Prior Functional Level Independent in ADLs/IADLs   Can patient return to prior living arrangement Yes   Ability to make needs known: Good   Family able to assist with home care needs: Yes   Would you like for me to discuss the discharge plan with any other family members/significant others, and if so, who? No   Financial Resources [employer] Resources None   Social/Functional History   Lives With Spouse;Son   Type of  Home House   Home Layout Multi-level   Bathroom Shower/Tub Tub/Shower unit   Biochemist, clinical Standard   ADL Assistance Independent   Homemaking Assistance Independent   Ambulation Assistance Independent   Transfer Assistance Independent   Active Driver Yes   Discharge Planning   Type of [place] Spouse/Significant Other;Children   Current Services Prior To Admission None   Potential Assistance Purchasing Medications No   Type of Home Care Services None   Patient expects to be discharged to: House   One/Two Story Residence Two story  (3 levels with basement, 2 sets of 12 stairs)   Leisure centre manager Chair Available No   History of falls? 0   Services At/After Discharge   Services At/After Discharge None   [employer] Information Provided? No   Mode of Transport at Discharge [place] Time of Discharge   (TBD)   Confirm Follow Up Transport Self   Condition of Participation: Discharge Planning   The Plan for Transition of Care is related to the following treatment goals: Pt is experiencing fatigue due to recent infection and has a foley catherter in place awaiting follow up from urology.

## 2021-11-01 NOTE — Consults (Addendum)
[facility], [place]  CLINICAL NUTRITION    NUTRITION ASSESSMENT   Type of Assessment:  [x]  Initial    []  Follow Up    Referral From:   []  MD    [x]   RN    []  Patient Request    []  Nutrition Screen     Reason for Referral:   [x]  MST    []  Pressure Injury/Skin Breakdown    []  Poor Appetite/PO   []  Diet Ed   []  Other:     Current Diet Order: ADULT DIET; Regular; 4 carb choices (60 gm/meal); Low Potassium (Less than 3000 mg/day)  ADULT ORAL NUTRITION SUPPLEMENT; Dinner; Diabetic Oral Supplement   Food Allergies: no known food allergies -abx    Anthropometrics:  Height: 6\' 5"  (195.6 cm)      Admit Weight - Scale: 281 lb 8 oz (127.7 kg)   Most Recent Weight - Scale: 281 lb 8 oz (127.7 kg)   Body mass index is 33.38 kg/m.     Patient Vitals for the past 336 hrs:   Weight   10/31/21 1226 281 lb 8 oz (127.7 kg)          IBW RANGE: 70.8-95.6kg  % IBW: 108%  UBW: 140kg  % UBW: 86%    Adjusted wt: 103.6kg  Weight History: Pt reports his UBW at 140kg approx. 3 mo ago. This is consistent with his wt from 2021. Today pt weight recorded by scale was 123kg. Pt has lost 12.3% UBW in ~3 months.   Wt Readings from Last 30 Encounters:   10/31/21 281 lb 8 oz (127.7 kg)   04/26/19 (!) 308 lb (139.7 kg)       Daily Estimated Needs: based on []  Current wt   []   IBW   []  UBW   [x]  Adjusted wt  Calories (kcals):  2,600-3,100 25-30 kcal/kg/day   Protein (g):  83-103 0.8-1.0g/kg/day   Fluid(ml):  2,600-3,100 44mL/kcal     Pertinent Medications: Reviewed [x]  abx    Labs:  Recent Labs     10/31/21  1338 10/31/21  2028 11/01/21  0318   NA 132* 134* 133*   K 5.7* 4.9 5.5*   CL 101 104 105   CO2 19* 18* 16*   BUN 61* 58* 52*   CREATININE 2.57* 2.37* 2.02*   CALCIUM 9.3 9.3 8.8   GLUCOSE 153* 164* 259*       Lab Results   Component Value Date/Time    LABA1C 10.6 (H) 10/31/2021 01:36 PM       Recent Labs     10/31/21  1605 10/31/21  1703 11/01/21  0743 11/01/21  1154   POCGLU 111 76 176 278        PMH: Reviewed [x]   No past medical history  on file. Pt has T2DM.     Meal & Supplement Intake:  Patient Vitals for the past 200 hrs:   PO Meals Eaten (%)   11/01/21 1305 76 - 100%   11/01/21 0915 76 - 100%   Pt reports decreased PO intake for ~1 month. He states eating 1 meal per day during this time. Last night pt says he ate one third of his meal.     Pt has consumed < or = 75% estimated energy needs for >3 months  Feeding Route:  [x]  Oral  []  Enteral  []  Parenteral    Gastrointestinal: (per nursing flowsheets)  Last Bowel Movement: 11/01/21  GI Symptoms:  Chewing/Swallowing Issues: n/a    Skin Integrity:   [x]  WDL      []  Pressure Injury:     []  Other:       Nutrition Focused Physical Assessment:   Pt declined physical assessment. Says his face looks thinner than usual, clothes fit loosely, with mild muscle wasting of the trapezius and interosseous muscle. Skin is pale. Pt reports clothes feeling looser over the past month.     Edema: (per nursing flowsheets)   Peripheral Vascular (WDL): Within Defined Limits    NUTRITION DIAGNOSIS:    Malnutrition   MALNUTRITION ASSESSMENT AND PLAN    The following was documented by the Dietitian:    Malnutrition Assessment  Context of Malnutrition: Chronic Illness (11/01/21 1242)  Chronic Illness - Energy Intake : 75% or less estimated energy requirements for 1 month or longer (11/01/21 1242)  Chronic Illness - Weight Loss : Greater than 7.5% over 3 months (11/01/21 1242)  Chronic Illness - Body Fat Loss: Mild body fat loss (11/01/21 1242)  Chronic Illness - Body Fat Loss Locations: Orbital (11/01/21 1242)  Chronic Illness - Muscle Mass Loss: Mild muscle mass loss (11/01/21 1242)  Chronic Illness - Muscle Mass Loss Location: Scapula (trapezius);Hand (interosseous) (11/01/21 1242)  Chronic Illness - Fluid Accumulation : No significant fluid accumulation (11/01/21 1242)  Chronic Illness - Malnutrition Score: 16 (11/01/21 1242)  Malnutrition Status: Severe malnutrition (11/01/21 1242)      PES:    Severe chronic  malnutrition related to poor PO intake as evidenced by >7.5% wt loss in ~3 mo and diet recall reveals < or = 75% of estimated energy needs for ~3 mo.     INTERVENTION & PLAN   Food and/or Nutrient Delivery  Continue Diet Regular, CCD (60gm/meal), Low Potassium  Initiate Glucerna Chocolate with dinner      Nutrition Education  Offered consistent CHO diet education (pt declined)       Coordination of Nutrition Care  Offered pt information about outpatient diabetes services (pt declined)   Pt wife was present during visit  RD sent inbasket message to attending physician and medical coders regarding malnutrition      Assessment Summary:   66 yo male presents with AKI now c/o unintentional wt loss for ~3 months. PMHx is notable for kidney stones and T2DM. During interview pt stated he had lost 40 lbs in the past 2-3 mo, reports a usual body wt of 139.7kg. Pt has lost 12.3% of his UBW in ~3 mo. Per diet recall, pt appetite has been poor during this time, stating he only ate 1 meal per day with a beverage. Before onset of weight loss, pt reports he would eat large amounts of sugar (chocolate milkshape with whipped cream). He also stated his face looked thinner recently. Pt refused a nutrition focused physical exam, but had visible mild wasting of the trapezius and interosseous muscle and mild orbital fat wasting. Pt A1C is high; discussed consistent CHO intake with him and offered educational handouts, which pt declined. Pt seems to understand his condition, but would benefit from more diabetes counseling. Pt was willing to initiate on chocolate glucerna, will send daily with meals.       Goal(s):  Consume 100% of Glucerna at dinnertime  Consume >75% of meals    MONITORING & EVALUATION   Follow Up Date: 11/04/21    Priority Level of Care: [x]  High []  Moderate []  Low

## 2021-11-01 NOTE — Consults (Signed)
Urology Consult    Reason for consult:   Urinary retention, hydronephrosis    Subjective:   This is a 66y/o former pt of Drs. [facility] and [employer], last seen in 2020, w/ known BPH w/ LUTS, bladder stones, b/l hydroureteronephrosis, and recurrent UTIs, previously declined stone treatment.  He presented to the ER yesterday with urinary frequency, urgency, and cloudy urine as well as worsening fatigue and 40-lb weight loss over the past 2-3 months.    Evaluation in the ER notable for WBC 13.7, H/H 11/34, plt 346, lactate 0.8, Cr 2.57 (1.21 in 2020), K 5.7, a1c 10.6, prelim UCx >100K GNR, and CT A/P w/o contrast showing a markedly distended bladder w/ a ~1 cm stone in it, mod-severe b/l hydroureteronephrosis, a couple of small LLP stones, and some prostate calcifications. He had a foley catheter placed for 2.5L and was started on Rocephin.  His WBC is down to 11.9 today and Cr is down to 2.02.    I was able to review his CTU from 2019. He also had b/l hydroureteronephrosis then, moderate R and mild L.    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   Past Surgical History:   Procedure Laterality Date    ORTHOPEDIC SURGERY      Lumbar discecomy 2001      Medications Prior to Admission: amLODIPine (NORVASC) 10 MG tablet, TAKE 1 TABLET BY MOUTH ONCE  DAILY  atenolol (TENORMIN) 50 MG tablet, TAKE 1 TABLET BY MOUTH TWICE DAILY  lisinopril (PRINIVIL;ZESTRIL) 5 MG tablet, Take 1 tablet by mouth daily  Allergies   Allergen Reactions    Methocarbamol Other (See Comments)    Sulfa Antibiotics Myalgia    Penicillins Rash     Patient states he can take other medications in this family      Social History     Tobacco Use    Smoking status: Never    Smokeless tobacco: Never   Substance Use Topics    Alcohol use: Not on file      Family History   Problem Relation Age of Onset    Hypertension Mother     Diabetes Father     Diabetes Mother       Review of Systems  Pertinent items are noted in HPI.    Objective:     Patient Vitals for the past 8 hrs:   BP Temp Temp src Pulse Resp SpO2   11/01/21 1715 (!) 106/52 98.3 F (36.8 C) Temporal 80 17 96 %   11/01/21 1601 -- -- -- -- -- 95 %   11/01/21 1200 (!) 117/57 98 F (36.7 C) Temporal 75 18 95 %   11/01/21 0954 109/78 96.8 F (36 C) Temporal 80 18 96 %   Exam:  NAD  Resp non-labored  Abd soft NT ND  No CVAT  Foley draining CYU, scant sediment in bag  DRE deferred    Data Review:  I personally reviewed the labs and imaging as per HPI    Assessment:   Bilateral hydroureteronephrosis, bladder calculus, ARF, and UTI 2/2 BPH w/ BOO     Plan:   - Keep foley indefinitely.  - Monitor for post-obstructive diuresis.  - Agree w/ Flomax and finasteride.  - I will request outpatient follow up in ~2 weeks to discuss cystoscopy and urodynamics to assess candidacy for an outlet procedure versus catheterization options (he seems more open to intervention now than he has in the past from reading prior notes)     Thank you for this consult. Please do not hesitate to contact me with any questions or concerns.

## 2021-11-02 LAB — CBC
Hematocrit: 30.6 % — ABNORMAL LOW (ref 40.1–51.0)
Hemoglobin: 9.5 g/dL — ABNORMAL LOW (ref 13.7–17.5)
MCH: 28.4 PG (ref 25.6–32.2)
MCHC: 31 g/dL — ABNORMAL LOW (ref 32.2–35.5)
MCV: 91.6 FL (ref 80.0–95.0)
MPV: 9 FL — ABNORMAL LOW (ref 9.4–12.4)
Nucleated RBCs: 0 PER 100 WBC (ref 0–0.02)
Platelets: 293 10*3/uL (ref 150–400)
RBC: 3.34 M/uL — ABNORMAL LOW (ref 4.51–5.93)
RDW: 13.2 % (ref 11.6–14.4)
WBC: 9.7 10*3/uL (ref 4.0–10.0)
nRBC: 0 10*3/uL

## 2021-11-02 LAB — BASIC METABOLIC PANEL
Anion Gap: 11 mmol/L (ref 5–15)
BUN: 36 MG/DL — ABNORMAL HIGH (ref 8–23)
Bun/Cre Ratio: 23 — ABNORMAL HIGH (ref 12–20)
CO2: 18 mmol/L — ABNORMAL LOW (ref 22–29)
Calcium: 8.8 MG/DL (ref 8.8–10.2)
Chloride: 108 mmol/L — ABNORMAL HIGH (ref 98–107)
Creatinine: 1.58 MG/DL — ABNORMAL HIGH (ref 0.67–1.17)
Est, Glom Filt Rate: 49 mL/min/{1.73_m2} — ABNORMAL LOW (ref >60)
Glucose: 157 mg/dL — ABNORMAL HIGH (ref 74–109)
Potassium: 4.7 mmol/L (ref 3.5–5.1)
Sodium: 137 mmol/L (ref 136–145)

## 2021-11-02 LAB — POCT GLUCOSE
POC Glucose: 235 mg/dL
POC Glucose: 174 mg/dL
POC Glucose: 225 mg/dL
POC Glucose: 216 mg/dL

## 2021-11-02 MED ORDER — DOCUSATE SODIUM 100 MG PO CAPS
100 | Freq: Two times a day (BID) | ORAL | Status: DC
Start: 2021-11-02 — End: 2021-11-04
  Administered 2021-11-02 – 2021-11-04 (×5): 100 mg via ORAL

## 2021-11-02 MED ORDER — ATENOLOL 50 MG PO TABS
50 | Freq: Every day | ORAL | Status: DC
Start: 2021-11-02 — End: 2021-11-04
  Administered 2021-11-03 – 2021-11-04 (×2): 50 mg via ORAL

## 2021-11-02 MED ORDER — AMLODIPINE BESYLATE 10 MG PO TABS
10 | Freq: Every day | ORAL | Status: DC
Start: 2021-11-02 — End: 2021-11-04
  Administered 2021-11-03 – 2021-11-04 (×3): 10 mg via ORAL

## 2021-11-02 MED FILL — DOCUSATE SODIUM 100 MG PO CAPS: 100 MG | ORAL | Qty: 1

## 2021-11-02 MED FILL — FINASTERIDE 5 MG PO TABS: 5 MG | ORAL | Qty: 1

## 2021-11-02 MED FILL — PEG 3350 17 G PO PACK: 17 g | ORAL | Qty: 1

## 2021-11-02 MED FILL — CEFTRIAXONE SODIUM 1 G IJ SOLR: 1 g | INTRAMUSCULAR | Qty: 1000

## 2021-11-02 MED FILL — TAMSULOSIN HCL 0.4 MG PO CAPS: 0.4 MG | ORAL | Qty: 1

## 2021-11-02 MED FILL — HEPARIN SODIUM (PORCINE) 5000 UNIT/ML IJ SOLN: 5000 UNIT/ML | INTRAMUSCULAR | Qty: 1

## 2021-11-02 NOTE — Care Coordination-Inpatient (Signed)
Discussed patient in ID rounds. Being treated for a UTI. Patient may d/c over the weekend. No needs anticipated.Foley will remain in place at d/c and will f/u with urology as outpatient.

## 2021-11-02 NOTE — Progress Notes (Signed)
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 Notes that he was previously on metformin and glimepiride for his diabetic management however once his primary care provider retired he never followed  Notes pain around Foley catheter insertion site     All other systems reviewed and negative      Past Medical History and Social history reviewed. No changes.   [x] Current Medication list and allergies reviewed*    Objective:   Vitals:  BP (!) 114/58   Pulse 68   Temp 97.2 F (36.2 C) (Temporal)   Resp 18   Ht 6\' 5"  (1.956 m)   Wt 281 lb 8 oz (127.7 kg)   SpO2 96%   BMI 33.38 kg/m   BP: (!) 114/58   Temp (24hrs), Avg:97.6 F (36.4 C), Min:96.8 F (36 C), Max:98.3 F (36.8 C)    Weight change:             Last 24hr Input/Output:    Intake/Output Summary (Last 24 hours) at 11/02/2021 0827  Last data filed at 11/02/2021 0417  Gross per 24 hour   Intake 1150 ml   Output 3525 ml   Net -2375 ml                No results found for: "GLU", "GLUCPOC"    No results found for: "INR", "PTMR", "PT1"        PHYSICAL EXAM:   General:     Alert, coherent, cooperative, no distress.     HEENT:           Normocephalic, atraumatic   Lungs:             Clear to auscultation  Heart:   Normal cardiac rate and rhythm, normal heart sounds.  Abdomen:   Soft, non-tender, normoactive bowel sounds  Genitourinary: Foley catheter in place, clear urine   Extremities:  Spontaneous movement of extremities  Skin:     Warm and dry   Neurologic: AAOx3     Psych:  Mood and affect congruent       Lab Data Reviewed:  No components found for: "[facility]"  Recent Labs     10/31/21  1338 10/31/21  2028 11/01/21  0318 11/02/21  0319   NA 132* 134* 133* 137   K 5.7* 4.9 5.5* 4.7   CL 101 104 105 108*   CO2 19* 18* 16* 18*   BUN 61* 58* 52* 36*   WBC 13.7*  --   11.9* 9.7   HGB 10.9*  --  9.9* 9.5*   HCT 34.4*  --  31.4* 30.6*   PLT 346  --  285 293         Recent Results (from the past 24 hour(s))   POCT Glucose    Collection Time: 11/01/21 11:54 AM   Result Value Ref Range    POC Glucose 278 mg/dL    Performed by: 01/02/22    POCT Glucose    Collection Time: 11/01/21  4:40 PM   Result Value Ref Range    POC Glucose 234 mg/dL    Performed by: Wyvonnia Dusky    POCT Glucose    Collection Time: 11/01/21  8:58 PM  Result Value Ref Range    POC Glucose 235 mg/dL    Performed by: Konrad Felix    CBC    Collection Time: 11/02/21  3:19 AM   Result Value Ref Range    WBC 9.7 4.0 - 10.0 K/uL    RBC 3.34 (L) 4.51 - 5.93 M/uL    Hemoglobin 9.5 (L) 13.7 - 17.5 g/dL    Hematocrit 63.8 (L) 40.1 - 51.0 %    MCV 91.6 80.0 - 95.0 FL    MCH 28.4 25.6 - 32.2 PG    MCHC 31.0 (L) 32.2 - 35.5 g/dL    RDW 75.6 43.3 - 29.5 %    Platelets 293 150 - 400 K/uL    MPV 9.0 (L) 9.4 - 12.4 FL    Nucleated RBCs 0.0 0 - 0.02 PER 100 WBC    nRBC 0.00 K/uL   Basic Metabolic Panel    Collection Time: 11/02/21  3:19 AM   Result Value Ref Range    Sodium 137 136 - 145 mmol/L    Potassium 4.7 3.5 - 5.1 mmol/L    Chloride 108 (H) 98 - 107 mmol/L    CO2 18 (L) 22 - 29 mmol/L    Anion Gap 11 5 - 15 mmol/L    Glucose 157 (H) 74 - 109 mg/dL    BUN 36 (H) 8 - 23 MG/DL    Creatinine 1.88 (H) 0.67 - 1.17 MG/DL    Bun/Cre Ratio 23 (H) 12 - 20    Est, Glom Filt Rate 49 (L) >60 ml/min/1.36m2    Calcium 8.8 8.8 - 10.2 MG/DL   POCT Glucose    Collection Time: 11/02/21  7:19 AM   Result Value Ref Range    POC Glucose 174 mg/dL    Performed by: Wyvonnia Dusky        Recent Results (from the past 12 hour(s))   POCT Glucose    Collection Time: 11/01/21  8:58 PM   Result Value Ref Range    POC Glucose 235 mg/dL    Performed by: Konrad Felix    CBC    Collection Time: 11/02/21  3:19 AM   Result Value Ref Range    WBC 9.7 4.0 - 10.0 K/uL    RBC 3.34 (L) 4.51 - 5.93 M/uL    Hemoglobin 9.5 (L) 13.7 - 17.5 g/dL    Hematocrit 41.6 (L) 40.1  - 51.0 %    MCV 91.6 80.0 - 95.0 FL    MCH 28.4 25.6 - 32.2 PG    MCHC 31.0 (L) 32.2 - 35.5 g/dL    RDW 60.6 30.1 - 60.1 %    Platelets 293 150 - 400 K/uL    MPV 9.0 (L) 9.4 - 12.4 FL    Nucleated RBCs 0.0 0 - 0.02 PER 100 WBC    nRBC 0.00 K/uL   Basic Metabolic Panel    Collection Time: 11/02/21  3:19 AM   Result Value Ref Range    Sodium 137 136 - 145 mmol/L    Potassium 4.7 3.5 - 5.1 mmol/L    Chloride 108 (H) 98 - 107 mmol/L    CO2 18 (L) 22 - 29 mmol/L    Anion Gap 11 5 - 15 mmol/L    Glucose 157 (H) 74 - 109 mg/dL    BUN 36 (H) 8 - 23 MG/DL    Creatinine 0.93 (H) 0.67 - 1.17 MG/DL    Bun/Cre Ratio 23 (H) 12 - 20  Est, Glom Filt Rate 49 (L) >60 ml/min/1.37m2    Calcium 8.8 8.8 - 10.2 MG/DL   POCT Glucose    Collection Time: 11/02/21  7:19 AM   Result Value Ref Range    POC Glucose 174 mg/dL    Performed by: Joline Salt            Current Facility-Administered Medications:     cefTRIAXone (ROCEPHIN) 1,000 mg in sterile water 10 mL IV syringe, 1,000 mg, IntraVENous, Q24H, Najjar, Majed Y, MD, 1,000 mg at 11/01/21 1413    finasteride (PROSCAR) tablet 5 mg, 5 mg, Oral, Daily, Najjar, Lolita Rieger, MD, 5 mg at 11/01/21 1006    tamsulosin (FLOMAX) capsule 0.4 mg, 0.4 mg, Oral, Daily, Najjar, Majed Y, MD, 0.4 mg at 11/01/21 1006    sodium chloride flush 0.9 % injection 5-40 mL, 5-40 mL, IntraVENous, 2 times per day, Valma Cava, MD, 10 mL at 11/01/21 2131    sodium chloride flush 0.9 % injection 5-40 mL, 5-40 mL, IntraVENous, PRN, Najjar, Majed Y, MD    0.9 % sodium chloride infusion, , IntraVENous, PRN, Najjar, Lolita Rieger, MD    ondansetron (ZOFRAN-ODT) disintegrating tablet 4 mg, 4 mg, Oral, Q8H PRN **OR** ondansetron (ZOFRAN) injection 4 mg, 4 mg, IntraVENous, Q6H PRN, Najjar, Lolita Rieger, MD    polyethylene glycol (GLYCOLAX) packet 17 g, 17 g, Oral, Daily PRN, Najjar, Lolita Rieger, MD, 17 g at 11/01/21 1415    acetaminophen (TYLENOL) tablet 650 mg, 650 mg, Oral, Q6H PRN **OR** acetaminophen (TYLENOL) suppository 650 mg, 650  mg, Rectal, Q6H PRN, Najjar, Majed Y, MD    heparin (porcine) injection 5,000 Units, 5,000 Units, SubCUTAneous, 3 times per day, Valma Cava, MD, 5,000 Units at 11/01/21 1413    insulin lispro (1 Unit Dial) (HUMALOG/ADMELOG) pen 0-4 Units, 0-4 Units, SubCUTAneous, TID WC, Najjar, Lolita Rieger, MD, 1 Units at 11/01/21 1727    insulin lispro (1 Unit Dial) (HUMALOG/ADMELOG) pen 0-4 Units, 0-4 Units, SubCUTAneous, Nightly, Najjar, Majed Y, MD    glucose chewable tablet 16 g, 4 tablet, Oral, PRN, Najjar, Majed Y, MD    dextrose bolus 10% 125 mL, 125 mL, IntraVENous, PRN **OR** dextrose bolus 10% 250 mL, 250 mL, IntraVENous, PRN, Najjar, Majed Y, MD    glucagon injection 1 mg, 1 mg, SubCUTAneous, PRN, Najjar, Majed Y, MD    dextrose 10 % infusion, , IntraVENous, Continuous PRN, Najjar, Lolita Rieger, MD    Assessment/Plan:   This is a 66 year old male with past medical history of hypertension, BPH, diabetes mellitus (no longer taking medications, renal calculi presented with urinary symptoms of urinary frequency, urgency and cloudy urine.    AKI and bilateral hydronephrosis 2/2 obstructive uropathy in the setting of known BPH.  Creatinine improving  Urology consulted, appreciate recs   Foley catheter to remain in place indefinitely until outpatient with Urology for cystoscopy and urodynamics  Continue Flomax and finasteride   Hold lisinopril  treat underlying UTI as below     Gram-negative rods UTI with non-obstructing renal/bladder calculi  Follow cultures for speciation and sensitivities   Continue with IV Rocephin     Hyperkalemia  Resolved status post treatment     Mild hyponatremia  Improved with IV fluids     HTN  Hold lisinopril given AKI   Blood pressure trending up, will resume baseline Norvasc 10 mg daily and atenolol 50 mg daily     Hx of DM-2  Previously on metformin and glimepiride however discontinued medication  after his primary care provider retired.  Will need to resume  Hemoglobin A1c 10.6  ISS and diabetic  diet    Severe chronic malnutrition   Reports unintentional weight loss of about 40 lb over the last 3 months due to poor oral intake  Nutrition/dietitian consult     DVT ppx: SC Heparin     Full code    Disposition:  Anticipate eventual home    This document was generated with the aid of voice recognition software. Please be aware that there may be inadvertent transcription errors not identified and corrected by the author.     Hospitalist: Wendie Agreste, MD

## 2021-11-02 NOTE — Progress Notes (Signed)
Brief visit with patient.

## 2021-11-03 LAB — POCT GLUCOSE
POC Glucose: 217 mg/dL
POC Glucose: 221 mg/dL
POC Glucose: 344 mg/dL
POC Glucose: 223 mg/dL
POC Glucose: 221 mg/dL

## 2021-11-03 LAB — BASIC METABOLIC PANEL
Anion Gap: 12 mmol/L (ref 5–15)
BUN: 29 MG/DL — ABNORMAL HIGH (ref 8–23)
Bun/Cre Ratio: 20 (ref 12–20)
CO2: 21 mmol/L — ABNORMAL LOW (ref 22–29)
Calcium: 9.2 MG/DL (ref 8.8–10.2)
Chloride: 105 mmol/L (ref 98–107)
Creatinine: 1.45 MG/DL — ABNORMAL HIGH (ref 0.67–1.17)
Est, Glom Filt Rate: 54 mL/min/{1.73_m2} — ABNORMAL LOW (ref >60)
Glucose: 213 mg/dL — ABNORMAL HIGH (ref 74–109)
Potassium: 4.6 mmol/L (ref 3.5–5.1)
Sodium: 138 mmol/L (ref 136–145)

## 2021-11-03 LAB — CBC
Hematocrit: 30 % — ABNORMAL LOW (ref 40.1–51.0)
Hemoglobin: 9.7 g/dL — ABNORMAL LOW (ref 13.7–17.5)
MCH: 29.7 PG (ref 25.6–32.2)
MCHC: 32.3 g/dL (ref 32.2–35.5)
MCV: 91.7 FL (ref 80.0–95.0)
MPV: 8.7 FL — ABNORMAL LOW (ref 9.4–12.4)
Nucleated RBCs: 0 PER 100 WBC (ref 0–0.02)
Platelets: 293 10*3/uL (ref 150–400)
RBC: 3.27 M/uL — ABNORMAL LOW (ref 4.51–5.93)
RDW: 13.2 % (ref 11.6–14.4)
WBC: 9.3 10*3/uL (ref 4.0–10.0)
nRBC: 0 10*3/uL

## 2021-11-03 LAB — EKG 12-LEAD
Atrial Rate: 35 ms
EKG I-40 FRONT AXIS: 34 deg
EKG I-40 HORIZONTAL AXIS: 69 deg
EKG P DURATION: 119 ms
EKG P FRONT AXIS: 10 deg
EKG P HORIZONTAL AXIS: 34 deg
EKG Q ONSET: 502 ms
EKG QRS AXIS: 42 deg
EKG QRS HORIZONTAL AXIS: 9 deg
EKG QRSD INTERVAL: 82 ms
EKG QTCB: 393 ms
EKG QTCF: 382 ms
EKG RR INTERVAL: 845 ms
EKG S-T FRONT AXIS: 36 deg
EKG S-T HORIZONTAL AXIS: 74 deg
EKG T HORIZONTAL AXIS: 66 deg
EKG T WAVE AXIS: 36 deg
EKG T-40 FRONT AXIS: 44 deg
EKG T-40 HORIZONTAL AXIS: -3 deg
Heart Rate: 71 {beats}/min
P-R Interval: 186 ms
Q-T Interval: 361 ms

## 2021-11-03 LAB — CULTURE, URINE: Culture: >100000 — AB

## 2021-11-03 MED ORDER — BISACODYL 10 MG RE SUPP
10 MG | Freq: Every day | RECTAL | Status: DC | PRN
Start: 2021-11-03 — End: 2021-11-04
  Administered 2021-11-03: 16:00:00 10 mg via RECTAL

## 2021-11-03 MED ORDER — CIPROFLOXACIN HCL 250 MG PO TABS
250 MG | Freq: Two times a day (BID) | ORAL | Status: DC
Start: 2021-11-03 — End: 2021-11-04
  Administered 2021-11-03 – 2021-11-04 (×2): 500 mg via ORAL

## 2021-11-03 MED FILL — FINASTERIDE 5 MG PO TABS: 5 MG | ORAL | Qty: 1

## 2021-11-03 MED FILL — CIPROFLOXACIN HCL 500 MG PO TABS: 500 MG | ORAL | Qty: 1

## 2021-11-03 MED FILL — HEPARIN SODIUM (PORCINE) 5000 UNIT/ML IJ SOLN: 5000 UNIT/ML | INTRAMUSCULAR | Qty: 1

## 2021-11-03 MED FILL — TAMSULOSIN HCL 0.4 MG PO CAPS: 0.4 MG | ORAL | Qty: 1

## 2021-11-03 MED FILL — DOCUSATE SODIUM 100 MG PO CAPS: 100 MG | ORAL | Qty: 1

## 2021-11-03 MED FILL — AMLODIPINE BESYLATE 10 MG PO TABS: 10 MG | ORAL | Qty: 1

## 2021-11-03 MED FILL — ATENOLOL 25 MG PO TABS: 25 MG | ORAL | Qty: 2

## 2021-11-03 MED FILL — BISACODYL 10 MG RE SUPP: 10 MG | RECTAL | Qty: 1

## 2021-11-03 MED FILL — PEG 3350 17 G PO PACK: 17 g | ORAL | Qty: 1

## 2021-11-03 NOTE — Progress Notes (Signed)
General Daily Progress Note    Admit Date: 10/31/2021      Subjective:   Evaluated the patient in the morning with RN Cortland Campbell and RN Terrence Dupont the bedside.  Patient was awake, alert, oriented 3.  Afebrile.  Not hypoxic.  Was found with Foley's catheter.  Denied abdominal pain.  No nausea, vomiting, no diarrhea.  Denied chest.  No shortness of breathing    Current Facility-Administered Medications   Medication Dose Route Frequency    bisacodyl (DULCOLAX) suppository 10 mg  10 mg Rectal Daily PRN    docusate sodium (COLACE) capsule 100 mg  100 mg Oral BID    amLODIPine (NORVASC) tablet 10 mg  10 mg Oral Daily    atenolol (TENORMIN) tablet 50 mg  50 mg Oral Daily    cefTRIAXone (ROCEPHIN) 1,000 mg in sterile water 10 mL IV syringe  1,000 mg IntraVENous Q24H    finasteride (PROSCAR) tablet 5 mg  5 mg Oral Daily    tamsulosin (FLOMAX) capsule 0.4 mg  0.4 mg Oral Daily    sodium chloride flush 0.9 % injection 5-40 mL  5-40 mL IntraVENous 2 times per day    sodium chloride flush 0.9 % injection 5-40 mL  5-40 mL IntraVENous PRN    0.9 % sodium chloride infusion   IntraVENous PRN    ondansetron (ZOFRAN-ODT) disintegrating tablet 4 mg  4 mg Oral Q8H PRN    Or    ondansetron (ZOFRAN) injection 4 mg  4 mg IntraVENous Q6H PRN    polyethylene glycol (GLYCOLAX) packet 17 g  17 g Oral Daily PRN    acetaminophen (TYLENOL) tablet 650 mg  650 mg Oral Q6H PRN    Or    acetaminophen (TYLENOL) suppository 650 mg  650 mg Rectal Q6H PRN    heparin (porcine) injection 5,000 Units  5,000 Units SubCUTAneous 3 times per day    insulin lispro (1 Unit Dial) (HUMALOG/ADMELOG) pen 0-4 Units  0-4 Units SubCUTAneous TID WC    insulin lispro (1 Unit Dial) (HUMALOG/ADMELOG) pen 0-4 Units  0-4 Units SubCUTAneous Nightly    glucose chewable tablet 16 g  4 tablet Oral PRN    dextrose bolus 10% 125 mL  125 mL IntraVENous PRN    Or    dextrose bolus 10% 250 mL  250 mL IntraVENous PRN    glucagon injection 1 mg  1 mg SubCUTAneous PRN    dextrose 10 % infusion    IntraVENous Continuous PRN          Objective:     Patient Vitals for the past 8 hrs:   BP Temp Temp src Pulse Resp SpO2   11/03/21 1211 (!) 148/68 98.6 F (37 C) Oral 71 16 97 %   11/03/21 0907 126/64 97.7 F (36.5 C) Oral 87 16 97 %     10/07 0701 - 10/07 1900  In: 480 [P.O.:480]  Out: 1150 [Urine:1150]  10/05 1901 - 10/07 0700  In: 480 [P.O.:480]  Out: 0347 [Urine:6575]    Physical Exam:   General:  Alert, cooperative, no distress, appears stated age.  Oriented x3.   Head:  Normocephalic, without obvious abnormality, atraumatic.   Eyes:  Conjunctivae/corneas clear.  EOMs intact.    Ears:  Normal external ear canals both ears.   Nose: Nares normal. No drainage   Throat: Lips, mucosa, and tongue normal.   Neck: Supple, symmetrical, trachea midline, no adenopathy, thyroid: no enlargement/tenderness/nodules, no carotid bruit and no JVD.   Back:  No CVA tenderness.   Lungs:   Clear to auscultation bilaterally.  No wheezing, no rales.   Chest wall:  No  deformity.   Heart:  Regular rate and rhythm, S1, S2 normal, no murmur, click, rub or gallop.   Breast Exam:  No tenderness, masses, or nipple abnormality as per patient.  Not examined..   Abdomen:   Soft, non-tender. Bowel sounds normal. No masses,  No organomegaly.  Obese.   Genitalia:  Normal without lesion, discharge or tenderness.  With Foley's catheter..   Rectal:  Nothing abnormal as per the patient not examined..   Extremities: Extremities normal, atraumatic, no cyanosis or edema.   Pulses: 2+ and symmetric all extremities.   Skin: Skin color, texture, turgor normal. No rashes or lesions.   Lymph nodes: Cervical, supraclavicular, and axillary nodes normal.   Neurologic: CNII-XII and peripheral nervous system are intact.    Psychiatric evaluation:  Cooperative, pleasant, not depressed.             Data Review     Last 24hr Input/Output:    Intake/Output Summary (Last 24 hours) at 11/03/2021 1456  Last data filed at 11/03/2021 1304  Gross per 24 hour   Intake  960 ml   Output 3725 ml   Net -2765 ml          @ECHO48 @    Recent Results (from the past 24 hour(s))   POCT Glucose    Collection Time: 11/02/21  4:34 PM   Result Value Ref Range    POC Glucose 216 mg/dL    Performed by: 01/02/22 Maribel    POCT Glucose    Collection Time: 11/02/21  8:41 PM   Result Value Ref Range    POC Glucose 217 mg/dL    Performed by: 01/02/22    Basic Metabolic Panel    Collection Time: 11/03/21  4:59 AM   Result Value Ref Range    Sodium 138 136 - 145 mmol/L    Potassium 4.6 3.5 - 5.1 mmol/L    Chloride 105 98 - 107 mmol/L    CO2 21 (L) 22 - 29 mmol/L    Anion Gap 12 5 - 15 mmol/L    Glucose 213 (H) 74 - 109 mg/dL    BUN 29 (H) 8 - 23 MG/DL    Creatinine 01/03/22 (H) 0.67 - 1.17 MG/DL    Bun/Cre Ratio 20 12 - 20    Est, Glom Filt Rate 54 (L) >60 ml/min/1.57m2    Calcium 9.2 8.8 - 10.2 MG/DL   CBC    Collection Time: 11/03/21  4:59 AM   Result Value Ref Range    WBC 9.3 4.0 - 10.0 K/uL    RBC 3.27 (L) 4.51 - 5.93 M/uL    Hemoglobin 9.7 (L) 13.7 - 17.5 g/dL    Hematocrit 01/03/22 (L) 40.1 - 51.0 %    MCV 91.7 80.0 - 95.0 FL    MCH 29.7 25.6 - 32.2 PG    MCHC 32.3 32.2 - 35.5 g/dL    RDW 16.0 73.7 - 10.6 %    Platelets 293 150 - 400 K/uL    MPV 8.7 (L) 9.4 - 12.4 FL    Nucleated RBCs 0.0 0 - 0.02 PER 100 WBC    nRBC 0.00 K/uL   POCT Glucose    Collection Time: 11/03/21  7:22 AM   Result Value Ref Range    POC Glucose 221 mg/dL    Performed by: 01/03/22  POCT Glucose    Collection Time: 11/03/21 11:16 AM   Result Value Ref Range    POC Glucose 344 mg/dL    Performed by: Meredeth Ide        @MICRORESULTS @          Assessment and Plan:     AKI and bilateral hydronephrosis 2/2 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  Creatinine improving  Urology consulted, appreciate recs   Foley catheter to remain in place indefinitely until outpatient with Urology for cystoscopy and urodynamics  Continue Flomax and finasteride   Hold lisinopril  treat underlying UTI as  below     Urinary tract infection with Enterobacter cloacae.  IV ceftriaxone is discontinued.  His started with ciprofloxacin 100 mg p.o. b.i.d..      Non-obstructing renal/bladder calculi.  Urology evaluation is appreciated.  Urology follow-up as outpatient for further management.    Acute retention of urine possible secondary to BPH and urinary bladder stone.  With Foley's catheter now which will be continued upon discharge.  Continue Flomax 0.4 mg p.o. daily and finasteride 5 mg p.o. daily.  Urology follow-up as outpatient for further management.     Hyperkalemia possible secondary to acute renal failure, resolved earlier.     Mild hyponatremia  Improved earlier with IV fluids     HTN  Hold lisinopril given AKI   Continue Norvasc 10 mg daily and atenolol 50 mg daily     Hx of DM-2  Previously on metformin and glimepiride however discontinued medication after his primary care provider retired.  Will need to resume  Hemoglobin A1c 10.6  ISS and diabetic diet.    Constipation:  Continue bowel regimen.     Severe chronic protein calorie malnutrition   Will follow nutrition recommendation.    Obesity.  Encouraged the patient to get physical exercise as tolerated and maintain healthy food habits.     Deep vein thrombosis prophylaxis with heparin 5000 units subQ q.8 hours.    Offered the patient to call family member to update but he wanted to call himself.    Care Plan discussed with:   [x] Patient   [] Family    [] Care Manager    [x] Nursing   [] Consultant/Specialist :     Disposition:  Anticipated discharge to home tomorrow if remains afebrile changing antibiotics.    This document was generated with the aid of voice recognition software.. Please be aware that there may be inadvertent transcription errors not identified and corrected by the , MD  11/03/2021, 2:56 PM

## 2021-11-03 NOTE — Progress Notes (Signed)
TRANSFER - IN REPORT:    Verbal report received from [place], RN on Dominic Stephens  being received from [facility] for routine progression of patient care      Report consisted of patient's Situation, Background, Assessment and   Recommendations(SBAR).     Information from the following report(s) Nurse Handoff Report was reviewed with the receiving nurse.    Opportunity for questions and clarification was provided.      Assessment completed upon patient's arrival to unit and care assumed.

## 2021-11-03 NOTE — Progress Notes (Signed)
[facility], [place]  CLINICAL NUTRITION    NUTRITION ASSESSMENT   Type of Assessment:  []  Initial    [x]  Follow Up        Current Diet Order: ADULT DIET; Regular; 4 carb choices (60 gm/meal); Low Potassium (Less than 3000 mg/day)  ADULT ORAL NUTRITION SUPPLEMENT; Dinner; Diabetic Oral Supplement   Food Allergies: no known food allergies   Anthropometrics:  Height: 6\' 5"  (195.6 cm)      Admit Weight - Scale: 281 lb 8 oz (127.7 kg)   Most Recent Weight - Scale: 281 lb 8 oz (127.7 kg)   Body mass index is 33.38 kg/m.     Patient Vitals for the past 336 hrs:   Weight   10/31/21 1226 281 lb 8 oz (127.7 kg)          IBW RANGE: 70.8-95.6kg  % IBW: 108%  UBW: 140kg  % UBW: 86%    Adjusted wt: 103.6kg  Weight History: Pt reports his UBW at 140kg approx. 3 mo ago. This is consistent with his wt from 2021. Today pt weight recorded by scale was 123kg. Pt has lost 12.3% UBW in ~3 months.   Wt Readings from Last 30 Encounters:   10/31/21 281 lb 8 oz (127.7 kg)   04/26/19 (!) 308 lb (139.7 kg)       Daily Estimated Needs: based on []  Current wt   []   IBW   []  UBW   [x]  Adjusted wt  Calories (kcals):  2,600-3,100 25-30 kcal/kg/day   Protein (g):  83-103 0.8-1.0g/kg/day   Fluid(ml):  2,600-3,100 30mL/kcal     Pertinent Medications: Reviewed [x]  abx    Labs:  Recent Labs     11/01/21  0318 11/02/21  0319 11/03/21  0459   NA 133* 137 138   K 5.5* 4.7 4.6   CL 105 108* 105   CO2 16* 18* 21*   BUN 52* 36* 29*   CREATININE 2.02* 1.58* 1.45*   CALCIUM 8.8 8.8 9.2   GLUCOSE 259* 157* 213*       Lab Results   Component Value Date/Time    LABA1C 10.6 (H) 10/31/2021 01:36 PM       Recent Labs     11/01/21  1640 11/01/21  2058 11/02/21  0719 11/02/21  1119 11/02/21  1634 11/02/21  2041 11/03/21  0722 11/03/21  1116   POCGLU 234 235 174 225 216 217 221 344        PMH: Reviewed [x]   No past medical history on file. Pt has T2DM.     Meal & Supplement Intake: Pt reports that he is eating better and tolerating the Glucerna shake once  daily.  Patient Vitals for the past 200 hrs:   PO Meals Eaten (%) PO Supplement (%)   11/03/21 0908 51 - 75% --   11/02/21 1834 76 - 100% --   11/02/21 1809 76 - 100% --   11/01/21 1830 76 - 100% 76 - 100%   11/01/21 1305 76 - 100% --   11/01/21 0915 76 - 100% --   Feeding Route:  [x]  Oral  []  Enteral  []  Parenteral    Gastrointestinal: (per nursing flowsheets)  Last Bowel Movement: 10/30/21  GI Symptoms: Constipation    Chewing/Swallowing Issues: n/a    Skin Integrity:   [x]  WDL      []  Pressure Injury:     []  Other:       Nutrition Focused Physical Assessment:  Pt declined physical assessment. Says his face looks thinner than usual, clothes fit loosely, with mild muscle wasting of the trapezius and interosseous muscle. Skin is pale. Pt reports clothes feeling looser over the past month.     Edema: (per nursing flowsheets)   Peripheral Vascular (WDL): Exceptions to WDL  Edema: Right lower extremity, Left lower extremity  RLE Edema: Trace  LLE Edema: Trace    NUTRITION DIAGNOSIS:    Malnutrition (improving)  MALNUTRITION ASSESSMENT AND PLAN    The following was documented by the Dietitian:    Malnutrition Assessment  Context of Malnutrition: Chronic Illness (11/01/21 1242)  Chronic Illness - Energy Intake : 75% or less estimated energy requirements for 1 month or longer (11/01/21 1242)  Chronic Illness - Weight Loss : Greater than 7.5% over 3 months (11/01/21 1242)  Chronic Illness - Body Fat Loss: Mild body fat loss (11/01/21 1242)  Chronic Illness - Body Fat Loss Locations: Orbital (11/01/21 1242)  Chronic Illness - Muscle Mass Loss: Mild muscle mass loss (11/01/21 1242)  Chronic Illness - Muscle Mass Loss Location: Scapula (trapezius);Hand (interosseous) (11/01/21 1242)  Chronic Illness - Fluid Accumulation : No significant fluid accumulation (11/01/21 1242)  Chronic Illness - Malnutrition Score: 16 (11/01/21 1242)  Malnutrition Status: Severe malnutrition (11/01/21 1242)      PES:    Severe chronic malnutrition  related to poor PO intake as evidenced by >7.5% wt loss in ~3 mo and diet recall reveals < or = 75% of estimated energy needs for ~3 mo.     INTERVENTION & PLAN   Food and/or Nutrient Delivery  Continue Diet Regular, CCD (60gm/meal), Low Potassium  Maintain Glucerna Chocolate with dinner      Nutrition Education  Provided encouragement for continued good intake of meals  Brief review of diabetic diet      Coordination of Nutrition Care  N/a  Assessment Summary:   66 yo male presents with AKI now c/o unintentional wt loss for ~3 months. PMHx is notable for kidney stones and T2DM.     Visited pt in his room for nutrition follow up visit. Pt reports that he is feeling better and eating better. He is enjoying the food served and tolerating the Glucerna shakes. Will continue with the current nutrition plan of care.  Goal(s):  Consume 100% of Glucerna at dinnertime(meeting)  Consume >75% of meals(meeting)    MONITORING & EVALUATION   Follow Up Date: 11/09/21    Priority Level of Care: []  High []  Moderate [x]  Low

## 2021-11-04 LAB — BASIC METABOLIC PANEL
Anion Gap: 11 mmol/L (ref 5–15)
BUN: 31 MG/DL — ABNORMAL HIGH (ref 8–23)
Bun/Cre Ratio: 22 — ABNORMAL HIGH (ref 12–20)
CO2: 21 mmol/L — ABNORMAL LOW (ref 22–29)
Calcium: 9.4 MG/DL (ref 8.8–10.2)
Chloride: 106 mmol/L (ref 98–107)
Creatinine: 1.43 MG/DL — ABNORMAL HIGH (ref 0.67–1.17)
Est, Glom Filt Rate: 55 mL/min/{1.73_m2} — ABNORMAL LOW (ref >60)
Glucose: 186 mg/dL — ABNORMAL HIGH (ref 74–109)
Potassium: 4.3 mmol/L (ref 3.5–5.1)
Sodium: 138 mmol/L (ref 136–145)

## 2021-11-04 LAB — HEMOGLOBIN AND HEMATOCRIT
Hematocrit: 33.5 % — ABNORMAL LOW (ref 40.1–51.0)
Hemoglobin: 10.4 g/dL — ABNORMAL LOW (ref 13.7–17.5)
MCH: 28.6 PG (ref 25.6–32.2)
MCHC: 31 g/dL — ABNORMAL LOW (ref 32.2–35.5)

## 2021-11-04 LAB — POCT GLUCOSE
POC Glucose: 182 mg/dL
POC Glucose: 192 mg/dL
POC Glucose: 290 mg/dL

## 2021-11-04 MED ORDER — ONDANSETRON 4 MG PO TBDP
4 MG | ORAL_TABLET | Freq: Three times a day (TID) | ORAL | 0 refills | Status: DC | PRN
Start: 2021-11-04 — End: 2022-04-01

## 2021-11-04 MED ORDER — ACETAMINOPHEN 325 MG PO TABS
325 MG | ORAL_TABLET | Freq: Four times a day (QID) | ORAL | 0 refills | Status: DC | PRN
Start: 2021-11-04 — End: 2022-04-03

## 2021-11-04 MED ORDER — TAMSULOSIN HCL 0.4 MG PO CAPS
0.4 MG | ORAL_CAPSULE | Freq: Every day | ORAL | 1 refills | Status: DC
Start: 2021-11-04 — End: 2021-11-28

## 2021-11-04 MED ORDER — CIPROFLOXACIN HCL 500 MG PO TABS
500 MG | ORAL_TABLET | Freq: Two times a day (BID) | ORAL | 0 refills | Status: AC
Start: 2021-11-04 — End: 2021-11-16

## 2021-11-04 MED ORDER — DSS 100 MG PO CAPS
100 MG | ORAL_CAPSULE | Freq: Two times a day (BID) | ORAL | 0 refills | Status: AC
Start: 2021-11-04 — End: 2022-04-01

## 2021-11-04 MED ORDER — METFORMIN HCL 500 MG PO TABS
500 MG | ORAL_TABLET | Freq: Two times a day (BID) | ORAL | 0 refills | Status: AC
Start: 2021-11-04 — End: 2022-04-01

## 2021-11-04 MED ORDER — FINASTERIDE 5 MG PO TABS
5 MG | ORAL_TABLET | Freq: Every day | ORAL | 1 refills | Status: AC
Start: 2021-11-04 — End: 2022-04-01

## 2021-11-04 MED ORDER — SODIUM CHLORIDE 0.9 % IV SOLN
0.9 % | Freq: Once | INTRAVENOUS | Status: AC
Start: 2021-11-04 — End: 2021-11-04
  Administered 2021-11-04: 14:00:00 via INTRAVENOUS

## 2021-11-04 MED ORDER — POLYETHYLENE GLYCOL 3350 17 G PO PACK
17 g | PACK | Freq: Every day | ORAL | 0 refills | Status: AC | PRN
Start: 2021-11-04 — End: 2021-12-04

## 2021-11-04 MED FILL — AMLODIPINE BESYLATE 10 MG PO TABS: 10 MG | ORAL | Qty: 1

## 2021-11-04 MED FILL — CIPROFLOXACIN HCL 250 MG PO TABS: 250 MG | ORAL | Qty: 2

## 2021-11-04 MED FILL — DOCUSATE SODIUM 100 MG PO CAPS: 100 MG | ORAL | Qty: 1

## 2021-11-04 MED FILL — TAMSULOSIN HCL 0.4 MG PO CAPS: 0.4 MG | ORAL | Qty: 1

## 2021-11-04 MED FILL — ATENOLOL 50 MG PO TABS: 50 MG | ORAL | Qty: 1

## 2021-11-04 MED FILL — HEPARIN SODIUM (PORCINE) 5000 UNIT/ML IJ SOLN: 5000 UNIT/ML | INTRAMUSCULAR | Qty: 1

## 2021-11-04 MED FILL — FINASTERIDE 5 MG PO TABS: 5 MG | ORAL | Qty: 1

## 2021-11-04 MED FILL — SODIUM CHLORIDE 0.9 % IV SOLN: 0.9 % | INTRAVENOUS | Qty: 1000

## 2021-11-04 NOTE — Discharge Summary (Signed)
Discharge Summary       Patient: Dominic Stephens Age: 66 y.o. DOB: Nov 23, 1955 MR#: [id number] SSN: [id number]  PCP on record: Burnett Corrente, MD  Admit date: 10/31/2021  Discharge date: 11/04/2021  Code Status:  Full code.    Consults:    1. Urologist Dr. Ethelene Hal    Discharge diet: 2 G sodium, low cholesterol, 1800 ADA diet.    Discharge activity:  As tolerated.    Discharge disposition:  To home.    Discharge Diagnoses:     1.  Acute renal failure, prerenal possible due to combination of dehydration and obstructive uropathy.  2. Obstructive uropathy with bilateral hydroureter and hydronephrosis.    3. Small urinary bladder stone.    4. Right renal lower pole nonobstructive stone.  5. Acute retention of urine due to BPH and possible bladder stone.    6. Urinary tract infection with Enterobacter cloacae.  7. Hyperkalemia possible secondary to acute renal failure, resolved.  8. Hyponatremia, mild, possible secondary to poor p.o. intake, improved.  9. Hypertension.    10. Diabetes mellitus, type 2 history.    11. Constipation.    12. Severe chronic protein calorie malnutrition.    13. Obesity.        Hospital Course by Problem   Dominic Stephens is a 66 y.o. male with PMH of HTN on meds, BPH no longer taking meds, DM-2 no longer taking meds, renal calculi and recently experienced symptoms of persistent urinary frequency, urgency, and cloudy urine. He had a urinalysis growing Enterobacter cloacae 08/2021 initially placed on cefpodoxime to which the bacteria was susceptible.  His symptoms persisted beyond the length of the antibiotic and he was placed on 10 days of Macrobid which culture showed should have been susceptible.  After completion of the medication, he continued with symptoms.  He had some old Augmentin and took 7 days of that.  He never experienced resolution of his symptoms.  His antibiotics were completed approximately 3 weeks ago.  He has not developed any new abdominal pain or back pain.  He has not  had fever or chills.  He is coming in today because over the course of the past 2-3 months he has had progressive and now profound fatigue, and had a progressive decrease in appetite with poor oral intake, and reports a 40 lb weight loss.  Two months ago he developed a dry cough and reports postnasal drip.  No shortness of breath, chest pain, orthopnea, peripheral edema.  No N/V, diarrhea or constipation.  Denies any current urinary complaints. He is a never smoker.  He denies alcohol or street drugs. Independent at baseline.  Stopped taking his BPH (flomax and finasteride) and DM meds (metformin) of his own accord many years ago.  He doesn't routinely follow up with a PCP and hasn't seen his urologist recently either.  Hospital course:  During the admission evaluation, he was started with IV fluid, IV ceftriaxone, Flomax, finasteride.  Urologist evaluated the patient on consultation.  His placed with Foley's catheter which will be continued upon discharge.  Acute renal failure, electrolyte imbalance is improved..  11/04/2021-I evaluated the patient in the morning with RN Jonah Blue.  The patient was found comfortable.  Afebrile.  Not hypoxic.  Denied chest pain.  No shortness of breathing.  No other acute medical complaints.    I discussed with the patient about discharge, followup and rehab plan.  All questions were answered.  The patient thought that his still  dehydrated and requested IV fluid infusion before discharge today.  To satisfy his request, he was started with 0.9% sodium chloride 500 mL at 100 mL/hour.  IV fluid infusion was discontinued after 200 mL infusion as patient requested to discontinue IV infusion as per RN Alver Fisher.  IV ceftriaxone was changed to p.o. ciprofloxacin 500 mg b.i.d. from yesterday night as per urine culture and sensitivity which will be continued upon discharge  I offered the patient to call family member to update about his discharge today but wanted to call himself.   Accordingly, the patient is discharged to home today.      Today's examination of the patient revealed:     Subjective:   Awake, alert, oriented x3.  Afebrile.  Not in acute pain or distress.  No acute medical complaints.  Objective:   VS: BP 136/60   Pulse 72   Temp 97.5 F (36.4 C) (Oral)   Resp 17   Ht 6\' 5"  (1.956 m)   Wt 270 lb (122.5 kg)   SpO2 97%   BMI 32.02 kg/m    Tmax/24hrs: Temp (24hrs), Avg:97.5 F (36.4 C), Min:97.1 F (36.2 C), Max:98 F (36.7 C)     Input/Output:   Intake/Output Summary (Last 24 hours) at 11/04/2021 1232  Last data filed at 11/04/2021 0528  Gross per 24 hour   Intake 600 ml   Output 3050 ml   Net -2450 ml       Physical Exam:   BP 136/60   Pulse 72   Temp 97.5 F (36.4 C) (Oral)   Resp 17   Ht 6\' 5"  (1.956 m)   Wt 270 lb (122.5 kg)   SpO2 97%   BMI 32.02 kg/m       General:  Alert, cooperative, no distress, appears stated age.  Oriented x3.   Head:  Normocephalic, without obvious abnormality, atraumatic.   Eyes:  Conjunctivae/corneas clear.  EOMs intact.    Ears:  Normal external ear canals both ears.   Nose: Nares normal. No drainage   Throat: Lips, mucosa, and tongue normal.   Neck: Supple, symmetrical, trachea midline, no adenopathy, thyroid: no enlargement/tenderness/nodules, no carotid bruit and no JVD.   Back:   No CVA tenderness.   Lungs:   Clear to auscultation bilaterally.  No wheezing, no rales.   Chest wall:  No  deformity.   Heart:  Regular rate and rhythm, S1, S2 normal, no murmur, click, rub or gallop.   Breast Exam:  No tenderness, masses, or nipple abnormality as per patient.  Not examined..   Abdomen:   Soft, non-tender. Bowel sounds normal. No masses,  No organomegaly.  Obese.   Genitalia:  Normal without lesion, discharge or tenderness.  With Foley's catheter..   Rectal:  Nothing abnormal as per the patient not examined..   Extremities: Extremities normal, atraumatic, no cyanosis or edema.   Pulses: 2+ and symmetric all extremities.   Skin: Skin  color, texture, turgor normal. No rashes or lesions.   Lymph nodes: Cervical, supraclavicular, and axillary nodes normal.   Neurologic: CNII-XII and peripheral nervous system are intact.    Psychiatric evaluation:  Cooperative, pleasant, not depressed.             Labs:    Recent Results (from the past 24 hour(s))   POCT Glucose    Collection Time: 11/03/21  4:20 PM   Result Value Ref Range    POC Glucose 223 mg/dL    Performed by: Tana Felts  POCT Glucose    Collection Time: 11/03/21  6:38 PM   Result Value Ref Range    POC Glucose 221 mg/dL    Performed by: Clarene Essex    POCT Glucose    Collection Time: 11/03/21  9:09 PM   Result Value Ref Range    POC Glucose 182 mg/dL    Performed by: Lamar Blinks    Basic Metabolic Panel    Collection Time: 11/04/21  4:50 AM   Result Value Ref Range    Sodium 138 136 - 145 mmol/L    Potassium 4.3 3.5 - 5.1 mmol/L    Chloride 106 98 - 107 mmol/L    CO2 21 (L) 22 - 29 mmol/L    Anion Gap 11 5 - 15 mmol/L    Glucose 186 (H) 74 - 109 mg/dL    BUN 31 (H) 8 - 23 MG/DL    Creatinine 5.62 (H) 0.67 - 1.17 MG/DL    Bun/Cre Ratio 22 (H) 12 - 20    Est, Glom Filt Rate 55 (L) >60 ml/min/1.74m2    Calcium 9.4 8.8 - 10.2 MG/DL   Hemoglobin and Hematocrit    Collection Time: 11/04/21  4:50 AM   Result Value Ref Range    Hemoglobin 10.4 (L) 13.7 - 17.5 g/dL    Hematocrit 13.0 (L) 40.1 - 51.0 %    MCH 28.6 25.6 - 32.2 PG    MCHC 31.0 (L) 32.2 - 35.5 g/dL   POCT Glucose    Collection Time: 11/04/21  7:26 AM   Result Value Ref Range    POC Glucose 192 mg/dL    Performed by: Sara Chu    POCT Glucose    Collection Time: 11/04/21 11:39 AM   Result Value Ref Range    POC Glucose 290 mg/dL    Performed by: Sara Chu        @ANTICOAGULANTS @          No results found for: "INR", "PTMR", "PT1"     Additional Data Reviewed:     Last 24hr Input/Output:    Intake/Output Summary (Last 24 hours) at 11/04/2021 1232  Last data filed at 11/04/2021 01/04/2022  Gross per 24 hour   Intake 600 ml    Output 3050 ml   Net -2450 ml        @MICRORESULTS @         Condition:   Disposition:    [x] Home   [] Home with Home Health   [] SNF/NH   [] Rehab   [] Home with family   [] Alternate Facility:____________________      Discharge Medications:     Current Discharge Medication List        START taking these medications    Details   acetaminophen (TYLENOL) 325 MG tablet Take 2 tablets by mouth every 6 hours as needed for Pain  Qty: 120 tablet, Refills: 0      ondansetron (ZOFRAN-ODT) 4 MG disintegrating tablet Take 1 tablet by mouth every 8 hours as needed for Nausea or Vomiting  Qty: 30 tablet, Refills: 0      ciprofloxacin (CIPRO) 500 MG tablet Take 1 tablet by mouth 2 times daily for 12 days  Qty: 12 tablet, Refills: 0      docusate sodium (COLACE, DULCOLAX) 100 MG CAPS Take 100 mg by mouth 2 times daily  Qty: 60 capsule, Refills: 0      polyethylene glycol (GLYCOLAX) 17 g packet Take 1 packet by mouth daily as needed for  Constipation  Qty: 30 packet, Refills: 0           CONTINUE these medications which have CHANGED    Details   metFORMIN (GLUCOPHAGE) 500 MG tablet Take 1 tablet by mouth in the morning and at bedtime  Qty: 60 tablet, Refills: 0      finasteride (PROSCAR) 5 MG tablet Take 1 tablet by mouth daily  Qty: 30 tablet, Refills: 1      tamsulosin (FLOMAX) 0.4 MG capsule Take 1 capsule by mouth daily  Qty: 30 capsule, Refills: 1           CONTINUE these medications which have NOT CHANGED    Details   amLODIPine (NORVASC) 10 MG tablet TAKE 1 TABLET BY MOUTH ONCE DAILY      atenolol (TENORMIN) 50 MG tablet TAKE 1 TABLET BY MOUTH TWICE DAILY      lisinopril (PRINIVIL;ZESTRIL) 5 MG tablet Take 1 tablet by mouth daily           STOP taking these medications       Lancets MISC Comments:   Reason for Stopping:         doxycycline monohydrate (ADOXA) 100 MG tablet Comments:   Reason for Stopping:                 Follow-up Appointments:   1. Your PCP: Burnett Corrente, MD, in 5 days as per appointment.    2. Follow-up  with urologist Dr. Ethelene Hal in 7-10 days as per appointment.    Call for appointments    Special Instruction:     1. Continue medications regularly as advised.    2. Foley's catheter care as per protocol.    3. Get physical exercise as tolerated and maintain healthy food habits for obesity.    4. Return in the emergency room if experiences acute chest pain, acute shortness of breathing, recurrent fever.    Communications:     1. I have forwarded the discharge summary electronically to the primary care physician, Barley, Lorelle Gibbs, MD,    Care Plan discussed with:   [x] Patient   [] Family    [] Care Manager    [x] Nursing   [] Consultant/Specialist :     Disposition:  The patient is discharged to home today.    Total time spent for completion of discharge process,: More than 50 minutes.  >30 minutes spent coordinating this discharge (review instructions/follow-up, prescriptions, preparing report for sign off)      This document was generated with the aid of voice recognition software.. Please be aware that there may be inadvertent transcription errors not identified and corrected by the author .    . , MD

## 2021-11-04 NOTE — Discharge Instructions (Addendum)
Discharge diet: 2 G sodium, low cholesterol, 1800 ADA diet.    Discharge activity:  Tolerated.    Follow-up plan:  1. Follow up with primary care physician in 5 days as per appointment.    2. Follow-up with urologist Dr. Marcie Mowers in 7-10 days as per appointment.    Call for appointments.    Special instructions:  1. Continue medications regularly as advised.    2. Foley's catheter care as per protocol.    3. Get physical exercise as tolerated and maintain healthy food habits for obesity.    4. Return in the emergency room if experiences acute chest pain, acute shortness of breathing, recurrent fever.

## 2021-11-04 NOTE — Plan of Care (Signed)
Problem: Chronic Conditions and Co-morbidities  Goal: Patient's chronic conditions and co-morbidity symptoms are monitored and maintained or improved  11/04/2021 1235 by Lesle Reek, RN  Outcome: Adequate for Discharge       Problem: Safety - Adult  Goal: Free from fall injury  11/04/2021 1235 by Lesle Reek, RN  Outcome: Adequate for Discharge

## 2021-11-05 NOTE — Telephone Encounter (Signed)
INCOMING CALL NOTE     Caller Name:  [place]    Relationship of Caller to Patient:       Return number:  [phone]    Reason for Call:  Pt former PCP was M. Messier.  He was assigned to P Barley but has never seen him and particularly wants to have Dr. Daleen Squibb as his new PCP.    He was hospitalized at [facility] for UTI that required IV antibiotics and close monitoring.  He still has urinary cath.  Requesting follow up visit - appt made for 11/07/21.      Is the reason for call a hot word listed on the triage list?  No     Call Notes:      Staff Member Action:  Nurse que    -------------    Is a visit being scheduled within the next 48 hours?  No   If yes, please ensure that the "Travel Screening" questionnaire is completed on the phone with the patient.

## 2021-11-05 NOTE — Telephone Encounter (Signed)
INCOMING CALL NOTE      Caller Name:  Dominic Stephens     Relationship of Caller to Patient:        Return number:  [phone]     Reason for Call:  Pt former PCP was M. Messier.  He was assigned to P Barley but has never seen him and particularly wants to have Dr. Daleen Squibb as his new PCP.     He was hospitalized at [facility] for UTI that required IV antibiotics and close monitoring.  He still has urinary cath.  Requesting follow up visit - appt made for 11/07/21.       Is the reason for call a hot word listed on the triage list?  No      Call Notes:       Staff Member Action:  Front desk     -------------     Is a visit being scheduled within the next 48 hours?  No   If yes, please ensure that the "Travel Screening" questionnaire is completed on the phone with the patient.

## 2021-11-05 NOTE — Telephone Encounter (Signed)
Facility Name: [facility]  Type of Admission: Hospital admit   (e.g., Hospital admission, skilled nursing facility (SNF) admission, acute rehab facility admission or ED/ER visit)  Diagnosis: UTI - kidneys affected  Discharge Date: 11/04/21  Date of Follow-Up Visit: 11/07/21    Internal transfer NP    - - - - - - - - - - - - - - - - - - - - - -     Staff Member After-Call Checklist:  Route this message to the Practice's REFERRAL COORDINATOR or MEDICAL RECORDS pool.

## 2021-11-07 ENCOUNTER — Ambulatory Visit: Admit: 2021-11-07 | Discharge: 2021-11-07 | Payer: MEDICARE | Attending: Internal Medicine | Primary: Internal Medicine

## 2021-11-07 ENCOUNTER — Inpatient Hospital Stay: Admit: 2021-11-07 | Primary: Internal Medicine

## 2021-11-07 DIAGNOSIS — N411 Chronic prostatitis: Secondary | ICD-10-CM

## 2021-11-07 DIAGNOSIS — A498 Other bacterial infections of unspecified site: Secondary | ICD-10-CM

## 2021-11-07 DIAGNOSIS — E1165 Type 2 diabetes mellitus with hyperglycemia: Secondary | ICD-10-CM

## 2021-11-07 LAB — COMPREHENSIVE METABOLIC PANEL
ALT: 24 U/L (ref 0–50)
AST: 27 U/L (ref 0–50)
Albumin/Globulin Ratio: 1 (ref 1.0–3.0)
Albumin: 4 g/dL (ref 3.5–5.2)
Alk Phosphatase: 104 U/L (ref 40–129)
Anion Gap: 11 mmol/L (ref 5–15)
BUN: 37 MG/DL — ABNORMAL HIGH (ref 8–23)
Bun/Cre Ratio: 22 — ABNORMAL HIGH (ref 12–20)
CO2: 22 mmol/L (ref 22–29)
Calcium: 9.6 MG/DL (ref 8.8–10.2)
Chloride: 101 mmol/L (ref 98–107)
Creatinine: 1.67 MG/DL — ABNORMAL HIGH (ref 0.67–1.17)
Est, Glom Filt Rate: 45 mL/min/{1.73_m2} — ABNORMAL LOW (ref >60)
Glucose: 285 mg/dL — ABNORMAL HIGH (ref 74–109)
Potassium: 4.7 mmol/L (ref 3.5–5.1)
Sodium: 134 mmol/L — ABNORMAL LOW (ref 136–145)
Total Bilirubin: 0.27 mg/dL (ref 0–1.20)
Total Protein: 8.1 g/dL (ref 6.4–8.3)

## 2021-11-07 LAB — CBC
Hematocrit: 36.4 % — ABNORMAL LOW (ref 40.1–51.0)
Hemoglobin: 11.7 g/dL — ABNORMAL LOW (ref 13.7–17.5)
MCH: 29.3 PG (ref 25.6–32.2)
MCHC: 32.1 g/dL — ABNORMAL LOW (ref 32.2–35.5)
MCV: 91 FL (ref 80.0–95.0)
MPV: 8.4 FL — ABNORMAL LOW (ref 9.4–12.4)
Nucleated RBCs: 0 PER 100 WBC (ref 0–0.02)
Platelets: 315 10*3/uL (ref 150–400)
RBC: 4 M/uL — ABNORMAL LOW (ref 4.51–5.93)
RDW: 13.5 % (ref 11.6–14.4)
WBC: 8.9 10*3/uL (ref 4.0–10.0)
nRBC: 0 10*3/uL

## 2021-11-07 LAB — AMB POC GLUCOSE BLOOD, BY GLUCOSE MONITORING DEVICE: Glucose, POC: 308 MG/DL

## 2021-11-07 MED ORDER — GLIMEPIRIDE 2 MG PO TABS
2 MG | ORAL_TABLET | Freq: Two times a day (BID) | ORAL | 1 refills | Status: DC
Start: 2021-11-07 — End: 2021-11-28

## 2021-11-07 MED ORDER — ONETOUCH ULTRA VI STRP
3 refills | Status: AC
Start: 2021-11-07 — End: ?

## 2021-11-07 MED ORDER — ONETOUCH ULTRASOFT LANCETS MISC
3 refills | Status: AC
Start: 2021-11-07 — End: ?

## 2021-11-07 MED ORDER — ONETOUCH ULTRA 2 W/DEVICE KIT
PACK | 0 refills | Status: AC
Start: 2021-11-07 — End: ?

## 2021-11-07 NOTE — Progress Notes (Signed)
Subjective:     Dominic Stephens is a 66 y.o. male who presents for hospital follow-up  Patient Active Problem List   Diagnosis    Bladder stones    Chronic prostatitis    Renal stones    Gross hematuria    Diagnosis unknown    Benign prostatic hyperplasia with weak urinary stream    Impotence of organic origin    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    AKI (acute kidney injury) (Dominic Stephens)    Unspecified severe protein-calorie malnutrition (HCC)    Type 2 diabetes mellitus with hyperglycemia, without long-term current use of insulin (HCC)    Type 2 diabetes mellitus    Primary hypertension       HPI:  This is a 66 year old white male with a past medical history significant for hypertension BPH type 2 diabetes kidney stones he developed an Enterobacter infection August was placed on 10 days of Macrobid a symptoms however continued beyond length of antibiotic treatment he then took Augmentin for 7 days there was no resolution of symptoms with that either he arrived to the emergency room because of profound weakness poor oral intake 40 lb weight loss decreased urine flow.  He was admitted and started on IV fluids IV ceftriaxone Flomax finasteride urology evaluated the patient placed a Foley catheter acute renal failure electrolyte imbalance improved he was discharged home on ciprofloxacin.  Has a urinary catheter in place.  His diabetes was treated with insulin coverage during the hospital stay since going home he has not taken any medicines for his diabetes         ENT: Denies earache, decreased hearing, nasal congestion, sore throat.   Cardiovascular: Denies chest pains, palpitations, lightheadedness or dizziness    Respiratory: Denies cough, shortness of breath.  GI: Denies constipation, diarrhea or change in bowel habits. Denies abdominal discomfort.   GU: catheter in place   Msk: No muscle or joint pain.   PSYCH: Mood good.      Current Outpatient Medications:     blood glucose test strips (ONETOUCH ULTRA)  strip, Test sugars once daily E11.65, Disp: 100 each, Rfl: 3    Blood Glucose Monitoring Suppl (ONE TOUCH ULTRA 2) w/Device KIT, Test sugars once daily E11.65, Disp: 1 kit, Rfl: 0    ONE TOUCH ULTRASOFT LANCETS MISC, Test sugars once daily E11.65, Disp: 100 each, Rfl: 3    glimepiride (AMARYL) 2 MG tablet, Take 1 tablet by mouth 2 times daily (with meals), Disp: 180 tablet, Rfl: 1    acetaminophen (TYLENOL) 325 MG tablet, Take 2 tablets by mouth every 6 hours as needed for Pain, Disp: 120 tablet, Rfl: 0    metFORMIN (GLUCOPHAGE) 500 MG tablet, Take 1 tablet by mouth in the morning and at bedtime, Disp: 60 tablet, Rfl: 0    ondansetron (ZOFRAN-ODT) 4 MG disintegrating tablet, Take 1 tablet by mouth every 8 hours as needed for Nausea or Vomiting, Disp: 30 tablet, Rfl: 0    ciprofloxacin (CIPRO) 500 MG tablet, Take 1 tablet by mouth 2 times daily for 12 days, Disp: 12 tablet, Rfl: 0    docusate sodium (COLACE, DULCOLAX) 100 MG CAPS, Take 100 mg by mouth 2 times daily, Disp: 60 capsule, Rfl: 0    polyethylene glycol (GLYCOLAX) 17 g packet, Take 1 packet by mouth daily as needed for Constipation, Disp: 30 packet, Rfl: 0    finasteride (PROSCAR) 5 MG tablet, Take 1 tablet by mouth daily, Disp: 30 tablet, Rfl: 1  tamsulosin (FLOMAX) 0.4 MG capsule, Take 1 capsule by mouth daily, Disp: 30 capsule, Rfl: 1    amLODIPine (NORVASC) 10 MG tablet, TAKE 1 TABLET BY MOUTH ONCE DAILY, Disp: , Rfl:     atenolol (TENORMIN) 50 MG tablet, TAKE 1 TABLET BY MOUTH TWICE DAILY, Disp: , Rfl:       Objective:     BP (!) 90/58   Pulse 64   Resp 18   Ht 1.956 m (6' 5" )   Wt 121.1 kg (267 lb)   BMI 31.66 kg/m       Physical Exam   WDWN white male in NAD  HEENT normal  Neck supple no JVD  Heart S1S2  Lungs clear to P and A  Abd soft BS positive  Ext no CCE    Assessment/Plan:     1. Infection due to Enterobacter cloacae  2. Urinary tract infection without hematuria, site unspecified  3. AKI (acute kidney injury) ([place])  4. Hydronephrosis  due to obstruction of bladder  5. Urinary retention  6. Type 2 diabetes mellitus with hyperglycemia, without long-term current use of insulin (HCC)  -     AMB POC GLUCOSE BLOOD, BY GLUCOSE MONITORING DEVICE  -     blood glucose test strips (ONETOUCH ULTRA) strip; Test sugars once daily E11.65, Disp-100 each, R-3Normal  -     Blood Glucose Monitoring Suppl (ONE TOUCH ULTRA 2) w/Device KIT; Disp-1 kit, R-0, NormalTest sugars once daily E11.65  -     ONE TOUCH ULTRASOFT LANCETS MISC; Disp-100 each, R-3, NormalTest sugars once daily E11.65  -     glimepiride (AMARYL) 2 MG tablet; Take 1 tablet by mouth 2 times daily (with meals), Disp-180 tablet, R-1Normal  7. Primary hypertension     Enterobacter cloacae  urinary tract infection being treated with Cipro   patient had acute kidney injury with hydronephrosis due to urinary retention  Had large volume residual still has Foley catheter in place will need urology follow-up in urodynamic studies  Type 2 diabetes mellitus patient needs to start to control his blood sugar for step would be to monitor blood sugar fasting and after meals and at bedtime he was given a prescription for glimepiride to take 2 mg 1 twice a day with meals he should call with blood sugar values I did introduce him to care coordination and will work on getting his urology appointment moved up 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    This document was generated with the aid of voice  recognition software.  Please be aware that there may be inadvertent transcription errors not identified and corrected by the author.     Varney Biles, MD  11/13/21

## 2021-11-08 LAB — C-PEPTIDE: C-Peptide: 7 ng/mL — ABNORMAL HIGH (ref 1.1–4.4)

## 2021-11-08 NOTE — Care Coordination-Inpatient (Signed)
No return tc from patient before hospital follow up visit on 10/11.  Per request of PCP this cc met with patient and his wife.  Patient did appear uncomfortable due to foley catheter, he does have a follow up with Urology scheduled for the end of the month with the NP from [facility] in [place], [place] T: [place] Fax: [phone].  Patient is asking for a sooner visit.    TC to Urology office vm message advises email to [email or web address].    I have sent an e-mail requesting a sooner office vist.  Per patient's wife they are leaving for vacation the first week in November.    Return tc from [place] in scheduling she is asking if this follow up is for a voiding trial and catheter removal, read over EMR d/c MD note reads foley catheter to remain in place indefinitely until outpatient follow up with urology.  Wells Guiles was able to reschedule patient for Tuesday 10/17 at 2:45 with Dr. Mathews Robinsons, per Wells Guiles this is a consult for hospital follow up. Cystoscopy and urodynamics testing can be discussed at that time.  TC to Robi to make him aware, he plans to attend.

## 2021-11-13 NOTE — Assessment & Plan Note (Signed)
Associated Problem(s): Urinary retention  Formatting of this note might be different from the original.   Urinary retention, chronic problem not a treatment goal recent exacerbation with renal failure and UTI and large volume retention.   I discussed there is likely some chronic bladder damage I recommended CIC he is not interested in this I recommend further workup to see if he is appropriate for prostate surgery urodynamics and cystoscopy.      Continue Foley catheter due for exchange late October early November, follow-up for urodynamics and cystoscopy for potential surgical planning  Electronically signed by Clayborn Heron Ridyard, MD at 11/13/2021  3:42 PM EDT

## 2021-11-13 NOTE — Telephone Encounter (Signed)
Formatting of this note might be different from the original.  ----- Message from [facility] sent at 11/13/2021  3:23 PM EDT -----  Regarding: LABS  Hi,    Patient wanted to know if you can put a creatinine order in  to check his kidney function. [facility] for labs. He will also be off his antibiotics by Friday. Does he need to come in and provide a  urine sample 72 hrs later? If that's the case does his cath change need to be done before or after sample? They are also going away Nov 3rd and UDS is scheduled the 14 th. Can they provide urine sample earlier then 10 days or be placed on ABX a precaution? I'll call an update with answers.     Thanks,  Deidre Ala     Addendum:  Creatinine ordered  He  does not need a repeat urine  test until before his urodynamics I would have him get a urine culture 1 week before urodynamics    Electronically signed by Jacklynn Barnacle, MD at 11/13/2021  4:57 PM EDT

## 2021-11-13 NOTE — Progress Notes (Signed)
Formatting of this note is different from the original.  HISTORY with reviewed data and ASSESSMENT/PLAN:     Dominic Stephens is a 66 year old year old male who presents with:    Here for urinary retention, recently admitted to [facility], data from his admission reviewed below. Chronic retention dating back to 2016 seen by Dr. Cathrine Muster, history of recurrent UTI, bladder stones. Leading up to admission to [facility] he had worsening LUTS and cloudy urine. He is now on cipro now for enterobacter UTI. He takes flomax, does not think he has been on finasteride for some time.     Urinary retention  Overview:   Patient recently admitted to [facility], reviewed discharge summary 11/04/21, patient was admitted with urinary retention, renal failure, UTI.  Inpatient consult performed by Dr. Dimas Chyle.  Patient has history of BPH bladder stones bilateral hydronephrosis and recurrent UTI.  Was in urinary retention for 2.5 L. most recent creatinine 1.67 on 11/07/21, was 2.57 when he presented, PSA on 11/01/21 1.50, urine culture grew 100,000 Enterobacter.    CT scan 10/31/21 bilateral hydroureteronephrosis small stones in lower pole of left kidney no ureteral stones massively distended bladder, prostate stones possible bladder stone this may be outside of the bladder or in a diverticulum looks to be about 1.5 cm prostate estimated 40 g    DRE 11/13/21: 30 g benign    Assessment & Plan:   Urinary retention, chronic problem not a treatment goal recent exacerbation with renal failure and UTI and large volume retention.   I discussed there is likely some chronic bladder damage I recommended CIC he is not interested in this I recommend further workup to see if he is appropriate for prostate surgery urodynamics and cystoscopy.      Continue Foley catheter due for exchange late October early November, follow-up for urodynamics and cystoscopy for potential surgical planning        Past Medical History:   Diagnosis Date    Calculus  of kidney     Essential hypertension     Pure hypercholesterolemia     Type 1 diabetes mellitus      Past Surgical History:   Procedure Laterality Date    LAMINECTOMY,LUMBAR       Allergies: Bactrim [sulfamethoxazole w-trimethoprim], Other, and Sulfa drugs  Current Outpatient Medications   Medication Sig    acetaminophen (TYLENOL) 325 MG Oral Tab Take 650 mg by mouth.    amLODIpine (NORVASC) 5 MG Oral Tab Take 5 mg by mouth DAILY.    atenolol (TENORMIN) 25 MG Oral Tab Take 25 mg by mouth TWICE A DAY.      ciprofloxacin (CIPRO) 500 MG Oral Tab TAKE 1 TABLET BY MOUTH TWICE DAILY FOR 12 DAYS    glipiZIDE (GLUCOTROL) 5 MG Oral Tab     ibuprofen (MOTRIN) 200 MG Oral Tab Take 200 mg by mouth EVERY 6 HOURS AS NEEDED. Take with food (Patient not taking: Reported on 11/13/2021)    lisinopril (PRINIVIL) 10 MG Oral Tab Take 10 mg by mouth DAILY. (Patient not taking: Reported on 11/13/2021)    tamsulosin (FLOMAX) 0.4 MG Oral Cap Take 0.4 mg by mouth AFTER BREAKFAST. Take 30 minutes after the same meal daily     No current facility-administered medications for this visit.     Social History    Tobacco Use      Smoking status: Never    Alcohol use: No    Drug use: No  Social History     Social History Narrative    Not on file     Family History   Problem Relation Name Age of Onset    Hypertension Mother      Diabetes Mother      High Cholesterol Mother      Hypertension Father      Diabetes Father      High Cholesterol Father      Cancer Negative FH      Heart Attack Negative FH      Stroke Negative FH      Heart disease under the age of 68 Negative FH      Gout Negative FH      Kidney Stones Negative FH       Review of Systems   Constitutional:  Negative for appetite change, chills, fatigue, fever and unexpected weight change.   HENT:  Negative for congestion, hearing loss and tinnitus.    Eyes:  Negative for visual disturbance.   Respiratory:  Negative for cough, shortness of breath and wheezing.    Cardiovascular:  Negative  for chest pain, palpitations and leg swelling.   Gastrointestinal:  Negative for abdominal pain, constipation, diarrhea, nausea and vomiting.   Endocrine: Negative for polydipsia and polyphagia.   Genitourinary:  Negative for difficulty urinating, dysuria, frequency, hematuria, penile discharge, testicular pain and urgency.   Musculoskeletal:  Negative for arthralgias and back pain.   Skin:  Negative for rash and wound.   Neurological:  Negative for dizziness, weakness and numbness.   Hematological:  Does not bruise/bleed easily.   Psychiatric/Behavioral:  Negative for confusion.      OBJECTIVE:     Visit Vitals  Ht 6\' 5"  (1.956 m)   Wt 208 lb (94.3 kg) Comment: pt confirmed   BMI 24.67 kg/m     Physical Exam   Constitutional: No distress.   Head: Normocephalic and atraumatic.   Neck: Normal range of motion.   Pulmonary/Chest: Effort normal.   Abdominal: Soft. Non tender   GU: no CVA tenderness  Extremities: no edema or deformity  Neurological:normal speech  Skin: Skin is warm and dry  Psychiatric: normal mood and affect  GU: normal non tender penis, no meatal stenosis, bilateral testicles non tender with no masses,  Foley with clear yellow urine  DRE: normal tone, prostate 30g, smooth with no nodules  Results:   No results found for this or any previous visit (from the past 24 hour(s)).     Electronically signed by Jacklynn Barnacle, MD at 11/13/2021  3:42 PM EDT

## 2021-11-13 NOTE — Care Coordination-Inpatient (Signed)
Incoming tc from [place] he states he had some labs drawn 10/11 after seeing Dr. Daleen Squibb, he wanted to know result.  Advised PCP is not in the office until Thursday of this week, advised PCP will review on return.  Patient asked specifically re: glucose, BUN, creatinine those number were reviewed and ranges explained.    Patient reports he has been checking his BS he reports it is the low 200's after a meal and fasting between 170-190.  He reports he is not eating carbs and watching his diet.  Discussed glimepiride 2 mg patient reports he has only been taking it 1x/day with a meal.  Advised order reads for 2x/day with meals he will start this today.  Talked about fluid intake to prevent dehydration patient states he is trying to drink a gallon of water a day.  He reports in re: to his skin turgor he does not appear to be dehydrated.  Patient is scheduled to see urology today.

## 2021-11-20 NOTE — Progress Notes (Signed)
Formatting of this note might be different from the original.  Dominic Stephens here with wife for foley cath change. 18 fr coude changed without difficulty. Attached to leg and given xtra leg bag; states just got over a UTI and still having burning. They are going on vacation this weekend and will send off repeat UC after meds. He is also set up for UDS and aware to get UC from cath 11/10 in office.  Electronically signed by Claud Kelp, RN at 11/20/2021 10:13 AM EDT

## 2021-11-22 ENCOUNTER — Inpatient Hospital Stay: Admit: 2021-11-22 | Primary: Internal Medicine

## 2021-11-22 ENCOUNTER — Encounter

## 2021-11-22 DIAGNOSIS — R339 Retention of urine, unspecified: Secondary | ICD-10-CM

## 2021-11-22 LAB — BASIC METABOLIC PANEL
Anion Gap: 11 mmol/L (ref 5–15)
BUN: 34 MG/DL — ABNORMAL HIGH (ref 8–23)
Bun/Cre Ratio: 29 — ABNORMAL HIGH (ref 12–20)
CO2: 24 mmol/L (ref 22–29)
Calcium: 9 MG/DL (ref 8.8–10.2)
Chloride: 105 mmol/L (ref 98–107)
Creatinine: 1.19 MG/DL — ABNORMAL HIGH (ref 0.67–1.17)
Est, Glom Filt Rate: >60 mL/min/{1.73_m2} (ref >60)
Glucose: 218 mg/dL — ABNORMAL HIGH (ref 74–109)
Potassium: 4 mmol/L (ref 3.5–5.1)
Sodium: 140 mmol/L (ref 136–145)

## 2021-11-26 NOTE — Telephone Encounter (Signed)
Formatting of this note might be different from the original.  Spoke to patient to let him know there was no bacteria in his most recent urine culture, asked if we got labs from [facility] on his kidneys I told him no and I would fax over for dr. Mathews Robinsons to look at. Had no further questions, just worried about his kidney function.   Electronically signed by Grayland Jack at 11/26/2021  3:42 PM EDT

## 2021-11-28 ENCOUNTER — Encounter

## 2021-11-28 MED ORDER — TAMSULOSIN HCL 0.4 MG PO CAPS
0.4 MG | ORAL_CAPSULE | Freq: Every day | ORAL | 1 refills | Status: AC
Start: 2021-11-28 — End: 2022-03-04

## 2021-11-28 MED ORDER — ATENOLOL 50 MG PO TABS
50 MG | ORAL_TABLET | Freq: Two times a day (BID) | ORAL | 3 refills | Status: DC
Start: 2021-11-28 — End: 2022-03-10

## 2021-11-28 MED ORDER — GLIMEPIRIDE 2 MG PO TABS
2 MG | ORAL_TABLET | Freq: Two times a day (BID) | ORAL | 1 refills | Status: DC
Start: 2021-11-28 — End: 2022-08-12

## 2021-11-28 NOTE — Telephone Encounter (Signed)
Incoming tc from patient requesting refills for atenolol, glimepiride and tamsulosin.    Patient uses Paediatric nurse on [address] in [place].

## 2021-11-28 NOTE — Care Coordination-Inpatient (Signed)
Spoke to [place] this morning re: refills -see telephone note to PCP.  TC from patient this afternoon he was looking for result of 10/26 labs ordered by Dr. Mathews Robinsons in urology -advise labs were reviewed by PCP and noted creatinine is better, glucose is still high.    Patient reports he has been checking his BS and they have been under 200, he reports his fasting BS for this AM was 149 he is taking glimepiride 2 mg 2x/day as directed.     Patient reports he still has the foley catheter in he will be going on vacation with it in, he states it is draining clear yellow urine, patient reports he has a follow up with urology 11/10.    Patient does have this cc office contact information for any questions or concerns. No Care Coordination needs at this time.  To graduate from Care Coordination.

## 2021-12-06 NOTE — Telephone Encounter (Signed)
Formatting of this note might be different from the original.   Please call patient let him know most recent creatinine is 1.19, this is much improved from before and just outside the normal range  Electronically signed by Jacklynn Barnacle, MD at 12/06/2021  7:58 AM EST

## 2021-12-06 NOTE — Telephone Encounter (Signed)
Formatting of this note might be different from the original.  Message left for pt requesting a CB. Dennard Schaumann, LPN    Electronically signed by Dennard Schaumann, LPN at 60/63/0160  1:09 PM EST

## 2021-12-10 NOTE — Telephone Encounter (Signed)
Formatting of this note might be different from the original.  Dominic Stephens and let him know his results  Electronically signed by Silvestre Mesi, LPN at 30/16/0109  3:23 PM EST

## 2021-12-10 NOTE — Telephone Encounter (Signed)
Formatting of this note might be different from the original.  ----- Message from Jacklynn Barnacle, MD sent at 12/10/2021 11:07 AM EST -----  Regarding: RE: UDS abx?  He can go without abx, thanks  ----- Message -----  From: Salvadore Farber, RN  Sent: 12/10/2021   9:38 AM EST  To: Clayborn Heron Ridyard, MD  Subject: UDS abx?                                         Hi Dr Ridyard,    This pt is on for UDS tomorrow, has foley. Do you recommend any abx prior? Ucx 10/24 negative.    Thanks,  Abby      Electronically signed by Salvadore Farber, RN at 12/10/2021 12:29 PM EST

## 2021-12-24 NOTE — Progress Notes (Signed)
Formatting of this note might be different from the original.  Dominic Stephens here with wife for catheter change. He missed appointment for UDS and Dr Ridyard. Pt states he has too much going on. Pt and wife wanted a repeat urine sample and lab work BMP . While removing catheter 18 fr coude and starting to replace he did not want catheter replaced; spoke to him and wife regarding why it should be replaced; aware he had 3054mls and BMP was off and did not show to appts for plan it was not  a good idea and he may not void; he still refused cat and sai he was aware if he couldn't void he would just go to [facility]. Wants a repeat UC and BMP . Will talk to Dr Ridyard to see if I can order labs at [facility] and pt needs to be rescheduled for appointments; if unable to void and does get cath will need to get UDS as was planned and f/u with Ridyrad.Claud Kelp, RN    Electronically signed by Claud Kelp, RN at 12/24/2021  9:07 AM EST

## 2021-12-24 NOTE — Telephone Encounter (Signed)
Formatting of this note might be different from the original.  ----- Message from Jacklynn Barnacle, MD sent at 12/24/2021  3:04 PM EST -----  I do not think he needs UC unless having symptoms, if having symptoms can be ordered.  thanks  ----- Message -----  From: Claud Kelp, RN  Sent: 12/24/2021   2:55 PM EST  To: Jacklynn Barnacle, MD    Jackalyn Lombard. So sorry. I thought it copied his name. They wanted a repeat UC also but just let me know. Darlene  ----- Message -----  From: Jacklynn Barnacle, MD  Sent: 12/24/2021   2:46 PM EST  To: Claud Kelp, RN    Darlene,    I think I know what patient you are talking about but need a last name. I agree with plan, should get BMP, likely at least come to our office for bladder scan later this week. I do not think he necessarily needs urine culture unless UTI symptoms. Please send me full patient name so I can confirm.  Doug  ----- Message -----  From: Claud Kelp, RN  Sent: 12/24/2021   8:51 AM EST  To: Jacklynn Barnacle, MD    Iona Beard here with wife for catheter change. He missed missed appointment for UDS and Dr Ridyard. Pt states he has too much going on. Pt and wife wanted a repeat urine sample and lab work BMP . While removing catheter and starting to replace he did not want catheter replaced; spoke to him and wife regarding why it should be replaced; aware he had 3064mls and BMP was off and did not show to appts for plan it was not  a good idea and he may not void; he still refused cat and sai he was aware if he couldn't void he would just go to [facility]. Wants a repeat UC and BMP . Will talk to Dr Ridyard to see if I can order labs at [facility] and pt needs to be rescheduled for appointments; if unable to void and does get cath will need to get UDS as was planned and f/u with Ridyrad.Claud Kelp, RN      Electronically signed by Claud Kelp, RN at 12/24/2021  4:04 PM EST

## 2021-12-24 NOTE — Progress Notes (Signed)
Formatting of this note might be different from the original.  Wai here with wife for catheter change. He missed missed appointment for UDS and Dr Ridyard. Pt states he has too much going on. Pt and wife wanted a repeat urine sample and lab work BMP . While removing catheter and starting to replace he did not want catheter replaced; spoke to him and wife regarding why it should be replaced; aware he had 3059mls and BMP was off and did not show to appts for plan it was not  a good idea and he may not void; he still refused cat and sai he was aware if he couldn't void he would just go to [facility]. Wants a repeat UC and BMP . Will talk to Dr Ridyard to see if I can order labs at [facility] and pt needs to be rescheduled for appointments; if unable to void and does get cath will need to get UDS as was planned and f/u with Ridyrad.Claud Kelp, RN    Electronically signed by Claud Kelp, RN at 12/24/2021  8:50 AM EST

## 2021-12-24 NOTE — Telephone Encounter (Signed)
Formatting of this note might be different from the original.  I spoke to [place]. I gave him Dr. Michael Litter message. Asked where he wanted the BMP labs drawn. He said [facility]. In chart states [facility] so will send to both. He is aware that we do not need to get UC done now. He said he hasnt been able to void and feels he has to push to get urine out; I told him he will need to have catheter placed; he wanted to know if he could wait until am and I warned him that last time he had 3087mls and labs were off so he should have the catheter placed today and not wait; he told me his bladder was working prior and doesn't undweerstand why he needs to have cath and unable to void. Let him know he needs to f/u with Dr Ridyard. He will see about going to get cath placed so I told him to go to [facility] then and lab slip will be there so they can get labs, urine from new cath and then he needs to meet with Dr Rirdyard.Claud Kelp, RN    Electronically signed by Claud Kelp, RN at 12/24/2021  4:16 PM EST

## 2021-12-25 ENCOUNTER — Inpatient Hospital Stay
Admit: 2021-12-25 | Discharge: 2021-12-25 | Disposition: A | Discharge status: Home / Self Care | Payer: MEDICARE | Attending: Emergency Medicine

## 2021-12-25 DIAGNOSIS — R339 Retention of urine, unspecified: Secondary | ICD-10-CM

## 2021-12-25 LAB — URINE MICROSCOPIC WITH REFLEX TO CULTURE
BACTERIA, URINE: NOT DETECTED /hpf
Epithelial Cells UA: NOT DETECTED /hpf (ref <5)
Hyaline Casts, UA: NOT DETECTED /lpf (ref <8)
RBC, UA: 5 /hpf — ABNORMAL HIGH (ref <4)
WBC, UA: >100 /hpf — ABNORMAL HIGH (ref <5)

## 2021-12-25 LAB — BASIC METABOLIC PANEL
Anion Gap: 11 mmol/L (ref 5–15)
BUN: 27 MG/DL — ABNORMAL HIGH (ref 8–23)
Bun/Cre Ratio: 21 — ABNORMAL HIGH (ref 12–20)
CO2: 26 mmol/L (ref 22–29)
Calcium: 9.7 MG/DL (ref 8.8–10.2)
Chloride: 102 mmol/L (ref 98–107)
Creatinine: 1.3 MG/DL — ABNORMAL HIGH (ref 0.67–1.17)
Est, Glom Filt Rate: >60 mL/min/{1.73_m2} (ref >60)
Glucose: 208 mg/dL — ABNORMAL HIGH (ref 74–109)
Potassium: 4.3 mmol/L (ref 3.5–5.1)
Sodium: 139 mmol/L (ref 136–145)

## 2021-12-25 LAB — URINALYSIS WITH REFLEX TO CULTURE
Bilirubin Urine: NEGATIVE
Glucose, Ur: NEGATIVE mg/dL
Ketones, Urine: NEGATIVE mg/dL
Nitrite, Urine: NEGATIVE
Protein, Urine: 100 mg/dL — AB
Specific Gravity, Urine: 1.007 (ref 1.001–1.035)
Urobilinogen, Urine: 0.2 EU/dL (ref 0.2–1.0)
pH, Urine: 7 (ref 5–8)

## 2021-12-25 MED ORDER — LIDOCAINE HCL URETHRAL/MUCOSAL 2 % EX PRSY
2 % | CUTANEOUS | Status: AC
Start: 2021-12-25 — End: 2021-12-25
  Administered 2021-12-25: 15:00:00 via TOPICAL

## 2021-12-25 MED FILL — GLYDO 2 % EX PRSY: 2 % | CUTANEOUS | Qty: 11

## 2021-12-25 NOTE — ED Notes (Signed)
Discharge instructions reviewed with the pt by this RN. Pt is aware of prescriptions to pick up at their pharmacy and of follow up appointments. Pt signs paperwork and ambulates out of the ED with a steady gait and in no apparent distress.        Laretta Bolster, RN  12/25/21 1326

## 2021-12-25 NOTE — ED Notes (Signed)
Foley transferred to Leg Bag     Laretta Bolster, RN  12/25/21 1325

## 2021-12-25 NOTE — ED Triage Notes (Signed)
Pt states had foley removed yesterday. Since then had trouble voiding with associated pain. Pt states stones in bladder and he believes that is why he cannot void without pain

## 2021-12-25 NOTE — ED Notes (Signed)
Bladder scan shows 999+     Graciela Husbands  12/25/21 9753

## 2021-12-25 NOTE — ED Provider Notes (Signed)
66 year old male with history bladder stones presents emergency department after not being able to urinate for approximately 10 hours.  The patient and wife state that he had Foley catheter removed at the urologist's office yesterday.  He had no urine overnight or this morning.  Feels as if his lower abdomen is distended.  States that he planning to get outpatient renal function test today.  He has no fever, back pain, hematuria.    The history is provided by the patient and the spouse.           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    Past Surgical History:   Procedure Laterality Date    ORTHOPEDIC SURGERY      Lumbar discecomy 2001       Social History     Socioeconomic History    Marital status: Single     Spouse name: Not on file    Number of children: Not on file    Years of education: Not on file    Highest education level: Not on file   Occupational History    Not on file   Tobacco Use    Smoking status: Never    Smokeless tobacco: Never   Substance and Sexual Activity    Alcohol use: Not on file    Drug use: Not on file    Sexual activity: Not on file   Other Topics Concern    Not on file   Social History Narrative    Not on file     Social Determinants of Health     Financial Resource Strain: Not on file   Food Insecurity: Not on file   Transportation Needs: Not on file   Physical Activity: Not on file   Stress: Not on file   Social  Connections: Not on file   Intimate Partner Violence: Not on file   Housing Stability: Not on file       Family History:  Family History   Problem Relation Age of Onset    Hypertension Mother     Diabetes Father     Diabetes Mother        Medications:  Patient medication list reviewed  This patient does not have an active medication from one of the medication groupers.    Allergies:    Methocarbamol, Sulfa antibiotics, Sulfamethoxazole-trimethoprim, and Penicillins    Review of Systems   Genitourinary:  Positive for difficulty urinating.   All other systems reviewed and are negative.        Vital Signs for this visit:  Vitals:    12/25/21 0933 12/25/21 1310   BP: (!) 164/79    Pulse: (!) 45 62   Resp: 18    Temp: 98.2 F (36.8 C)    TempSrc: Tympanic  SpO2: 97%    Weight: 127.5 kg (281 lb)    Height: 1.956 m (6\' 5" )          Physical Exam  Vitals and nursing note reviewed.   Constitutional:       General: He is not in acute distress.     Appearance: Normal appearance. He is normal weight.   HENT:      Head: Normocephalic and atraumatic.   Cardiovascular:      Rate and Rhythm: Normal rate.   Pulmonary:      Effort: Pulmonary effort is normal. No respiratory distress.   Abdominal:      General: Abdomen is flat. There is no distension.      Palpations: Abdomen is soft. There is no mass.      Tenderness: There is no abdominal tenderness. There is no guarding.      Hernia: No hernia is present.   Neurological:      General: No focal deficit present.      Mental Status: He is alert and oriented to person, place, and time. Mental status is at baseline.         Procedures      Medical Decision Making  66 year old male with history bladder stones presents emergency department after not being able to urinate for approximately 10 hours.  The patient and wife state that he had Foley catheter removed at the urologist's office yesterday.  He had no urine overnight or this morning.  Feels as if his lower abdomen is distended.   States that he planning to get outpatient renal function test today.  He has no fever, back pain, hematuria.    Vitals stable.  Patient had bladder scan which revealed more than 1 L of urine.  He had significant relief after placement of Foley catheter.  On evaluation, patient's abdomen is soft, nondistended, no CVA tenderness.  Urine output through the Foley has some sediment, but no hematuria.    Was likely blocked from a stone.  His urine sample today does not reveal evidence of infection.  The patient and his wife are incredibly concerned that he could have ongoing bladder stones and are requesting CT imaging.  However, I feel that this is not acutely indicated as the patient does not warrant emergent intervention without fever, CVA tenderness, or other evidence of obstruction after Foley placement.  His renal function is baseline.  Electrolytes within normal.    I discussed it would be most appropriate for urology follow-up, cystoscopy could help reveal stones and treat at the same time.  They are in agreement with close follow-up with Urology, they will return for any new or worsening symptoms.    Amount and/or Complexity of Data Reviewed  Labs: ordered.    Risk  Prescription drug management.              Lab orders and findings that I have personally reviewed and interpreted during this visit.  Only abnormal values will be noted:  Abnormal Labs Reviewed   CULTURE, URINE - Abnormal; Notable for the following components:       Result Value    Culture >100,000 CFU/mL Klebsiella oxytoca (*)     All other components within normal limits   URINALYSIS WITH REFLEX TO CULTURE - Abnormal; Notable for the following components:    Appearance TURBID (*)     Protein, Urine 100 (*)     Blood, Urine LARGE (*)     Leukocyte Esterase, Urine LARGE (*)  All other components within normal limits   URINE MICROSCOPIC WITH REFLEX TO CULTURE - Abnormal; Notable for the following components:    WBC, UA >100 (*)     RBC, UA 5 (*)      All other components within normal limits   BASIC METABOLIC PANEL - Abnormal; Notable for the following components:    Glucose 208 (*)     BUN 27 (*)     Creatinine 1.30 (*)     Bun/Cre Ratio 21 (*)     All other components within normal limits       Recent radiology studies including this visit.  I have personally reviewed these studies and my personal interpretation, if available, is documented in the ED Course:  No orders to display       Medications given in the ED:  Medications   lidocaine (XYLOCAINE) 2 % uro-jet ( Topical Given 12/25/21 1002)       Diagnosis:  1. Urinary retention        ED Course:           Please note that portions of this document were created using the M*Modal Fluency Direct dictation system.  Any inconsistencies or typographical errors may be the result of mis-transcription that persist in spite of proof-reading and should be addressed with the document creator.        [place], Deidre Ala, DO  12/28/21 1552

## 2021-12-25 NOTE — Discharge Instructions (Addendum)
Thank you for choosing [facility]!    Unfortunately, you still have urinary retention.  Please take care of your Foley catheter as described in instructions below.  Please follow-up with your urologist for further evaluation and for voiding trial for removal.    Follow-up with your family doctor for results on renal function obtained today.    Return for any sudden new fever, blood in the Foley bag, or new/worsening symptoms such as back pain, nausea, vomiting etc..

## 2021-12-26 MED ORDER — CIPROFLOXACIN HCL 500 MG PO TABS
500 MG | ORAL_TABLET | Freq: Two times a day (BID) | ORAL | 0 refills | Status: AC
Start: 2021-12-26 — End: 2022-01-05

## 2021-12-27 LAB — CULTURE, URINE: Culture: >100000 — AB

## 2022-01-14 NOTE — Telephone Encounter (Signed)
Formatting of this note might be different from the original.  Lvm for patient to call the office to follow up with him on his visit coming up with dr.ridyard  Electronically signed by Grayland Jack at 01/14/2022  1:39 PM EST

## 2022-02-11 NOTE — Telephone Encounter (Signed)
Formatting of this note might be different from the original.  Please let patient know his urine came back with mixed growth, no sign of true infection.     He will need to repeat a culture 10 days prior to his UDS as discussed in his office visit. I wrote down instructions for him on his AVS on how to obtain sample at the lab. [facility].   Electronically signed by Cherre Robins, APRN at 02/11/2022  9:00 AM EST

## 2022-02-11 NOTE — Telephone Encounter (Signed)
Formatting of this note might be different from the original.  Called Tony and left a message to call the office back  Electronically signed by Silvestre Mesi, LPN at 08/67/6195 09:32 AM EST

## 2022-02-11 NOTE — Telephone Encounter (Signed)
Formatting of this note might be different from the original.  Called Kionte and let him know his results, I did let him know that he needs to do another UC 10 days prior to the UDS. He wanted his UC sent to the [facility], this was done.   Electronically signed by Silvestre Mesi, LPN at 98/92/1194  1:74 PM EST

## 2022-02-12 NOTE — Assessment & Plan Note (Signed)
Associated Problem(s): Urinary retention  Formatting of this note might be different from the original.  I will have nursing change his catheter today and obtain urine from new catheter to send for culture. He will need to reschedule his UDS appointment and obtain urine sample from catheter 10 days prior to UDS. Instructions provided. He is also requesting a BMP to check kidney function as he is concerned. Will order. Last creatinin 11/22/21 1.19 and improving with cath in. Continue Flomax 0.4mg  daily. All questions answered.  Patient verbalizes understanding and is in agreement with this plan.   Electronically signed by Cherre Robins, APRN at 02/12/2022 10:49 AM EST

## 2022-02-13 ENCOUNTER — Encounter

## 2022-02-22 ENCOUNTER — Inpatient Hospital Stay: Admit: 2022-02-22 | Payer: MEDICARE | Primary: Internal Medicine

## 2022-02-22 DIAGNOSIS — R339 Retention of urine, unspecified: Secondary | ICD-10-CM

## 2022-02-23 LAB — CULTURE, URINE

## 2022-02-25 ENCOUNTER — Encounter

## 2022-02-27 NOTE — Telephone Encounter (Signed)
Formatting of this note might be different from the original.  ----- Message from Cherre Robins, APRN sent at 02/27/2022 10:41 AM EST -----  Regarding: RE: abx for UDS  Will give him Keflex to start day before. Ill put the order in. Thank you!  ----- Message -----  From: Salvadore Farber, RN  Sent: 02/27/2022  10:30 AM EST  To: Cherre Robins, APRN  Subject: RE: abx for UDS                                  Done at [facility] in care everywhere  ----- Message -----  From: Cherre Robins, APRN  Sent: 02/27/2022  10:27 AM EST  To: Salvadore Farber, RN  Subject: RE: abx for UDS                                  Where do you see the urine culture? I only see 1/13's culture  ----- Message -----  From: Salvadore Farber, RN  Sent: 02/27/2022   9:36 AM EST  To: Cherre Robins, APRN  Subject: abx for UDS                                      UDS next week, has foley  Ucx 1/26 mixed flora, do you recommend abx?       Electronically signed by Salvadore Farber, RN at 02/27/2022 11:31 AM EST

## 2022-02-27 NOTE — Telephone Encounter (Signed)
Formatting of this note might be different from the original.  Spoke with pt and informed him of Ucx result and abx.  Electronically signed by Salvadore Farber, RN at 02/27/2022 11:32 AM EST

## 2022-02-27 NOTE — Telephone Encounter (Signed)
Formatting of this note might be different from the original.  Spoke with pt and informed him of Ucx result and abx for UDS. He verbalized understanding.  He also asked for BMP order to be sent to [facility], order faxed.  Electronically signed by Salvadore Farber, RN at 02/27/2022 11:35 AM EST

## 2022-03-04 ENCOUNTER — Encounter

## 2022-03-04 MED ORDER — TAMSULOSIN HCL 0.4 MG PO CAPS
0.4 MG | ORAL_CAPSULE | Freq: Every day | ORAL | 0 refills | Status: DC
Start: 2022-03-04 — End: 2022-05-08

## 2022-03-05 ENCOUNTER — Inpatient Hospital Stay: Admit: 2022-03-05 | Payer: MEDICARE | Primary: Internal Medicine

## 2022-03-05 DIAGNOSIS — R339 Retention of urine, unspecified: Secondary | ICD-10-CM

## 2022-03-05 LAB — BASIC METABOLIC PANEL
Anion Gap: 13 mmol/L (ref 5–15)
BUN: 33 MG/DL — ABNORMAL HIGH (ref 8–23)
Bun/Cre Ratio: 19 (ref 12–20)
CO2: 24 mmol/L (ref 22–29)
Calcium: 9.4 MG/DL (ref 8.8–10.2)
Chloride: 99 mmol/L (ref 98–107)
Creatinine: 1.77 MG/DL — ABNORMAL HIGH (ref 0.67–1.17)
Est, Glom Filt Rate: 42 mL/min/{1.73_m2} — ABNORMAL LOW (ref >60)
Glucose: 368 mg/dL — ABNORMAL HIGH (ref 74–109)
Potassium: 5 mmol/L (ref 3.5–5.1)
Sodium: 136 mmol/L (ref 136–145)

## 2022-03-06 ENCOUNTER — Encounter

## 2022-03-09 ENCOUNTER — Encounter

## 2022-03-10 MED ORDER — ATENOLOL 50 MG PO TABS
50 MG | ORAL_TABLET | Freq: Two times a day (BID) | ORAL | 0 refills | Status: AC
Start: 2022-03-10 — End: 2022-04-02

## 2022-03-14 ENCOUNTER — Inpatient Hospital Stay: Admit: 2022-03-14 | Payer: MEDICARE | Primary: Internal Medicine

## 2022-03-14 DIAGNOSIS — R7989 Other specified abnormal findings of blood chemistry: Secondary | ICD-10-CM

## 2022-03-29 NOTE — Telephone Encounter (Signed)
LVM to reschedule 3/13 appt - [facility]

## 2022-04-01 ENCOUNTER — Inpatient Hospital Stay: Admit: 2022-04-01 | Payer: MEDICARE | Primary: Internal Medicine

## 2022-04-01 ENCOUNTER — Ambulatory Visit: Admit: 2022-04-01 | Discharge: 2022-04-01 | Payer: MEDICARE | Attending: Internal Medicine | Primary: Internal Medicine

## 2022-04-01 DIAGNOSIS — R8290 Unspecified abnormal findings in urine: Secondary | ICD-10-CM

## 2022-04-01 DIAGNOSIS — L089 Local infection of the skin and subcutaneous tissue, unspecified: Secondary | ICD-10-CM

## 2022-04-01 DIAGNOSIS — I10 Essential (primary) hypertension: Secondary | ICD-10-CM

## 2022-04-01 LAB — URINALYSIS WITH REFLEX TO CULTURE
Bilirubin Urine: NEGATIVE
Glucose, Ur: NEGATIVE mg/dL
Ketones, Urine: NEGATIVE mg/dL
Nitrite, Urine: POSITIVE — AB
Protein, Urine: 30 mg/dL — AB
Specific Gravity, Urine: 1.017 (ref 1.001–1.035)
Urobilinogen, Urine: 0.2 EU/dL (ref 0.2–1.0)
pH, Urine: 6 (ref 5–8)

## 2022-04-01 LAB — URINE MICROSCOPIC WITH REFLEX TO CULTURE
Epithelial Cells UA: NONE SEEN /hpf (ref <5)
Hyaline Casts, UA: NONE SEEN /lpf

## 2022-04-01 MED ORDER — AMOXICILLIN-POT CLAVULANATE 875-125 MG PO TABS
875-125 | ORAL_TABLET | Freq: Two times a day (BID) | ORAL | 0 refills | Status: AC
Start: 2022-04-01 — End: 2022-04-11

## 2022-04-01 NOTE — Progress Notes (Signed)
Subjective:   Dominic Stephens is a 67 y.o. male presenting for the following issues:    The patient presents with an infection in the right second finger. The area is sensitive yet numb, and this has been ongoing for two weeks. The patient is unsure of what caused the initial cut that led to the infection.    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    AKI (acute kidney injury) ([place])    Unspecified severe protein-calorie malnutrition ([place])    Type 2 diabetes mellitus with hyperglycemia, without long-term current use of insulin (HCC)    Type 2 diabetes mellitus    Primary hypertension       Current Outpatient Medications   Medication Sig    atenolol (TENORMIN) 50 MG tablet Take 1 tablet by mouth twice daily    tamsulosin (FLOMAX) 0.4 MG capsule Take 1 capsule by mouth once daily    glimepiride (AMARYL) 2 MG tablet Take 1 tablet by mouth 2 times daily (with meals)    blood glucose test strips (ONETOUCH ULTRA) strip Test sugars once daily E11.65    Blood Glucose Monitoring Suppl (ONE TOUCH ULTRA 2) w/Device KIT Test sugars once daily E11.65    ONE TOUCH ULTRASOFT LANCETS MISC Test sugars once daily E11.65    acetaminophen (TYLENOL) 325 MG tablet Take 2 tablets by mouth every 6 hours as needed for Pain    metFORMIN (GLUCOPHAGE) 500 MG tablet Take 1 tablet by mouth in the morning and at bedtime (Patient not taking: Reported on 04/01/2022)    ondansetron (ZOFRAN-ODT) 4 MG disintegrating tablet Take 1 tablet by mouth every 8 hours as needed for Nausea or Vomiting (Patient not taking: Reported on 04/01/2022)    finasteride (PROSCAR) 5 MG tablet Take 1 tablet by mouth daily (Patient not  taking: Reported on 04/01/2022)    amLODIPine (NORVASC) 10 MG tablet TAKE 1 TABLET BY MOUTH ONCE DAILY (Patient not taking: Reported on 04/01/2022)     No current facility-administered medications for this visit.       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 02/03/2018    Bladder stones 12/22/2017    Renal stones 12/22/2017    Gross hematuria 12/22/2017    Chronic prostatitis 09/11/2011    Diagnosis unknown 09/11/2011    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         CBC:   Lab Results   Component Value Date/Time    WBC 8.9 11/07/2021 03:41 PM    RBC 4.00 11/07/2021 03:41 PM    HGB 11.7 11/07/2021 03:41 PM    HCT 36.4 11/07/2021 03:41 PM    MCV 91.0  11/07/2021 03:41 PM    MCH 29.3 11/07/2021 03:41 PM    MCHC 32.1 11/07/2021 03:41 PM    RDW 13.5 11/07/2021 03:41 PM    PLT 315 11/07/2021 03:41 PM    MPV 8.4 11/07/2021 03:41 PM     CBC with Differential:    Lab Results   Component Value Date/Time    WBC 8.9 11/07/2021 03:41 PM    RBC 4.00 11/07/2021 03:41 PM    HGB 11.7 11/07/2021 03:41 PM    HCT 36.4 11/07/2021 03:41 PM    PLT 315 11/07/2021 03:41 PM    MCV 91.0 11/07/2021 03:41 PM    MCH 29.3 11/07/2021 03:41 PM    MCHC 32.1 11/07/2021 03:41 PM    RDW 13.5 11/07/2021 03:41 PM    NRBC 0.0 11/07/2021 03:41 PM    NRBC 0.00 11/07/2021 03:41 PM    LYMPHOPCT 13 10/31/2021 01:38 PM    MONOPCT 10 10/31/2021 01:38 PM    BASOPCT 0 10/31/2021 01:38 PM    MONOSABS 1.3 10/31/2021 01:38 PM    LYMPHSABS 1.8 10/31/2021 01:38 PM    EOSABS 0.2 10/31/2021 01:38 PM    BASOSABS 0.1 10/31/2021 01:38 PM    DIFFTYPE AUTOMATED  10/31/2021 01:38 PM     CMP:    Lab Results   Component Value Date/Time    NA 136 03/05/2022 01:43 PM    K 5.0 03/05/2022 01:43 PM    CL 99 03/05/2022 01:43 PM    CO2 24 03/05/2022 01:43 PM    BUN 33 03/05/2022 01:43 PM    CREATININE 1.77 03/05/2022 01:43 PM    GFRAA 74 01/21/2019 11:14 AM    AGRATIO 1.0 11/07/2021 03:41 PM    LABGLOM 42 03/05/2022 01:43 PM    GLUCOSE 368 03/05/2022 01:43 PM    PROT 8.1 11/07/2021 03:41 PM    LABALBU 4.0 11/07/2021 03:41 PM    CALCIUM 9.4 03/05/2022 01:43 PM    BILITOT 0.27 11/07/2021 03:41 PM    ALKPHOS 104 11/07/2021 03:41 PM    ALKPHOS 92 01/21/2019 11:14 AM    AST 27 11/07/2021 03:41 PM    ALT 24 11/07/2021 03:41 PM     BMP:    Lab Results   Component Value Date/Time    NA 136 03/05/2022 01:43 PM    K 5.0 03/05/2022 01:43 PM    CL 99 03/05/2022 01:43 PM    CO2 24 03/05/2022 01:43 PM    BUN 33 03/05/2022 01:43 PM    LABALBU 4.0 11/07/2021 03:41 PM    CREATININE 1.77 03/05/2022 01:43 PM    CALCIUM 9.4 03/05/2022 01:43 PM    GFRAA 74 01/21/2019 11:14 AM    LABGLOM 42 03/05/2022 01:43 PM    GLUCOSE 368 03/05/2022 01:43 PM        No results found for: "CHOL"  No results found for: "TRIG"  No results found for: "HDL"  No results found for: "LDLCHOLESTEROL", "LDLCALC"  No results found for: "VLDL"  No results found for: "CHOLHDLRATIO"      Objective:     BP (!) 138/58 (Site: Left Upper Arm, Position: Sitting, Cuff Size: Large Adult)   Pulse 68   Wt 130.2 kg (287 lb)   BMI 34.03 kg/m     General Appearance: alert and oriented to person, place and time, well-developed and well-nourished, in no acute distress  Pulmonary/Chest: clear to auscultation bilaterally- no wheezes, rales or rhonchi, normal air movement, no respiratory distress  Cardiovascular: normal rate, normal  S1 and S2, no gallops, intact distal pulses, and no carotid bruits    R 2nd finger: red, swollen, no drainage or pus    Assessment/Plan:     Dominic Stephens was seen today for other.    Diagnoses and all orders for this  visit:    Primary hypertension    Type 2 diabetes mellitus with other specified complication, unspecified whether long term insulin use ([place])  -     Referral to [facility]    Finger infection  -     amoxicillin-clavulanate (AUGMENTIN) 875-125 MG per tablet; Take 1 tablet by mouth 2 times daily for 10 days    Cloudy urine  -     Urinalysis with Reflex Micro to Culture; Future    1. Cellulitis: The patient is diagnosed with cellulitis. They are prescribed Augmentin to be taken for 10 days. A follow-up visit is scheduled in one week to assess the patient's response to the treatment. If there is no improvement, the patient will be referred to the Orthopedics department for potential Incision and Drainage (I&D).    2. Diabetes Mellitus: The patient is advised to continue their Glimepiride regimen for diabetes management.    3. Hypertension: The patient should continue their Atenolol regimen for hypertension management.    4. Cloudy Urine: The patient has reported experiencing cloudy urine. A Urinalysis (UA) is recommended to rule out (R/O) the possibility of a urinary tract infection.    Medical decision making    Moderate number and complexity of problem:    2 or more stable chronic illnesses    Moderate amount and/or complexity to be reviewed, analyzed, or ordered:    A combination of at least 3 tests were reviewed or ordered    Moderate risk of morbidity from additional diagnostic testing or treatment:    Prescription drug management    The patient's chart, labs, diagnostic tests, and medications were reviewed. This was followed by a face to face encounter with the patient that involved counseling, lab and diagnostic test review, coordination of care, detailing the pros and cons of the treatment plan, and answering all of the patient's questions. The patient's record was updated to accurately reflect the medication list and active problem list. A note documenting the encounter was written.     Total time spent for  this encounter includes the following:    Preparing to see the patient (review of diagnostic tests, labs, and medications)  Obtaining/reviewing separately obtained history  Performing a medically appropriate examination and/or evaluation  Counseling/education of the patient/family  Ordering medications, tests, or procedures  Referring and communicating with other health care professionals  Documenting clinical information in the electronic health record  Independently interpreting results and communicating results to the patient/family/caregiver  Care coordination     Total time spent: 20 minutes    No follow-up provider specified.  Future Appointments   Date Time Provider [place]   05/08/2022  3:00 PM Reape, Kirstie Peri, MD NPSSN [facility]       Kern Reap, MD  04/01/2022

## 2022-04-01 NOTE — Telephone Encounter (Signed)
Ua pos for uti. Already on abx for finger which shoulder cover.[name]

## 2022-04-01 NOTE — Telephone Encounter (Signed)
INCOMING CALL NOTE     Caller Name:  Romy    Relationship of Caller to Patient:   self    Return number:  [phone]      Reason for Call:  Infection in right hand index finger cut red, swollen, sore happen a couple weeks ago.     Is the reason for call a hot word listed on the triage list?   YES     Call Notes:  RIGHT HAND INDEX FINGER CUT SWOLLEN    Staff Member Action:  transfer to clinical    -------------    Is a visit being scheduled within the next 48 hours?  No  If yes, please ensure that the "Travel Screening" questionnaire is completed on the phone with the patient.

## 2022-04-01 NOTE — Telephone Encounter (Signed)
Finger injury 2-1/2 weeks ago - pt reports that he first noticed a small cut on one side of his right index finger; seemed to go away; finger felt numb; then noticed a cut on the other side, same finger; hasn't gotten better; swollen, red.  Pt has no idea how he cut himself.  No drainage.  Pt has tried soaking in salt water.  Finger continues to be numb at the tip.  Scheduled for OV today.          Reason for Disposition   Patient wants to be seen    Answer Assessment - Initial Assessment Questions  1. MECHANISM: "How did the injury happen?"       *No Answer*  2. ONSET: "When did the injury happen?" (Minutes or hours ago)       2-1/2 weeks ago  3. LOCATION: "What part of the finger is injured?" "Is the nail damaged?"       R index finger - "one cut on either side"  4. APPEARANCE of the INJURY: "What does the injury look like?"       "Angry"  5. SEVERITY: "Can you use the hand normally?"  "Can you bend your fingers into a ball and then fully open them?"      Yes, but fingertip is numb  6. SIZE: For cuts, bruises, or swelling, ask: "How large is it?" (e.g., inches or centimeters;  entire finger)       Pt reports "small"  7. PAIN: "Is there pain?" If Yes, ask: "How bad is the pain?"    (e.g., Scale 1-10; or mild, moderate, severe)   - NONE (0): no pain.   - MILD (1-3): doesn't interfere with normal activities.    - MODERATE (4-7): interferes with normal activities or awakens from sleep.   - SEVERE (8-10): excruciating pain, unable to hold a glass of water or bend finger even a little.      mild  8. TETANUS: For any breaks in the skin, ask: "When was the last tetanus booster?"      ?  9. OTHER SYMPTOMS: "Do you have any other symptoms?"      no  10. PREGNANCY: "Is there any chance you are pregnant?" "When was your last menstrual period?"        N/A    Protocols used: Finger Injury-ADULT-[place]

## 2022-04-02 ENCOUNTER — Encounter

## 2022-04-02 MED ORDER — ATENOLOL 50 MG PO TABS
50 MG | ORAL_TABLET | Freq: Two times a day (BID) | ORAL | 3 refills | Status: AC
Start: 2022-04-02 — End: 2022-08-12

## 2022-04-02 NOTE — Telephone Encounter (Signed)
Patient returning call to [facility]. Call transferred to [facility].

## 2022-04-02 NOTE — Telephone Encounter (Signed)
Patient returning Nursing's call....    Reviewed Dr. Conley Canal response with patient along with the name/dosing of the antibiotic sent to [facility] for him. Patient verbalized his understanding.    Encouraged patient to take the Augmentin with food to prevent GI upset.  Follow up/contact the office PRN with any new/worsening symptoms or if not resolution.    Patient did ask for a refill of his atenolol as he forgot request this during his appt yesterday, so he can pick up both the antibiotic & this Rx at the same time.  See Rx Refill Encounter dated 03.05.2024.    Patient had no questions and/or concerns for Nursing or Provider and will follow up with the office as scheduled and as needed!

## 2022-04-02 NOTE — Telephone Encounter (Addendum)
Medications Requested:  Requested Prescriptions     Pending Prescriptions Disp Refills    atenolol (TENORMIN) 50 MG tablet 180 tablet 3     Sig: Take 1 tablet by mouth 2 times daily       Preferred Pharmacy:   [facility] [address] [place] [phone] Wanda Plump [phone]  [place]  [place] [address]  Phone: [phone] Fax: [phone]      Date of Last Refill: 02.11.2024 # 60 with 0 refills  Prescription Refill Protocol reviewed: Yes       Allergy List Reviewed and Verified: Yes  Possible Medication Interactions Reviewed: Yes      Last appt @ PCP Office: 04/01/2022 with Lovie Macadamia, MD    Future Appointments   Date Time Provider [place]   04/03/2022  8:30 AM Milford Cage, DPM NPOD [facility]   04/22/2022 10:35 AM Pho, Wallis Mart, MD NPSSN [facility]   05/08/2022  3:00 PM Reape, Kirstie Peri, MD NPSSN [facility]       MOST RECENT BLOOD PRESSURES  BP Readings from Last 3 Encounters:   04/01/22 (!) 138/58   12/25/21 (!) 164/79   11/07/21 (!) 90/58         MOST RECENT LAB DATA  Lab Results   Component Value Date/Time    K 5.0 03/05/2022 01:43 PM    ALT 24 11/07/2021 03:41 PM    TSH 1.570 10/31/2021 01:38 PM    HGB 11.7 11/07/2021 03:41 PM    HCT 36.4 11/07/2021 03:41 PM

## 2022-04-03 ENCOUNTER — Ambulatory Visit
Admit: 2022-04-03 | Discharge: 2022-04-03 | Payer: MEDICARE | Attending: Foot & Ankle Surgery | Primary: Internal Medicine

## 2022-04-03 DIAGNOSIS — E1142 Type 2 diabetes mellitus with diabetic polyneuropathy: Secondary | ICD-10-CM

## 2022-04-03 MED ORDER — AMMONIUM LACTATE 12 % EX CREA
12 % | CUTANEOUS | 4 refills | Status: DC
Start: 2022-04-03 — End: 2022-04-22

## 2022-04-03 NOTE — Progress Notes (Signed)
Dominic Stephens   Date:  04/03/22     HPI:  This is a 67 year old uncontrolled diabetic male that presents today at the request of his PCP for evaluation of fungal nails as well as at-risk foot care.  His last hemoglobin A1c was 10.6.  He admits to significant numbness in his feet.  He has a history of a left submet 1 ulceration with infection requiring incision and drainage and IV antibiotics at [facility] in 2020.  He develops thick calluses beneath his 1st and 5th metatarsals.  He denies any current open sores, redness and drainage.  He is currently on Augmentin for a finger infection.  He feels as though his shoes are too tight causing damage to his nail plates.  He has noted a fungal infection with thickening and discoloration of the nail plates over the last few years.  No other concerns at today's visit.    Last HbA1c 10.6    PCP last seen:  04/01/2022    Review of systems: Negative except as stated above.     Allergies   Allergen Reactions    Methocarbamol Other (See Comments)    Penicillins Rash     Patient states he can take other medications in this family    Sulfa Antibiotics Myalgia    Sulfamethoxazole-Trimethoprim Myalgia        History reviewed. No pertinent past medical history.     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    Diagnosis unknown 09/11/2011    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          Current Outpatient  Medications:     ammonium lactate (AMLACTIN) 12 % cream, Apply topically as needed., Disp: 385 g, Rfl: 4    atenolol (TENORMIN) 50 MG tablet, Take 1 tablet by mouth 2 times daily, Disp: 180 tablet, Rfl: 3    amoxicillin-clavulanate (AUGMENTIN) 875-125 MG per tablet, Take 1 tablet by mouth 2 times daily for 10 days, Disp: 20 tablet, Rfl: 0    tamsulosin (FLOMAX) 0.4 MG capsule, Take 1 capsule by mouth once daily, Disp: 30 capsule, Rfl: 0    glimepiride (AMARYL) 2 MG tablet, Take 1 tablet by mouth 2 times daily (with meals), Disp: 180 tablet, Rfl: 1    blood glucose test strips (ONETOUCH ULTRA) strip, Test sugars once daily E11.65, Disp: 100 each, Rfl: 3    Blood Glucose Monitoring Suppl (ONE TOUCH ULTRA 2) w/Device KIT, Test sugars once daily E11.65, Disp: 1 kit, Rfl: 0    ONE TOUCH ULTRASOFT LANCETS MISC, Test sugars once daily E11.65, Disp: 100 each, Rfl: 3     Past Surgical History:   Procedure Laterality Date    ORTHOPEDIC SURGERY      Lumbar discecomy 2001        Family History   Problem Relation Age of Onset    Hypertension Mother     Diabetes Father  Diabetes Mother         Social History     Tobacco Use    Smoking status: Never    Smokeless tobacco: Never        Vitals:    04/03/22 0819   BP: (!) 152/78        Physical Exam:  General: The patient is alert and oriented. NAD.  Psych: Please and cooperative with exam.  Vascular: Dorsalis pedis pulses are 2/4 bilaterally. Posterior tibial pulses are 2/4 bilaterally. Temperature gradient is warm to cool proximal to distal. There is mild edema to the lower extremities. Capillary refill is within normal limits.  Neuro: Sensation to light touch is absent with monofilament testing. No reproducible paraesthesias.   Derm:  All 10 nails are thickened, elongated and full of subungual debris.  The right hallux is ingrown medially.  No open wound.  No erythema or signs of secondary infection.  There is a reddish discoloration to the skin.  Skin is thin, shiny and  atrophic.  There is callus beneath the right hallux IPJ, 1st metatarsal heads bilaterally, 5th metatarsal heads bilaterally.  Upon debridement, 3 mm x 2 mm full-thickness ulcerations are present beneath the 1st metatarsal heads bilaterally.  There is no erythema, drainage, warmth or swelling.  No tunnel or probe to bone.  Wound bases are healthy and granular.  No other open lesions, ulcerations, macerations or suspicious lesions.  Pedal hair growth is absent.  No ecchymosis.  MSK: Muscle strength, tone and bulk are normal for age.  He has a semi-rigid plantar flexed 1st metatarsal head bilaterally with decreased ankle range of motion in dorsiflexion.  Hammertoes of the lesser digits.      ASSESSMENT/PLAN:       ICD-10-CM    1. Diabetic polyneuropathy associated with type 2 diabetes mellitus (HCC)  E11.42 DEBRIDEMENT OF NAILS, 6 OR MORE     TRIM HYPERKERATOTIC SKIN LESION,>4      2. Onychomycosis  B35.1 DEBRIDEMENT OF NAILS, 6 OR MORE      3. Foot callus  L84 TRIM HYPERKERATOTIC SKIN LESION,>4     ammonium lactate (AMLACTIN) 12 % cream      4. Plantar flexed metatarsal, unspecified laterality  M21.6X9       5. Hammertoe, bilateral  M20.41     M20.42          I have reviewed the patient's chart for all relevant clinical documentation, imaging and laboratory data.    On exam, he has a significantly high risk foot with deformity, neuropathy, callus, fungal nails and ulceration submet 1 upon debridement bilaterally.  No evidence of infection.    We discussed oral, topical and surgical remedies for mycotic nails.  I have recommended periodic debridement with glycemic control.  Warning signs were discussed in detail.  Education given on signs and symptoms of secondary infection.    All 10 nails were debrided in both thickness and length as medically necessary to prevent secondary infection with the use of a sterile nail Nipper and as needed a Dremel device.      A total of 5 as described in the physical exam were debrided  with a #15 blade revealing submet 1 ulcerations bilaterally. These appear small with minimal depth and no signs of infection.  We discussed his deformity as it pertains to increased pressure beneath the 1st metatarsal head bilaterally.  I dispensed several offloading donut pads.  He can purchase these through the hospital pharmacy.  Bacitracin and a  bandage applied today.  He will continue the same and follow-up in 2 weeks for a wound check.  I do not see an indication for antibiotics at this time.  Currently Augmentin for a finger infection.  He will change the dressing daily and wash this with antibacterial soap and water.  Thoroughly dry and apply the donut pads.  Ammonium lactate may be beneficial for long-term management of his calluses.    The patient was given a prescription for custom-molded diabetic insoles with a pair of extra-depth shoes.  We will offload the 1st metatarsal head bilaterally.  This is medically necessary given the patient's significant neuropathy, deformity with prior ulceration and may ultimately prevent further skin breakdown, infection and amputation.    We discussed diabetic foot care and ulceration prevention.  I encouraged close observation of the feet with daily foot checks.  The patient should avoid barefoot walking.  We discussed glycemic control as it pertains to the diabetic foot.  Encouraged better glycemic control.    Patient requires periodic debridement of their thick dystrophic nails as well as callosities to prevent  skin breakdown, ulceration, infection and ultimately amputation with high risk foot.  All questions were entertained and answered.  I would recommend follow-up at least every 3 months or sooner if needed for at-risk foot care.    He will return in 2 weeks for recheck of his ulcerations.    Please note that portions of this document were created using the M*Modal Fluency Direct dictation system.  Any inconsistencies or typographical errors may be the result of  mis-transcription that persist in spite of proof-reading and should be addressed with the document creator.    [place] Leo Rod, DPM, FACFAS

## 2022-04-04 LAB — CULTURE, URINE
Culture: >100000 — AB
Culture: >100000 — AB

## 2022-04-04 MED ORDER — NITROFURANTOIN MONOHYD MACRO 100 MG PO CAPS
100 | ORAL_CAPSULE | Freq: Two times a day (BID) | ORAL | 0 refills | Status: AC
Start: 2022-04-04 — End: 2022-04-14

## 2022-04-04 NOTE — Telephone Encounter (Signed)
Patient returning call to the office. Warm transfer to nurse line

## 2022-04-04 NOTE — Telephone Encounter (Signed)
Attempted to reach patient, no answer. Left message asking patient to return call to office.

## 2022-04-04 NOTE — Telephone Encounter (Signed)
 Patient returning Nursing's call....    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  Noted that Dr. Enis has also sent over a Rx for nitrofurantoin  for him to take in addition to the Augmentin  as the bacteria are not sensitive to the same antibiotics. He verbalized his understanding.  Again; did recommend that he take with food.    Patient concerned as he has already been on the Augmentin  x 48 hours (today will be 72) and he hasn't noticed much (if any) improvement in his finger yet.   He is afebrile.  Discussed soaking his infected finger in warm water  & Epsom salt x 15 minutes 2-3 times per day.  Otherwise he should keep the finger clean & dry.  Do not pop, pinch, or attempt to drain.  If no improvement by Monday, which will be when he is half-way through the course of Augmentin  he should contact the office, so Nursing can see if Dr. Enis would like him re-evaluated here or sent to Ortho (per his notes) for possible I&D.  Patient verbalized his understanding/agreement.    Patient had no questions and/or concerns for Nursing or Provider and will follow up with the office as scheduled and as needed!

## 2022-04-04 NOTE — Telephone Encounter (Signed)
Pt w uti. Add macrobid 100 mg bid x 10 days in addition to his current abx./kp

## 2022-04-08 ENCOUNTER — Emergency Department: Admit: 2022-04-08 | Payer: MEDICARE | Primary: Internal Medicine

## 2022-04-08 ENCOUNTER — Inpatient Hospital Stay: Admit: 2022-04-08 | Discharge: 2022-04-08 | Disposition: A | Discharge status: Home / Self Care | Payer: MEDICARE

## 2022-04-08 DIAGNOSIS — S61300A Unspecified open wound of right index finger with damage to nail, initial encounter: Secondary | ICD-10-CM

## 2022-04-08 LAB — CBC WITH AUTO DIFFERENTIAL
Absolute Eos #: 0.3 10*3/uL (ref 0.0–0.5)
Absolute Immature Granulocyte: 0.1 10*3/uL — ABNORMAL HIGH (ref 0.00–0.03)
Absolute Lymph #: 1.6 10*3/uL (ref 1.2–3.7)
Absolute Mono #: 0.8 10*3/uL (ref 0.2–0.8)
Basophils Absolute: 0.1 10*3/uL (ref 0.0–0.1)
Basophils: 1 %
Eosinophils %: 3 %
Hematocrit: 47 % (ref 40.1–51.0)
Hemoglobin: 15.5 g/dL (ref 13.7–17.5)
Immature Granulocytes: 1 %
Lymphocytes: 17 %
MCH: 30 PG (ref 25.6–32.2)
MCHC: 33 g/dL (ref 32.2–35.5)
MCV: 90.9 FL (ref 80.0–95.0)
MPV: 8.8 FL — ABNORMAL LOW (ref 9.4–12.4)
Monocytes: 8 %
Nucleated RBCs: 0 PER 100 WBC (ref 0–0.02)
Platelets: 171 10*3/uL (ref 150–400)
RBC: 5.17 M/uL (ref 4.51–5.93)
RDW: 13.8 % (ref 11.6–14.4)
Seg Neutrophils: 70 %
Segs Absolute: 6.8 10*3/uL — ABNORMAL HIGH (ref 1.6–6.1)
WBC: 9.6 10*3/uL (ref 4.0–10.0)
nRBC: 0 10*3/uL

## 2022-04-08 LAB — BASIC METABOLIC PANEL
Anion Gap: 13 mmol/L (ref 5–15)
BUN: 29 MG/DL — ABNORMAL HIGH (ref 8–23)
Bun/Cre Ratio: 23 — ABNORMAL HIGH (ref 12–20)
CO2: 25 mmol/L (ref 22–29)
Calcium: 10 MG/DL (ref 8.8–10.2)
Chloride: 100 mmol/L (ref 98–107)
Creatinine: 1.26 MG/DL — ABNORMAL HIGH (ref 0.67–1.17)
Est, Glom Filt Rate: >60 mL/min/{1.73_m2} (ref >60)
Glucose: 291 mg/dL — ABNORMAL HIGH (ref 74–109)
Potassium: 4.7 mmol/L (ref 3.5–5.1)
Sodium: 138 mmol/L (ref 136–145)

## 2022-04-08 NOTE — ED Provider Notes (Signed)
Chief Complaint   Patient presents with    Wound Infection     Pt ambulating - has a right hand pointer finger laceration from December that is not healing . Patient has Type 2 Diabetes. PCP placed him on 2 kinds of antibiotics but does not do surgical procedures in the office. Patient has been so tired the past couple of days and worried about infection. Here to see if the wound needs to be lanced or opened for infection. He is also worried about conjunctive eyetus.           Patient states he developed a wound on the right 2nd finger at some point back in December.  He does not recall trauma or injury.  He is a diabetic.  He has been soaking it and applying a topical antimicrobial however it has persisted.  He was recently seen by his primary and placed on an antibiotic, Augmentin for this.  Of note he was also seen for UTI and placed on Macrobid.  He states he has been in generally tired but is unsure if it is the urine concern or the finger.  He denies weakness of the digit.  No numbness or tingling.  No difficulty moving it.    The history is provided by the patient.       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    Past Surgical History:   Procedure Laterality Date    ORTHOPEDIC SURGERY      Lumbar discecomy 2001       Social History     Socioeconomic History    Marital status: Single     Spouse name: Not on file    Number of children: Not on  file    Years of education: Not on file    Highest education level: Not on file   Occupational History    Not on file   Tobacco Use    Smoking status: Never    Smokeless tobacco: Never   Substance and Sexual Activity    Alcohol use: Not on file    Drug use: Not on file    Sexual activity: Not on file   Other Topics Concern    Not on file   Social History Narrative    Not on file     Social Determinants of Health     Financial Resource Strain: Low Risk  (04/01/2022)    Overall Financial Resource Strain (CARDIA)     Difficulty of Paying Living Expenses: Not hard at all   Food Insecurity: No Food Insecurity (04/01/2022)    Hunger Vital Sign     Worried About Running Out of Food in the Last Year: Never true     [place] in the Last Year: Never true   Transportation  Needs: Unknown (04/01/2022)    PRAPARE - Armed forces logistics/support/administrative officer (Medical): Not on file     Lack of Transportation (Non-Medical): No   Physical Activity: Not on file   Stress: Not on file   Social Connections: Not on file   Intimate Partner Violence: Not on file   Housing Stability: Unknown (04/01/2022)    Housing Stability Vital Sign     Unable to Pay for Housing in the Last Year: Not on file     Number of Places Lived in the Last Year: Not on file     Unstable Housing in the Last Year: No       Family History:  Family History   Problem Relation Age of Onset    Hypertension Mother     Diabetes Father     Diabetes Mother        Medications:  Patient medication list reviewed  Previous Medications    AMMONIUM LACTATE (AMLACTIN) 12 % CREAM    Apply topically as needed.    AMOXICILLIN-CLAVULANATE (AUGMENTIN) 875-125 MG PER TABLET    Take 1 tablet by mouth 2 times daily for 10 days    ATENOLOL (TENORMIN) 50 MG TABLET    Take 1 tablet by mouth 2 times daily    BLOOD GLUCOSE MONITORING SUPPL (ONE TOUCH ULTRA 2) W/DEVICE KIT    Test sugars once daily E11.65    BLOOD GLUCOSE TEST STRIPS (ONETOUCH ULTRA) STRIP    Test sugars once daily E11.65     GLIMEPIRIDE (AMARYL) 2 MG TABLET    Take 1 tablet by mouth 2 times daily (with meals)    NITROFURANTOIN, MACROCRYSTAL-MONOHYDRATE, (MACROBID) 100 MG CAPSULE    Take 1 capsule by mouth 2 times daily for 10 days    ONE TOUCH ULTRASOFT LANCETS MISC    Test sugars once daily E11.65    TAMSULOSIN (FLOMAX) 0.4 MG CAPSULE    Take 1 capsule by mouth once daily       Anticoagulants / Antiplatelet medications:  This patient does not have an active medication from one of the medication groupers.    Allergies:    Methocarbamol, Penicillins, Sulfa antibiotics, and Sulfamethoxazole-trimethoprim    Review of Systems   Constitutional:  Negative for chills and fever.   HENT:  Negative for trouble swallowing.    Eyes:  Negative for photophobia and visual disturbance.   Respiratory:  Negative for cough and shortness of breath.    Cardiovascular:  Negative for chest pain and palpitations.   Gastrointestinal:  Negative for diarrhea and nausea.   Musculoskeletal:  Negative for arthralgias and back pain.   Neurological:  Negative for dizziness and headaches.   Psychiatric/Behavioral:  Negative for agitation.        ED Triage Vitals [04/08/22 1036]   BP Temp Temp Source Pulse Respirations SpO2 Height Weight   (!) 154/75 97.6 F (36.4 C) Tympanic 61 18 96 % -- --     Physical Exam  Vitals and nursing note reviewed.   Constitutional:       General: He is not in acute distress.     Appearance: Normal appearance.   HENT:      Mouth/Throat:      Mouth: Mucous membranes are moist.   Eyes:      Extraocular Movements: Extraocular movements intact.      Conjunctiva/sclera: Conjunctivae normal.   Cardiovascular:      Rate and Rhythm: Normal rate and regular rhythm.   Pulmonary:  Effort: Pulmonary effort is normal. No respiratory distress.   Musculoskeletal:         General: Normal range of motion.        Hands:       Cervical back: Normal range of motion.      Comments: .4x.4x0 area of eschar.   Minimal erythema, not hot to the touch.  Does not  include joint.  There is some tenderness to the distal phalange.  Full range of motion of all joints of that finger.  No crepitus.   Skin:     General: Skin is warm and dry.   Neurological:      Mental Status: He is alert.               ED Course:  ED Course as of 04/08/22 1442   Mon Apr 08, 2022   1207 TLE applied; plan will be to remove eschar and attempt culture [JW]      ED Course User Index  [JW] Valinda Hoar, [place]       Procedures      Medical Decision Making:  Pt presents with a chronic finger wound. I attempted to remove the eschar but could not secondary to discomfort. There is no obvious drainage or heat/no significant swelling to suggest an acute infection. XR also unremarkable. Advised on wound care at home, continue current abx. Discussed glucose elevation which his pcp knows about as well. He was given follow up for hand specialist but otherwise advised to return if new or worsening symptoms or other concerns. He agreed with plan. All questions were answered    Vital Signs for this visit:  Vitals:    04/08/22 1036   BP: (!) 154/75   Pulse: 61   Resp: 18   Temp: 97.6 F (36.4 C)   TempSrc: Tympanic   SpO2: 96%       Lab findings during this visit (only abnormal values will be noted, if no value noted then the result was normal range):  Labs Reviewed - No data to display    Radiology studies during this visit and the past 24 hours:  [place] RETROPERITONEAL COMPLETE    Result Date: 03/14/2022  EXAM: [place] RETROPERITONEUM COMP HISTORY: Elevated creatine, urinary retention has Foley in place creatine has gone up COMPARISON: None. TECHNIQUE: Transabdominal sonogram of the kidneys. FINDINGS: The right kidney measures 13.6 cm.  Small parapelvic cyst is seen. Left kidney measures 14.3 cm also with parapelvic cysts.  9 mm nonobstructing stone is seen lower pole.  A cyst is seen arising from the lower pole measuring 4.0 x 3.5 x 3.4 cm. The urinary bladder is not seen as a Foley catheter is in place.     1.  Nonobstructing left renal calculus. 2. Bilateral renal cysts.       Medications given in the ED:  Medications - No data to display    Diagnosis:  No diagnosis found.         Condition at disposition:  improved    DISPOSITION          Discharge prescriptions and/or changes if applicable:       Medication List        CONTINUE taking these medications      ONE TOUCH ULTRA 2 w/Device Kit  Test sugars once daily E11.65     ONE TOUCH ULTRASOFT LANCETS Misc  Test sugars once daily E11.65            ASK your  doctor about these medications      ammonium lactate 12 % cream  Commonly known as: AMLACTIN  Apply topically as needed.     amoxicillin-clavulanate 875-125 MG per tablet  Commonly known as: AUGMENTIN  Take 1 tablet by mouth 2 times daily for 10 days     atenolol 50 MG tablet  Commonly known as: TENORMIN  Take 1 tablet by mouth 2 times daily     glimepiride 2 MG tablet  Commonly known as: AMARYL  Take 1 tablet by mouth 2 times daily (with meals)     nitrofurantoin (macrocrystal-monohydrate) 100 MG capsule  Commonly known as: MACROBID  Take 1 capsule by mouth 2 times daily for 10 days     OneTouch Ultra strip  Generic drug: blood glucose test strips  Test sugars once daily E11.65     tamsulosin 0.4 MG capsule  Commonly known as: FLOMAX  Take 1 capsule by mouth once daily              Follow-up if applicable:  No follow-up provider specified.    Please note that portions of this document were created using the M*Modal Fluency Direct dictation system.  Any inconsistencies or typographical errors may be the result of mis-transcription that persist in spite of proof-reading and should be addressed with the document creator.        Valinda Hoar, [place]  04/08/22 1445

## 2022-04-08 NOTE — Discharge Instructions (Signed)
You have a wound on your finger that will need to be seen by a specialist. Until then, keep the area clean and dry. Cleanse each day then apply a dry clean bandage. Keep taking your antibiotic as prescribed. Return with any new or worsening symptoms or other concerns

## 2022-04-08 NOTE — ED Notes (Signed)
Patient discharged from the department and paper work provided. Patient verbalized understanding of discharge instructions. PIV removed. Pt ambulatory out of the department with a steady gait. NAD upon departure.

## 2022-04-10 ENCOUNTER — Ambulatory Visit: Attending: Internal Medicine | Primary: Internal Medicine

## 2022-04-17 ENCOUNTER — Ambulatory Visit
Admit: 2022-04-17 | Discharge: 2022-04-17 | Payer: MEDICARE | Attending: Foot & Ankle Surgery | Primary: Internal Medicine

## 2022-04-17 DIAGNOSIS — E1142 Type 2 diabetes mellitus with diabetic polyneuropathy: Secondary | ICD-10-CM

## 2022-04-17 NOTE — Progress Notes (Signed)
Dominic Stephens   Date:  04/17/22     HPI:  This is a 67 year old uncontrolled diabetic male that presents today for follow-up of bilateral sub 1st metatarsal head ulcerations.  He has done well since our initial visit.  He denies any pain.  He has been applying bacitracin, Band-Aids and donut pads to offload the area.  He has found his old pair of customized offloading inserts.  They do have a 1st ray cutout.  He has returned to his diabetic shoe gear.  He is awaiting casting for new orthotics and extra-depth shoes at [facility].  No significant drainage.  He believes the wounds are improving.  His last hemoglobin A1c was 10.6.  No fevers or chills.  No other concerns at today's visit.    Review of systems: Negative except as stated above.     Allergies   Allergen Reactions    Methocarbamol Other (See Comments)    Penicillins Rash     Patient states he can take other medications in this family    Sulfa Antibiotics Myalgia    Sulfamethoxazole-Trimethoprim Myalgia        No past medical history on file.     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    Diagnosis unknown 09/11/2011    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          Current Outpatient Medications:     ammonium lactate (AMLACTIN) 12 % cream, Apply topically as needed., Disp: 385 g, Rfl: 4    atenolol (TENORMIN) 50 MG tablet, Take 1 tablet by mouth 2 times daily, Disp: 180 tablet, Rfl: 3     tamsulosin (FLOMAX) 0.4 MG capsule, Take 1 capsule by mouth once daily, Disp: 30 capsule, Rfl: 0    glimepiride (AMARYL) 2 MG tablet, Take 1 tablet by mouth 2 times daily (with meals), Disp: 180 tablet, Rfl: 1    blood glucose test strips (ONETOUCH ULTRA) strip, Test sugars once daily E11.65, Disp: 100 each, Rfl: 3    Blood Glucose Monitoring Suppl (ONE TOUCH ULTRA 2) w/Device KIT, Test sugars once daily E11.65, Disp: 1 kit, Rfl: 0    ONE TOUCH ULTRASOFT LANCETS MISC, Test sugars once daily E11.65, Disp: 100 each, Rfl: 3     Past Surgical History:   Procedure Laterality Date    ORTHOPEDIC SURGERY      Lumbar discecomy 2001        Family History   Problem Relation Age of Onset    Hypertension Mother     Diabetes Father     Diabetes Mother         Social History     Tobacco Use    Smoking status: Never    Smokeless tobacco: Never        Vitals:    04/17/22 1614   BP: (!) 150/78          Physical Exam:  General: The patient is alert and oriented. NAD.  Psych: Please and cooperative with exam.  Vascular: Dorsalis pedis pulses are 2/4 bilaterally. Posterior tibial pulses are 2/4 bilaterally. Temperature gradient is warm to cool proximal to distal. There is mild edema to the lower extremities. Capillary refill is within normal limits.  Neuro: Sensation to light touch is absent with monofilament testing. No reproducible paraesthesias.   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 There is a reddish discoloration to the skin.  Skin is thin, shiny and atrophic.  There is significant hemorrhagic callus, but improved beneath the 1st metatarsal heads bilaterally.  Pinpoint wound noted to the right 1st metatarsal head as seen in the picture below.  3 mm x 2 mm ulceration noted to the 1st metatarsal head of the left foot.  There is a healthy granular tissue base.  No tunneling or probe to bone.  There is no erythema, drainage, warmth or swelling. No other open lesions, ulcerations, macerations or suspicious lesions.  Pedal hair  growth is absent.  No ecchymosis.  MSK: Muscle strength, tone and bulk are normal for age.  He has a semi-rigid plantar flexed 1st metatarsal head bilaterally with decreased ankle range of motion in dorsiflexion.  Hammertoes of the lesser digits.        ASSESSMENT/PLAN:       ICD-10-CM    1. Diabetic polyneuropathy associated with type 2 diabetes mellitus (HCC)  E11.42       2. Plantar flexed metatarsal, unspecified laterality  M21.6X9       3. 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    On exam, last callus formation.  Ulceration to the submet 1 on right nearly healed.  A small full-thickness wound remains on the left side as demonstrated in the picture below.  Callus tissue was removed with a 15. Blade.  Dressed with bacitracin, Band-Aid and offloading donut pad.    I inspected his diabetic shoes and insoles.  He is awaiting new customized insoles.  His current pair are quite well worn in need of replacement.  They do have a 1st ray cutout bilaterally.  I added additional offloading pad beneath the 1st metatarsal head bilaterally.  He will switch from the bacitracin to Medihoney to be applied daily.  This was demonstrated and dispensed.  Continue with the offloading donut pads and limit time on his feet.    We discussed diabetic foot care and ulceration prevention.  I encouraged close observation of the feet with daily foot checks.  The patient should avoid barefoot walking.  We discussed glycemic control as it pertains to the diabetic foot.  Encouraged better glycemic control.    He was educated on the signs and symptoms of infection will contact the office with any questions or concerns or report to the emergency department.  He understands that he remains at high risk for continued  or worsening ulceration, infection and limb loss.  He will return in 3-4 weeks for recheck or sooner if needed.  Patient understands and is satisfied with the plan moving forward.  He will contact the office with any questions or concerns.    Please note that portions of this document were created using the M*Modal  Fluency Direct dictation system.  Any inconsistencies or typographical errors may be the result of mis-transcription that persist in spite of proof-reading and should be addressed with the document creator.    Makelle Marrone Leo Rod, DPM, FACFAS

## 2022-04-22 ENCOUNTER — Inpatient Hospital Stay: Admit: 2022-04-22 | Payer: MEDICARE | Primary: Internal Medicine

## 2022-04-22 ENCOUNTER — Ambulatory Visit: Admit: 2022-04-22 | Discharge: 2022-04-22 | Payer: MEDICARE | Attending: Internal Medicine | Primary: Internal Medicine

## 2022-04-22 DIAGNOSIS — E1165 Type 2 diabetes mellitus with hyperglycemia: Secondary | ICD-10-CM

## 2022-04-22 DIAGNOSIS — R300 Dysuria: Secondary | ICD-10-CM

## 2022-04-22 MED ORDER — NITROFURANTOIN MONOHYD MACRO 100 MG PO CAPS
100 | ORAL_CAPSULE | Freq: Two times a day (BID) | ORAL | 0 refills | Status: AC
Start: 2022-04-22 — End: 2022-05-02

## 2022-04-22 MED ORDER — DOXYCYCLINE HYCLATE 100 MG PO TABS
100 | ORAL_TABLET | Freq: Two times a day (BID) | ORAL | 0 refills | Status: AC
Start: 2022-04-22 — End: 2022-05-02

## 2022-04-22 MED ORDER — ERYTHROMYCIN 5 MG/GM OP OINT
5 | OPHTHALMIC | 1 refills | Status: AC
Start: 2022-04-22 — End: 2022-05-02

## 2022-04-22 NOTE — Progress Notes (Signed)
Subjective:   Dominic Stephens is a 67 y.o. male presenting for the following issues:    The patient presented today with cellulitis in their right second finger. They previously went to the emergency room where an Incision and Drainage (I&D) was attempted. The patient's X-ray results were negative. The patient was also previously diagnosed with a urinary tract infection (UTI) and has completed a course of Macrobid. However, the patient is still experiencing symptoms from the UTI.    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    AKI (acute kidney injury) ([place])    Unspecified severe protein-calorie malnutrition ([place])    Type 2 diabetes mellitus with hyperglycemia, without long-term current use of insulin (HCC)    Type 2 diabetes mellitus    Primary hypertension       Current Outpatient Medications   Medication Sig    atenolol (TENORMIN) 50 MG tablet Take 1 tablet by mouth 2 times daily    tamsulosin (FLOMAX) 0.4 MG capsule Take 1 capsule by mouth once daily    glimepiride (AMARYL) 2 MG tablet Take 1 tablet by mouth 2 times daily (with meals)    blood glucose test strips (ONETOUCH ULTRA) strip Test sugars once daily E11.65    Blood Glucose Monitoring Suppl (ONE TOUCH ULTRA 2) w/Device KIT Test sugars once daily E11.65    ONE TOUCH ULTRASOFT LANCETS MISC Test sugars once daily E11.65    ammonium lactate (AMLACTIN) 12 % cream Apply topically as needed. (Patient not taking: Reported on 04/22/2022)     No current facility-administered medications for this visit.       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    Diagnosis unknown 09/11/2011    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         CBC:   Lab Results   Component Value Date/Time    WBC 9.6 04/08/2022 11:45 AM    RBC 5.17 04/08/2022 11:45 AM    HGB 15.5 04/08/2022 11:45 AM    HCT 47.0 04/08/2022 11:45 AM    MCV 90.9 04/08/2022 11:45 AM    MCH 30.0 04/08/2022 11:45 AM    MCHC 33.0 04/08/2022 11:45 AM    RDW 13.8 04/08/2022 11:45 AM    PLT 171 04/08/2022 11:45 AM    MPV 8.8 04/08/2022 11:45 AM     CBC with Differential:    Lab Results   Component Value Date/Time    WBC 9.6 04/08/2022 11:45 AM    RBC 5.17 04/08/2022  11:45 AM    HGB 15.5 04/08/2022 11:45 AM    HCT 47.0 04/08/2022 11:45 AM    PLT 171 04/08/2022 11:45 AM    MCV 90.9 04/08/2022 11:45 AM    MCH 30.0 04/08/2022 11:45 AM    MCHC 33.0 04/08/2022 11:45 AM    RDW 13.8 04/08/2022 11:45 AM    NRBC 0.0 04/08/2022 11:45 AM    NRBC 0.00 04/08/2022 11:45 AM    LYMPHOPCT 17 04/08/2022 11:45 AM    MONOPCT 8 04/08/2022 11:45 AM    BASOPCT 1 04/08/2022 11:45 AM    MONOSABS 0.8 04/08/2022 11:45 AM    LYMPHSABS 1.6 04/08/2022 11:45 AM    EOSABS 0.3 04/08/2022 11:45 AM    BASOSABS 0.1 04/08/2022 11:45 AM    DIFFTYPE AUTOMATED 04/08/2022 11:45 AM     CMP:    Lab Results   Component Value Date/Time    NA 138 04/08/2022 11:45 AM    K 4.7 04/08/2022 11:45 AM    CL 100 04/08/2022 11:45 AM    CO2 25 04/08/2022 11:45 AM    BUN 29 04/08/2022 11:45 AM    CREATININE 1.26 04/08/2022 11:45 AM    GFRAA 74 01/21/2019 11:14 AM    AGRATIO 1.0 11/07/2021 03:41 PM    LABGLOM  >60 04/08/2022 11:45 AM    GLUCOSE 291 04/08/2022 11:45 AM    PROT 8.1 11/07/2021 03:41 PM    LABALBU 4.0 11/07/2021 03:41 PM    CALCIUM 10.0 04/08/2022 11:45 AM    BILITOT 0.27 11/07/2021 03:41 PM    ALKPHOS 104 11/07/2021 03:41 PM    ALKPHOS 92 01/21/2019 11:14 AM    AST 27 11/07/2021 03:41 PM    ALT 24 11/07/2021 03:41 PM     BMP:    Lab Results   Component Value Date/Time    NA 138 04/08/2022 11:45 AM    K 4.7 04/08/2022 11:45 AM    CL 100 04/08/2022 11:45 AM    CO2 25 04/08/2022 11:45 AM    BUN 29 04/08/2022 11:45 AM    LABALBU 4.0 11/07/2021 03:41 PM    CREATININE 1.26 04/08/2022 11:45 AM    CALCIUM 10.0 04/08/2022 11:45 AM    GFRAA 74 01/21/2019 11:14 AM    LABGLOM >60 04/08/2022 11:45 AM    GLUCOSE 291 04/08/2022 11:45 AM        No results found for: "CHOL"  No results found for: "TRIG"  No results found for: "HDL"  No results found for: "LDLCHOLESTEROL", "LDLCALC"  No results found for: "VLDL"  No results found for: "CHOLHDLRATIO"      Objective:     BP 124/70 (Site: Right Upper Arm, Position: Sitting, Cuff Size: Large Adult)   Pulse 64   Wt 131.7 kg (290 lb 6.4 oz)   BMI 34.44 kg/m     General Appearance: alert and oriented to person, place and time, well-developed and well-nourished, in no acute distress  Pulmonary/Chest: clear to auscultation bilaterally- no wheezes, rales or rhonchi, normal air movement, no respiratory distress  Cardiovascular: normal rate, normal S1 and S2, no gallops, intact distal pulses, and no carotid bruits    R 2nd finger: red in nail, no drainage  L eye: sty    Assessment/Plan:     Dominic Stephens was seen today for follow-up.    Diagnoses and all orders for this visit:    Type 2 diabetes mellitus with hyperglycemia, without long-term current use of insulin (HCC)    Dysuria  -  Urinalysis with Reflex Micro to Culture; Future    Cellulitis, unspecified cellulitis site  -     doxycycline hyclate (VIBRA-TABS) 100 MG tablet; Take 1 tablet by mouth 2 times daily for 10  days    Hordeolum externum of left lower eyelid  -     erythromycin (ROMYCIN) 5 MG/GM ophthalmic ointment; 1 cm tid to l eye    Other orders  -     nitrofurantoin, macrocrystal-monohydrate, (MACROBID) 100 MG capsule; Take 1 capsule by mouth 2 times daily for 10 days    1. Finger Infection: The patient is advised to start Doxycycline for the management of the finger infection. If there is no improvement, a follow-up with orthopedics is recommended.     2. Urinary Tract Infection (UTI): The patient is advised to start another course of Macrobid for the management of UTI. A urine analysis (UA) should be checked to monitor the effectiveness of the treatment.    3. Diabetes Mellitus (DM): The patient should continue their current regimen of Glimepiride for the management of diabetes mellitus. Regular blood glucose monitoring is recommended to ensure the effectiveness of the medication and continued control of blood sugar levels.    4. Benign Prostatic Hyperplasia (BPH): The patient is advised to continue their current regimen of Flomax for the management of BPH. Regular follow-ups are recommended to monitor the patient's urinary symptoms and adjust the treatment plan as necessary.    5. Sty: The patient is advised to use Erythromycin ointment for the management of the sty. Regular follow-up is recommended to monitor the healing of the sty and the effectiveness of the ointment.    Medical decision making    Moderate number and complexity of problem:    2 or more stable chronic illnesses    Moderate risk of morbidity from additional diagnostic testing or treatment:    Prescription drug management    The patient's chart, labs, diagnostic tests, and medications were reviewed. This was followed by a face to face encounter with the patient that involved counseling, lab and diagnostic test review, coordination of care, detailing the pros and cons of the treatment plan, and answering all of the patient's questions. The patient's  record was updated to accurately reflect the medication list and active problem list. A note documenting the encounter was written.     Total time spent for this encounter includes the following:    Preparing to see the patient (review of diagnostic tests, labs, and medications)  Obtaining/reviewing separately obtained history  Performing a medically appropriate examination and/or evaluation  Counseling/education of the patient/family  Ordering medications, tests, or procedures  Referring and communicating with other health care professionals  Documenting clinical information in the electronic health record  Independently interpreting results and communicating results to the patient/family/caregiver  Care coordination     Total time spent: 20 minutes    No follow-up provider specified.  Future Appointments   Date Time Provider [place]   05/08/2022  3:00 PM Reape, Kirstie Peri, MD NPSSN [facility]   05/22/2022  2:30 PM Milford Cage, DPM NPOD [facility]       Kern Reap, MD  04/22/2022

## 2022-04-22 NOTE — Addendum Note (Signed)
Addended by: Becky Augusta T on: 04/22/2022 11:36 AM     Modules accepted: Orders

## 2022-04-24 LAB — CULTURE, URINE: Culture: >100000 — AB

## 2022-05-02 NOTE — Telephone Encounter (Signed)
Patient returning call, please return call ASAP.

## 2022-05-02 NOTE — Telephone Encounter (Addendum)
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     nitrofurantoin Sensitive

## 2022-05-03 NOTE — Telephone Encounter (Signed)
Left message for pt to return call.

## 2022-05-06 NOTE — Telephone Encounter (Signed)
Patient returning call, transferred to [facility].

## 2022-05-06 NOTE — Telephone Encounter (Signed)
Call from [place]. Reviewed DR notes.   He was seen on 3/25 and given Doxy and Nitrofurantoin. Doxy for finger, nitrofurantoin for urine. Also emycin for eye issue.     He has an appt this week. He has an indwelling urinary cath and follows since Sept with Urology.   He does  believe the urine issue has resolved. He had quite a bit of sentiment and that is better.   Discussed his indwelling cath, colonization vs true infection with bacteria. Best ways to obtain UA with cath patients.   He will discuss with you at visit on Wednesday.   His finger is of concern as he is not sure if it is getting better or needs more intervention.   Wednesday will be one week off abxs.   We talked about controlling DM with A1c less than 7 to aid in wound healing (he brought this up as a concern.Marland Kitchen)    He will discuss all the visit on 4/10

## 2022-05-08 ENCOUNTER — Inpatient Hospital Stay: Admit: 2022-05-08 | Payer: MEDICARE | Primary: Internal Medicine

## 2022-05-08 ENCOUNTER — Ambulatory Visit: Admit: 2022-05-08 | Discharge: 2022-05-08 | Payer: MEDICARE | Attending: Internal Medicine | Primary: Internal Medicine

## 2022-05-08 DIAGNOSIS — N390 Urinary tract infection, site not specified: Secondary | ICD-10-CM

## 2022-05-08 DIAGNOSIS — E1165 Type 2 diabetes mellitus with hyperglycemia: Secondary | ICD-10-CM

## 2022-05-08 LAB — URINALYSIS WITH REFLEX TO CULTURE
Bilirubin Urine: NEGATIVE
Glucose, Ur: NORMAL mg/dL
Ketones, Urine: NEGATIVE mg/dL
Leukocyte Esterase, Urine: 500 uL — AB
Nitrite, Urine: NEGATIVE
Protein, Urine: 30 mg/dL — AB
Specific Gravity, UA: 1.021 (ref 1.008–1.030)
Urobilinogen, Urine: NORMAL EU/dL
WBC, UA: >100 /hpf — AB (ref 0–5)
pH, Urine: 5.5 (ref 5.0–8.0)

## 2022-05-08 LAB — AMB POC URINALYSIS DIP STICK MANUAL W/O MICRO
Bilirubin, Urine, POC: NEGATIVE
Glucose, Urine, POC: NEGATIVE
Ketones, Urine, POC: NEGATIVE
Nitrite, Urine, POC: NEGATIVE
Protein, Urine, POC: 100
Specific Gravity, Urine, POC: 1.025 (ref 1.001–1.035)
Urobilinogen, POC: 0.2
pH, Urine, POC: 5.5 (ref 4.6–8.0)

## 2022-05-08 LAB — AMB POC HEMOGLOBIN A1C: Hemoglobin A1C, POC: 7.4 %

## 2022-05-08 NOTE — Progress Notes (Signed)
Dominic Stephens (DOB:  1955/05/18) is a 67 y.o. male,Established patient, here for evaluation of the following chief complaint(s):  Urinary Tract Infection and Finger Pain         ASSESSMENT/PLAN:  1. Type 2 diabetes mellitus with hyperglycemia, without long-term current use of insulin (HCC)  -     AMB POC HEMOGLOBIN A1C  -     Microalbumin / Creatinine Urine Ratio; Future  -     Hemoglobin A1C; Future  -     Referral to [facility]  2. Primary hypertension  -     CBC with Auto Differential; Future  -     Comprehensive Metabolic Panel; Future  -     Lipid Panel; Future  3. Indwelling Foley catheter present  -     AMB POC URINALYSIS DIP STICK MANUAL W/O MICRO  -     Urinalysis with Reflex Micro to Culture; Future  4. Finger infection  5. Screen for colon cancer  -     Cologuard (Fecal DNA Colorectal Cancer Screening)  6. Screening PSA (prostate specific antigen)  -     PSA Screening; Future  7. History of UTI  -     AMB POC URINALYSIS DIP STICK MANUAL W/O MICRO  -     Urinalysis with Reflex Micro to Culture; Future      No follow-ups on file.         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  He is considering procedure for bladder stone as well as a TURP.  He has had a wound of his right index finger since December which has slowly been healing           Objective   Physical Exam  Well-developed well-nourished male no acute distress  Head eyes ears nose and throat are normal  Neck is supple no JVD  Heart is S1-S2 regular rhythm  Lungs are clear to auscultation percussion  Abdomen is soft bowel sounds are positive  Extremities no cyanosis clubbing or edema wound on right index finger does not appear infected  Neurological awake alert oriented             An electronic signature was used to  authenticate this note.    --Juanita Laster, MD

## 2022-05-10 ENCOUNTER — Inpatient Hospital Stay: Admit: 2022-05-10 | Discharge: 2022-05-10 | Disposition: A | Discharge status: Home / Self Care | Payer: MEDICARE

## 2022-05-10 DIAGNOSIS — R339 Retention of urine, unspecified: Secondary | ICD-10-CM

## 2022-05-10 LAB — URINALYSIS WITH REFLEX TO CULTURE
Bilirubin Urine: NEGATIVE
Blood, Urine: NEGATIVE
Glucose, Ur: NORMAL mg/dL
Ketones, Urine: NEGATIVE mg/dL
Leukocyte Esterase, Urine: 75 uL — AB
Nitrite, Urine: NEGATIVE
Protein, Urine: NEGATIVE mg/dL
Specific Gravity, UA: 1.015 (ref 1.008–1.030)
Urobilinogen, Urine: NORMAL EU/dL
pH, Urine: 6 (ref 5.0–8.0)

## 2022-05-10 LAB — CULTURE, URINE
Culture: <10000
Culture: >100000 — AB

## 2022-05-10 MED ORDER — CIPROFLOXACIN HCL 250 MG PO TABS
250 | ORAL | Status: AC
Start: 2022-05-10 — End: 2022-05-10
  Administered 2022-05-10: 08:00:00 500 mg via ORAL

## 2022-05-10 MED ORDER — CIPROFLOXACIN HCL 500 MG PO TABS
500 | ORAL_TABLET | Freq: Two times a day (BID) | ORAL | 0 refills | Status: AC
Start: 2022-05-10 — End: 2022-05-17

## 2022-05-10 MED FILL — CIPROFLOXACIN HCL 250 MG PO TABS: 250 MG | ORAL | Qty: 2

## 2022-05-10 NOTE — ED Notes (Signed)
Chronic foley removed. Stringy blood clots and thick purulence noted at tube drainage hole. Patient attempting to void s/p removal. Provider notified.

## 2022-05-10 NOTE — ED Triage Notes (Signed)
Patient presents with urinary retention from a blocked catheter. States that there is blood in the bag and discomfort in prostate area. Patient reports no prior issues and is concerned for kidneys.

## 2022-05-10 NOTE — ED Notes (Signed)
Bladder scan  680cc.

## 2022-05-10 NOTE — Other (Signed)
This patient is asymptomatic.  He is currently taking Cipro.  The patient has grown out methicillin-resistant Staph aureus.  It is sensitive to Macrobid which he has finished 2 courses of Macrobid over the past month for this MRSA.  The patient will discontinue the Cipro.  He has found a way to ejaculate while having the Foley catheter in place and questions whether the Foley catheter is preventing the semen from exiting potentially leading to prostate pain and or issues.  Feels he could have prostatitis.  He is with pain to the perineal area.  He is without fevers, rigors, shakes, sweats or abdominal pains.  The patient has an appointment with his urologist scheduled for a proximally 4 weeks from now.  The patient has opted not to have a TURP to manage his prostatic hypertrophy.  His PSA levels have been low.  The patient is to seek re-evaluation if having fevers or worsening symptoms.  His Foley catheter is to be changed on a monthly basis.  He was asked to call his urologist office Monday morning to see if they can see him sooner.

## 2022-05-10 NOTE — ED Provider Notes (Signed)
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  He has not had any fevers or vomiting.  He did notice a few small blood clots in the catheter as well.    Medical History:  Current Problem List:   Patient Active Problem List   Diagnosis    Bladder stones    Renal stones    Urinary retention due to benign prostatic hyperplasia    Impotence of organic origin    Type 2 diabetes mellitus with hyperglycemia, without long-term current use of insulin (HCC)    Primary hypertension    Indwelling Foley catheter present    Finger infection       Past Medical History:  No past medical history on file.    Past Surgical History:   Procedure Laterality Date    ORTHOPEDIC SURGERY      Lumbar discecomy 2001       Social History     Socioeconomic History    Marital status: Single     Spouse name: Not on file    Number of children: Not on file    Years of education: Not on file    Highest education level: Not on file   Occupational History    Not on file   Tobacco Use    Smoking status: Never    Smokeless tobacco: Never   Substance and Sexual Activity    Alcohol use: Not on file    Drug use: Not on file    Sexual activity: Not on file   Other Topics Concern    Not on file   Social History Narrative    Not on file     Social Determinants of Health     Financial Resource Strain: Low Risk  (04/01/2022)    Overall Financial Resource Strain (CARDIA)     Difficulty of Paying Living Expenses: Not hard at all   Food Insecurity: No Food Insecurity (04/01/2022)    Hunger Vital Sign     Worried About Running Out of Food in the Last Year: Never true     Ran Out of Food in the Last Year: Never true   Transportation Needs: Unknown (04/01/2022)    PRAPARE - Therapist, art (Medical): Not on file     Lack of Transportation (Non-Medical): No   Physical  Activity: Not on file   Stress: Not on file   Social Connections: Not on file   Intimate Partner Violence: Not on file   Housing Stability: Unknown (04/01/2022)    Housing Stability Vital Sign     Unable to Pay for Housing in the Last Year: Not on file     Number of Places Lived in the Last Year: Not on file     Unstable Housing in the Last Year: No       Family History:  Family History   Problem Relation Age of Onset    Hypertension Mother     Diabetes Father     Diabetes Mother        Medications:  Patient medication list reviewed  This patient does not have an active medication from one of the medication groupers.    Allergies:    Methocarbamol, Penicillins, Sulfa antibiotics, and Sulfamethoxazole-trimethoprim    Review of Systems  Suprapubic pressure and distention,  catheter is not draining well, small specks of blood in the catheter, no fever vomiting    Vital Signs for this visit:  Vitals:    05/10/22 0157   BP: (!) 162/82   Pulse: 74   Resp: 16   Temp: 97.3 F (36.3 C)   TempSrc: Temporal   SpO2: 95%   Weight: 128.8 kg (284 lb)   Height: 1.956 m (6\' 5" )         Physical Exam  Gen: alert, uncomfortable  Cardiac regular  Abdomen with suprapubic distension  Catheter with some thick sediment in tubing , and small specks of blood with mostly yellow urine in bag  Procedures      Medical Decision Making  Amount and/or Complexity of Data Reviewed  Labs: ordered.    Risk  Prescription drug management.              Lab orders and findings that I have personally reviewed and interpreted during this visit.  Only abnormal values will be noted:  Abnormal Labs Reviewed   URINALYSIS WITH REFLEX TO CULTURE - Abnormal; Notable for the following components:       Result Value    Color, UA Light Yellow (*)     Leukocyte Esterase, Urine 75 (*)     RBC, UA 0-3 (*)     WBC, UA 11-20 (*)     All other components within normal limits       Recent radiology studies including this visit.  I have personally reviewed these studies and my  personal interpretation, if available, is documented in the ED Course:  No orders to display       Medications given in the ED:  Medications   ciprofloxacin (CIPRO) tablet 500 mg (500 mg Oral Given 05/10/22 0401)       Diagnosis:  1. Urinary retention    2. Catheter (urine) change required    3. Urinary tract infection associated with indwelling urethral catheter, initial encounter [facility])        ED Course:   67 yo m c/o foley not draining and bladder discomfort. On bladder scan he has 600cc urine in bladder. He has chronic foley. Foley replaced and new urine was sent. He has bacteria and white cells so he was started on cipro bid x 7 days. F/u urology  Clinical Impression:   Final diagnoses:   Urinary retention   Catheter (urine) change required   Urinary tract infection associated with indwelling urethral catheter, initial encounter (HCC)     Condition: Good  Disposition: Discharge      Lab findings during this visit (only abnormal values will be noted, if no value noted then the result was normal range):  Labs Reviewed   URINALYSIS WITH REFLEX TO CULTURE - Abnormal; Notable for the following components:       Result Value    Color, UA Light Yellow (*)     Leukocyte Esterase, Urine 75 (*)     RBC, UA 0-3 (*)     WBC, UA 11-20 (*)     All other components within normal limits   CULTURE, URINE       Radiology studies during this visit  No orders to display       Medications given in the ED:  Medications   ciprofloxacin (CIPRO) tablet 500 mg (500 mg Oral Given 05/10/22 0401)       Discharge prescriptions and/or changes if applicable:       Medication List  START taking these medications      ciprofloxacin 500 MG tablet  Commonly known as: CIPRO  Take 1 tablet by mouth 2 times daily for 7 days            CONTINUE taking these medications      ONE TOUCH ULTRA 2 w/Device Kit  Test sugars once daily E11.65     ONE TOUCH ULTRASOFT LANCETS Misc  Test sugars once daily E11.65            ASK your doctor about these  medications      atenolol 50 MG tablet  Commonly known as: TENORMIN  Take 1 tablet by mouth 2 times daily     glimepiride 2 MG tablet  Commonly known as: AMARYL  Take 1 tablet by mouth 2 times daily (with meals)     OneTouch Ultra strip  Generic drug: blood glucose test strips  Test sugars once daily E11.65               Where to Get Your Medications        These medications were sent to [facility] [place], [place] - [address] - P [phone] - F [phone]  [address], [place] [address]      Phone: [phone]   ciprofloxacin 500 MG tablet         Follow-up:  Juanita Laster, MD  [address] [place] [address]  [phone]      As needed      Please note that portions of this document were created using the M*Modal Fluency Direct dictation system.  Any inconsistencies or typographical errors may be the result of mis-transcription that persist in spite of proof-reading and should be addressed with the document creator.         Please note that portions of this document were created using the M*Modal Fluency Direct dictation system.  Any inconsistencies or typographical errors may be the result of mis-transcription that persist in spite of proof-reading and should be addressed with the document creator.       Baxter Flattery, MD  05/10/22 [phone]

## 2022-05-10 NOTE — Discharge Instructions (Signed)
Take antibiotic x 7 days, f/u urology

## 2022-05-12 LAB — CULTURE, URINE: Culture: <10000 — AB

## 2022-05-22 ENCOUNTER — Ambulatory Visit
Admit: 2022-05-22 | Discharge: 2022-05-22 | Payer: MEDICARE | Attending: Foot & Ankle Surgery | Primary: Internal Medicine

## 2022-05-22 DIAGNOSIS — E11621 Type 2 diabetes mellitus with foot ulcer: Secondary | ICD-10-CM

## 2022-05-22 DIAGNOSIS — E1142 Type 2 diabetes mellitus with diabetic polyneuropathy: Secondary | ICD-10-CM

## 2022-05-27 NOTE — Progress Notes (Signed)
Dominic Stephens     HPI:  This is a 67 year old uncontrolled diabetic male that presents today for follow-up of bilateral sub 1st metatarsal head ulcerations.  Believes things are improving.  He denies any pain.  He has been applying Medihoney, Band-Aids and donut pads to offload the area.  He has found his old pair of customized offloading inserts.  They do have a 1st ray cutout.  He has returned to his diabetic shoe gear.  He is awaiting casting for new orthotics and extra-depth shoes at [facility].  No significant drainage. His last hemoglobin A1c was 7.4 down from 10.6.  No fevers or chills.  No other concerns at today's visit.    Review of systems: Negative except as stated above.     Allergies   Allergen Reactions    Methocarbamol Other (See Comments)    Penicillins Rash     Patient states he can take other medications in this family    Sulfa Antibiotics Myalgia    Sulfamethoxazole-Trimethoprim Myalgia        History reviewed. No pertinent past medical history.     Patient Active Problem List    Diagnosis Date Noted    Indwelling Foley catheter present 05/08/2022    Finger infection 05/08/2022    Primary hypertension 11/13/2021    Type 2 diabetes mellitus with hyperglycemia, without long-term current use of insulin (HCC) 11/07/2021    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    Bladder stones 12/22/2017    Renal stones 12/22/2017    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          Current Outpatient Medications:     atenolol (TENORMIN) 50 MG tablet, Take 1 tablet by mouth 2 times daily, Disp: 180 tablet, Rfl: 3    glimepiride (AMARYL) 2 MG tablet, Take 1 tablet by mouth 2 times daily (with meals), Disp: 180 tablet, Rfl: 1    blood glucose test strips (ONETOUCH ULTRA) strip, Test sugars once daily E11.65, Disp: 100 each, Rfl: 3    Blood Glucose Monitoring Suppl (ONE TOUCH ULTRA 2) w/Device KIT, Test sugars once daily E11.65, Disp: 1 kit, Rfl: 0    ONE TOUCH  ULTRASOFT LANCETS MISC, Test sugars once daily E11.65, Disp: 100 each, Rfl: 3     Past Surgical History:   Procedure Laterality Date    ORTHOPEDIC SURGERY      Lumbar discecomy 2001        Family History   Problem Relation Age of Onset    Hypertension Mother     Diabetes Father     Diabetes Mother         Social History     Tobacco Use    Smoking status: Never    Smokeless tobacco: Never        Vitals:    05/22/22 1424   BP: (!) 158/80          Physical Exam:  General: The patient is alert and oriented. NAD.  Psych: Please and cooperative with exam.  Vascular: Dorsalis pedis pulses are 2/4 bilaterally. Posterior tibial pulses are 2/4 bilaterally. Temperature gradient is warm to cool proximal to distal. There is mild edema to the lower extremities. Capillary refill is within normal limits.  Neuro: Sensation to light touch is absent with monofilament testing. No reproducible paraesthesias.   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 There is a reddish discoloration to the skin.  Skin is thin, shiny and atrophic.  There is significant hemorrhagic callus, but improved beneath the 1st metatarsal heads bilaterally.  Ulceration to the right 1st metatarsal head has fully healed.  On the left side, there is a full-thickness ulceration measuring 1 cm x 0.7 cm with 0.2 cm in depth.  There is a healthy granular tissue base.  No tunneling or probe to bone.  There is no erythema, drainage, warmth or swelling. No other open lesions, ulcerations, macerations or suspicious lesions.  Pedal hair growth is absent.  No ecchymosis.  MSK: Muscle strength, tone and bulk are normal for age.  He has a semi-rigid plantar flexed 1st metatarsal head bilaterally with decreased ankle range of motion in dorsiflexion.  Hammertoes of the lesser digits.        ASSESSMENT/PLAN:       ICD-10-CM    1. Diabetic polyneuropathy associated with type 2 diabetes mellitus (HCC)  E11.42       2. Plantar flexed metatarsal, unspecified laterality  M21.6X9       3.  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    On exam, debridement of the callus tissue to the right 1st metatarsal head reveals a fully healed wound.  On the left side, the ulceration was debrided down to and including the subcutaneous tissue with a 15. Blade and tissue Nipper revealing a full-thickness ulceration which has worsened since our initial exam is.  Hemostasis was achieved with compression and silver nitrate.  The wound was irrigated dressed with bacitracin, Xeroform and a dry sterile dressing.      At this point, his offloading diabetic insoles do not appear to be providing adequate offloading.  A PEG assist insole was fabricated with offloading beneath the 1st metatarsal head on the left side and placed within a postoperative shoe.  He will wear this at all times during ambulation.    We will switch from the Medihoney to Medihoney with Exufiber Ag followed by gauze and wrap.  He will changes daily.    We discussed diabetic foot care and ulceration prevention.  I encouraged close observation of the feet with daily foot checks.  The patient should avoid barefoot walking.  We discussed glycemic control as it pertains to the diabetic foot.      He was educated on the signs and symptoms of infection will contact the office with any questions or concerns or report to the emergency department.  He understands that he remains at high risk for continued or worsening ulceration, infection and limb loss.  He will return in 2 weeks for recheck or sooner if needed.  Patient understands and is satisfied with the plan moving forward.  He will contact the office with any questions or concerns.    Please note that portions of this document were created  using the M*Modal Fluency Direct dictation system.  Any inconsistencies or typographical errors may be the result of mis-transcription that persist in spite of proof-reading and should be addressed with the document creator.    Lelar Farewell Devoria Albe, DPM, FACFAS

## 2022-06-05 ENCOUNTER — Ambulatory Visit
Admit: 2022-06-05 | Discharge: 2022-06-05 | Payer: MEDICARE | Attending: Foot & Ankle Surgery | Primary: Internal Medicine

## 2022-06-05 DIAGNOSIS — E11621 Type 2 diabetes mellitus with foot ulcer: Secondary | ICD-10-CM

## 2022-06-05 DIAGNOSIS — E1142 Type 2 diabetes mellitus with diabetic polyneuropathy: Secondary | ICD-10-CM

## 2022-06-05 NOTE — Progress Notes (Signed)
Dominic Stephens     HPI:  This is a 67 year old uncontrolled diabetic male that presents today for follow-up of bilateral sub 1st metatarsal head ulcerations.  Right side remains well healed.  He believes the left side is improving.  He has switched to Medihoney with exufiber Ag with a dry bandage.  A PEG assist insole with postoperative shoe was demonstrated and dispensed at our last visit.  He is wearing this "most of the time".  He is unable to dried bus with this device which is reasonable.  He denies any pain.  There has been a little clear drainage.  No purulence.  No redness, swelling or pain.  He admits to significant numbness in his feet.  Is still awaiting casting for his new diabetic insoles and extra-depth shoes as he missed his prior scheduled appointment.  His last hemoglobin A1c is 7.4 down from 10.6.  No fevers or chills.  He presents today with his offloading shoe and old diabetic insole and shoe on the right side.  No new concerns.    Review of systems: Negative except as stated above.     Allergies   Allergen Reactions    Methocarbamol Other (See Comments)    Penicillins Rash     Patient states he can take other medications in this family    Sulfa Antibiotics Myalgia    Sulfamethoxazole-Trimethoprim Myalgia        History reviewed. No pertinent past medical history.     Patient Active Problem List    Diagnosis Date Noted    Indwelling Foley catheter present 05/08/2022    Finger infection 05/08/2022    Primary hypertension 11/13/2021    Type 2 diabetes mellitus with hyperglycemia, without long-term current use of insulin (HCC) 11/07/2021    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    Bladder stones 12/22/2017    Renal stones 12/22/2017    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          Current Outpatient Medications:     atenolol (TENORMIN) 50 MG tablet, Take 1 tablet by mouth 2 times daily, Disp: 180 tablet, Rfl: 3    glimepiride (AMARYL) 2 MG tablet, Take 1 tablet by  mouth 2 times daily (with meals), Disp: 180 tablet, Rfl: 1    blood glucose test strips (ONETOUCH ULTRA) strip, Test sugars once daily E11.65, Disp: 100 each, Rfl: 3    Blood Glucose Monitoring Suppl (ONE TOUCH ULTRA 2) w/Device KIT, Test sugars once daily E11.65, Disp: 1 kit, Rfl: 0    ONE TOUCH ULTRASOFT LANCETS MISC, Test sugars once daily E11.65, Disp: 100 each, Rfl: 3     Past Surgical History:   Procedure Laterality Date    ORTHOPEDIC SURGERY      Lumbar discecomy 2001        Family History   Problem Relation Age of Onset    Hypertension Mother     Diabetes Father     Diabetes Mother         Social History     Tobacco Use    Smoking status: Never    Smokeless tobacco: Never        Vitals:    06/05/22 1354   BP: (!) 140/66       Physical Exam:  General: The patient is alert and oriented. NAD.  Psych: Please and cooperative with exam.  Vascular: Dorsalis pedis pulses are 2/4 bilaterally. Posterior tibial pulses are 2/4 bilaterally. Temperature gradient is warm to  cool proximal to distal. There is mild edema to the lower extremities. Capillary refill is within normal limits.  Neuro: Sensation to light touch is absent with monofilament testing. No reproducible paraesthesias.   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 There is a reddish discoloration to the skin.  Skin is thin, shiny and atrophic.  Previous ulceration to the plantar right foot remains well healed.  On the left side, as demonstrated in the picture below, full-thickness ulceration remains.  This appears to have significantly reduced in size.  Some hemorrhagic callus in the periphery with an adherent yellow exudate base.  The wound measures 0.7 x 0.6 cm with 0.2 cm in depth post debridement.  There is no tunneling or probe to bone or joint capsule.  No surrounding warmth, erythema, fluctuance or swelling. No other open lesions, ulcerations, macerations or suspicious lesions.  Pedal hair growth is absent.  No ecchymosis.  MSK: Muscle strength, tone and  bulk are normal for age.  He has a semi-rigid plantar flexed 1st metatarsal head bilaterally with decreased ankle range of motion in dorsiflexion.  Hammertoes of the lesser digits.        ASSESSMENT/PLAN:       ICD-10-CM    1. Diabetic polyneuropathy associated with type 2 diabetes mellitus (HCC)  E11.42       2. Plantar flexed metatarsal, unspecified laterality  M21.6X9       3. 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    The ulceration to the submet 1 on the left foot is improving.  The wound does require debridement today and therefore, the ulceration was debrided with a 15. Blade and dermal curette down to and including the subcutaneous tissue until healthy bleeding granular base was obtained free from devitalized tissue.  Hemostasis was achieved with compression and silver nitrate.  The wound was irrigated dressed with bacitracin and a Mepilex adhesive border.      Ideally, PEG assist insole to be worn at all times during ambulation.      Continue with the Medihoney with Exufiber Ag followed by gauze and wrap.  He will changes daily after cleansing with a wound wash.  He will keep this dry and clean.    We discussed diabetic foot care and ulceration prevention.  I encouraged close observation of the feet with daily foot checks.  The patient should avoid barefoot walking.  We discussed glycemic control as it pertains to the diabetic foot.  Awaiting diabetic shoe gear.    He was educated on the signs and symptoms of infection will contact the office with any questions or concerns or report to the emergency department.  He understands that he remains at high risk for continued or worsening ulceration, infection and limb loss.  He will return in  2 weeks for recheck or sooner if needed.  Patient understands and is satisfied with the plan moving forward.  He will contact the office with any questions or concerns.    Please note that portions of this document were created using the M*Modal Fluency Direct dictation system.  Any inconsistencies or typographical errors may be the result of mis-transcription that persist in spite of proof-reading and should be  addressed with the document creator.    Pragya Lofaso Leo Rod, DPM, FACFAS

## 2022-06-09 ENCOUNTER — Inpatient Hospital Stay
Admit: 2022-06-09 | Discharge: 2022-06-13 | Disposition: A | Discharge status: Home / Self Care | Payer: MEDICARE | Admitting: Family Medicine

## 2022-06-09 ENCOUNTER — Emergency Department: Admit: 2022-06-09 | Payer: MEDICARE | Primary: Internal Medicine

## 2022-06-09 DIAGNOSIS — E11628 Type 2 diabetes mellitus with other skin complications: Secondary | ICD-10-CM

## 2022-06-09 DIAGNOSIS — L089 Local infection of the skin and subcutaneous tissue, unspecified: Secondary | ICD-10-CM

## 2022-06-09 DIAGNOSIS — E11621 Type 2 diabetes mellitus with foot ulcer: Secondary | ICD-10-CM

## 2022-06-09 LAB — CBC WITH AUTO DIFFERENTIAL
Basophils %: 0 %
Basophils Absolute: 0.1 10*3/uL (ref 0.0–0.1)
Eosinophils %: 1 %
Eosinophils Absolute: 0.1 10*3/uL (ref 0.0–0.5)
Hematocrit: 40.5 % (ref 40.1–51.0)
Hemoglobin: 13.8 g/dL (ref 13.7–17.5)
Immature Granulocytes %: 2 %
Immature Granulocytes Absolute: 0.3 10*3/uL — ABNORMAL HIGH (ref 0.00–0.03)
Lymphocytes Absolute: 1 10*3/uL — ABNORMAL LOW (ref 1.2–3.7)
Lymphocytes: 6 %
MCH: 31.5 PG (ref 25.6–32.2)
MCHC: 34.1 g/dL (ref 32.2–35.5)
MCV: 92.5 FL (ref 80.0–95.0)
MPV: 9.3 FL — ABNORMAL LOW (ref 9.4–12.4)
Monocytes %: 9 %
Monocytes Absolute: 1.4 10*3/uL — ABNORMAL HIGH (ref 0.2–0.8)
Neutrophils Absolute: 13.7 10*3/uL — ABNORMAL HIGH (ref 1.6–6.1)
Nucleated RBCs: 0 PER 100 WBC (ref 0–0.02)
Platelets: 177 10*3/uL (ref 150–400)
RBC: 4.38 M/uL — ABNORMAL LOW (ref 4.51–5.93)
RDW: 12.4 % (ref 11.6–14.4)
Seg Neutrophils: 82 %
WBC: 16.5 10*3/uL — ABNORMAL HIGH (ref 4.0–10.0)
nRBC: 0 10*3/uL

## 2022-06-09 LAB — PROCALCITONIN: Procalcitonin: 1.01 ng/mL — ABNORMAL HIGH (ref 0–0.49)

## 2022-06-09 LAB — COMPREHENSIVE METABOLIC PANEL
ALT: 26 U/L (ref 0–50)
AST: 35 U/L (ref 0–50)
Albumin/Globulin Ratio: 1.4 (ref 1.0–3.0)
Albumin: 4 g/dL (ref 3.5–5.2)
Alk Phosphatase: 90 U/L (ref 40–129)
Anion Gap: 18 mmol/L — ABNORMAL HIGH (ref 5–15)
BUN: 48 MG/DL — ABNORMAL HIGH (ref 8–23)
Bun/Cre Ratio: 23 — ABNORMAL HIGH (ref 12–20)
CO2: 20 mmol/L — ABNORMAL LOW (ref 22–29)
Calcium: 9.1 MG/DL (ref 8.8–10.2)
Chloride: 97 mmol/L — ABNORMAL LOW (ref 98–107)
Creatinine: 2.08 MG/DL — ABNORMAL HIGH (ref 0.67–1.17)
Est, Glom Filt Rate: 35 mL/min/{1.73_m2} — ABNORMAL LOW (ref >60)
Glucose: 220 mg/dL — ABNORMAL HIGH (ref 74–109)
Potassium: 4.5 mmol/L (ref 3.5–5.1)
Sodium: 135 mmol/L — ABNORMAL LOW (ref 136–145)
Total Bilirubin: 0.78 mg/dL (ref 0–1.20)
Total Protein: 6.9 g/dL (ref 6.4–8.3)

## 2022-06-09 LAB — POCT GLUCOSE: POC Glucose: 144 mg/dL

## 2022-06-09 LAB — URINALYSIS WITH REFLEX TO CULTURE
Bilirubin, Urine: NEGATIVE
Glucose, Ur: NORMAL mg/dL
Leukocyte Esterase, Urine: 500 uL — AB
Nitrite, Urine: NEGATIVE
Protein, Urine: 30 mg/dL — AB
Specific Gravity, UA: 1.016 (ref 1.008–1.030)
Urobilinogen, Urine: NORMAL EU/dL
pH, Urine: 5.5 (ref 5.0–8.0)

## 2022-06-09 LAB — LACTATE, SEPSIS: Lactic Acid, Sepsis: 0.8 MMOL/L (ref 0.5–2.0)

## 2022-06-09 LAB — C-REACTIVE PROTEIN: CRP: 15.2 mg/dL — ABNORMAL HIGH (ref 0–0.50)

## 2022-06-09 LAB — SEDIMENTATION RATE: Sed Rate: 84 MM/HR — ABNORMAL HIGH (ref 0–20)

## 2022-06-09 MED ORDER — SODIUM CHLORIDE 0.9 % IV SOLN (MINI-BAG)
0.9 | Freq: Three times a day (TID) | INTRAVENOUS | Status: DC
Start: 2022-06-09 — End: 2022-06-13
  Administered 2022-06-10 – 2022-06-13 (×11): 3375 mg via INTRAVENOUS

## 2022-06-09 MED ORDER — NORMAL SALINE FLUSH 0.9 % IV SOLN
0.9 % | INTRAVENOUS | Status: AC | PRN
Start: 2022-06-09 — End: 2022-06-13

## 2022-06-09 MED ORDER — DEXTROSE 10 % IV SOLN
10 | INTRAVENOUS | Status: DC | PRN
Start: 2022-06-09 — End: 2022-06-13

## 2022-06-09 MED ORDER — ONDANSETRON HCL 4 MG/2ML IJ SOLN
42 MG/2ML | Freq: Four times a day (QID) | INTRAMUSCULAR | Status: AC | PRN
Start: 2022-06-09 — End: 2022-06-13

## 2022-06-09 MED ORDER — DEXTROSE 10 % IV BOLUS
INTRAVENOUS | Status: DC | PRN
Start: 2022-06-09 — End: 2022-06-13

## 2022-06-09 MED ORDER — GLUCOSE 4 G PO CHEW
4 | ORAL | Status: DC | PRN
Start: 2022-06-09 — End: 2022-06-13

## 2022-06-09 MED ORDER — ACETAMINOPHEN 650 MG RE SUPP
650 | Freq: Four times a day (QID) | RECTAL | Status: DC | PRN
Start: 2022-06-09 — End: 2022-06-13

## 2022-06-09 MED ORDER — INSULIN LISPRO (1 UNIT DIAL) 100 UNIT/ML SC SOPN
100 UNIT/ML | Freq: Every evening | SUBCUTANEOUS | Status: DC
Start: 2022-06-09 — End: 2022-06-13

## 2022-06-09 MED ORDER — VANCOMYCIN INTERMITTENT DOSING (PLACEHOLDER)
INTRAVENOUS | Status: DC
Start: 2022-06-09 — End: 2022-06-13

## 2022-06-09 MED ORDER — VANCOMYCIN 2000 MG IN NS 500 ML (PREMIX) IVPB
Status: AC
Start: 2022-06-09 — End: 2022-06-09
  Administered 2022-06-09: 17:00:00 2000 mg via INTRAVENOUS

## 2022-06-09 MED ORDER — NORMAL SALINE FLUSH 0.9 % IV SOLN
0.9 | Freq: Two times a day (BID) | INTRAVENOUS | Status: DC
Start: 2022-06-09 — End: 2022-06-13
  Administered 2022-06-10 – 2022-06-13 (×8): 10 mL via INTRAVENOUS

## 2022-06-09 MED ORDER — POLYETHYLENE GLYCOL 3350 17 G PO PACK
17 g | Freq: Every day | ORAL | Status: AC | PRN
Start: 2022-06-09 — End: 2022-06-13

## 2022-06-09 MED ORDER — SODIUM CHLORIDE 0.9 % IV SOLN
0.9 | INTRAVENOUS | Status: AC
Start: 2022-06-09 — End: 2022-06-11
  Administered 2022-06-09 – 2022-06-10 (×2): via INTRAVENOUS

## 2022-06-09 MED ORDER — PIPERACILLIN SOD-TAZOBACTAM SO 3.375 (3-0.375) G IV SOLR
3.3753-0.375 (3-0.375) g | INTRAVENOUS | Status: AC
Start: 2022-06-09 — End: 2022-06-09
  Administered 2022-06-09: 17:00:00 3375 mg via INTRAVENOUS

## 2022-06-09 MED ORDER — ATENOLOL 25 MG PO TABS
25 | Freq: Two times a day (BID) | ORAL | Status: DC
Start: 2022-06-09 — End: 2022-06-13
  Administered 2022-06-10 – 2022-06-13 (×8): 50 mg via ORAL

## 2022-06-09 MED ORDER — SODIUM CHLORIDE 0.9 % IV BOLUS
0.9 | Freq: Once | INTRAVENOUS | Status: AC
Start: 2022-06-09 — End: 2022-06-09
  Administered 2022-06-09: 16:00:00 1000 mL via INTRAVENOUS

## 2022-06-09 MED ORDER — GLUCAGON HCL (DIAGNOSTIC) 1 MG IJ SOLR
1 | INTRAMUSCULAR | Status: DC | PRN
Start: 2022-06-09 — End: 2022-06-13

## 2022-06-09 MED ORDER — VANCOMYCIN INTERMITTENT DOSING (PLACEHOLDER)
Freq: Once | INTRAVENOUS | Status: DC
Start: 2022-06-09 — End: 2022-06-13

## 2022-06-09 MED ORDER — ACETAMINOPHEN 325 MG PO TABS
325 | Freq: Four times a day (QID) | ORAL | Status: DC | PRN
Start: 2022-06-09 — End: 2022-06-13

## 2022-06-09 MED ORDER — VANCOMYCIN 1500 MG NS 500 ML (PREMIX) IVPB
Freq: Two times a day (BID) | Status: DC
Start: 2022-06-09 — End: 2022-06-13
  Administered 2022-06-10 – 2022-06-13 (×7): 1500 mg via INTRAVENOUS

## 2022-06-09 MED ORDER — SODIUM CHLORIDE 0.9 % IV SOLN
0.9 % | INTRAVENOUS | Status: AC | PRN
Start: 2022-06-09 — End: 2022-06-13

## 2022-06-09 MED ORDER — HEPARIN SODIUM (PORCINE) 5000 UNIT/ML IJ SOLN
5000 UNIT/ML | Freq: Three times a day (TID) | INTRAMUSCULAR | Status: DC
Start: 2022-06-09 — End: 2022-06-13
  Administered 2022-06-09 – 2022-06-11 (×3): 5000 [IU] via SUBCUTANEOUS

## 2022-06-09 MED ORDER — INSULIN LISPRO (1 UNIT DIAL) 100 UNIT/ML SC SOPN
100 | Freq: Three times a day (TID) | SUBCUTANEOUS | Status: DC
Start: 2022-06-09 — End: 2022-06-13

## 2022-06-09 MED ORDER — ONDANSETRON 4 MG PO TBDP
4 MG | Freq: Three times a day (TID) | ORAL | Status: AC | PRN
Start: 2022-06-09 — End: 2022-06-13

## 2022-06-09 MED FILL — DEXTROSE 10 % IV SOLN: 10 % | INTRAVENOUS | Qty: 1000

## 2022-06-09 MED FILL — HEPARIN SODIUM (PORCINE) 5000 UNIT/ML IJ SOLN: 5000 UNIT/ML | INTRAMUSCULAR | Qty: 1

## 2022-06-09 MED FILL — VANCOMYCIN HCL 10 G IV SOLR: 10 g | INTRAVENOUS | Qty: 2000

## 2022-06-09 MED FILL — VANCOMYCIN HCL 10 G IV SOLR: 10 g | INTRAVENOUS | Qty: 1500

## 2022-06-09 MED FILL — BD POSIFLUSH 0.9 % IV SOLN: 0.9 % | INTRAVENOUS | Qty: 40

## 2022-06-09 MED FILL — SODIUM CHLORIDE 0.9 % IV SOLN: 0.9 % | INTRAVENOUS | Qty: 250

## 2022-06-09 MED FILL — HUMALOG KWIKPEN 100 UNIT/ML SC SOPN: 100 UNIT/ML | SUBCUTANEOUS | Qty: 3

## 2022-06-09 MED FILL — PIPERACILLIN SOD-TAZOBACTAM SO 3.375 (3-0.375) G IV SOLR: 3.375 (3-0.375) g | INTRAVENOUS | Qty: 3375

## 2022-06-09 MED FILL — SODIUM CHLORIDE 0.9 % IV SOLN: 0.9 % | INTRAVENOUS | Qty: 1000

## 2022-06-09 NOTE — Plan of Care (Signed)
Problem: Pain  Goal: Verbalizes/displays adequate comfort level or baseline comfort level  06/09/2022 2312 by Gwynneth Munson, LPN  Outcome: Progressing     Problem: Discharge Planning  Goal: Discharge to home or other facility with appropriate resources  06/09/2022 2312 by Gwynneth Munson, LPN  Outcome: Progressing

## 2022-06-09 NOTE — ED Notes (Signed)
Report called to 3S RN Pattricia Boss

## 2022-06-09 NOTE — H&P (Signed)
Dominic Stephens  08-Jul-1955  06/09/2022    History and Physical    Subjective:     History Present Illness  Dominic Stephens is a 67 y.o. 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 The patient states he had seen his podiatrist Dr Lawrence Marseilles 3 days ago, he then was cleaning and changing his dressing doing well until last yesterday he noted the redness, drainage, and swelling he stated he has had chills and sore throat URI symptoms prior to this and had been on ciprofloxacin from a prescription he had at home.  No changes in bowel or bladder habits no N/V/D, urinary Foley catheter changed while in ED today, draining yellow urine   Taken today in the ED 05/12    BP (!) 151/86   Pulse 81   Temp 98.9 F (37.2 C) (Oral)   Resp 18   Ht 1.956 m (6\' 5" )   Wt 127 kg (280 lb)   SpO2 94%   BMI 33.20 kg/m I reviewed the patient's labs as follows: metabolic panel indicates hyponatremia Na 135, hypoglycemia BS 220, dehydrated BUN 48 AKI 2.08, infectious process procalcitonin 1.01 CRP 15.20, leukocytosis WBC 16.5 neutrophils 13.7 sed rate 84 x-ray left foot no osteomyelitis, urinalysis +1 bacteria WBC 21-50 leukocytes 500    Pertinent history:  Reviewing Dr Ilean China notes on 05/08 he documented that the ulceration left foot ulceration submet 1 was improving however did require debridement until health bleeding granular base was obtained free from devitalized tissue, hemostasis achieved with compression and silver nitrate wound irrigated with bacitracin and Mepilex adhesive border.   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     Infection caused by Enterobacter cloacae     MRSA (methicillin resistant staph aureus) culture positive     Renal stones     Urinary bladder stone     Urinary catheter (Foley) change required     UTI (urinary tract infection)     UTI (urinary tract infection) due to urinary indwelling Foley catheter (HCC)     Wound cellulitis     diabetic      Past Surgical History:   Procedure Laterality Date    ORTHOPEDIC SURGERY      Lumbar discecomy 2001      Family History   Problem Relation Age of Onset    Hypertension Mother     Diabetes Father     Diabetes Mother       Social History     Tobacco Use    Smoking status: Never     Passive exposure: Past    Smokeless tobacco: Never   Substance Use Topics    Alcohol use: Not Currently      Prior to Admission medications    Medication Sig Start Date End Date Taking?  Authorizing Provider   atenolol (TENORMIN) 50 MG tablet Take 1 tablet by mouth 2 times daily 04/02/22  Yes Reape, Demetrios Isaacs, MD   glimepiride (AMARYL) 2 MG tablet Take 1 tablet by mouth 2 times daily (with meals) 11/28/21  Yes Reape, Demetrios Isaacs, MD   blood glucose test strips (ONETOUCH ULTRA) strip Test sugars once daily E11.65 11/07/21   Reape, Demetrios Isaacs, MD   Blood Glucose Monitoring Suppl (ONE TOUCH ULTRA 2) w/Device KIT Test sugars once daily E11.65 11/07/21   Reape, Demetrios Isaacs, MD   ONE TOUCH ULTRASOFT LANCETS MISC Test sugars once daily E11.65 11/07/21   Reape, Demetrios Isaacs, MD     Allergies   Allergen Reactions    Methocarbamol Other (See Comments)    Sulfa Antibiotics Myalgia    Sulfamethoxazole-Trimethoprim Myalgia     Review of Systems   Constitutional:  Positive for activity change, appetite change, chills and fatigue.   HENT:  Positive for sore throat.    Eyes: Negative.    Respiratory: Negative.     Cardiovascular: Negative.     Gastrointestinal: Negative.    Endocrine: Negative.    Genitourinary: Negative.    Musculoskeletal:  Positive for gait problem and myalgias.   Skin:  Positive for color change and wound.        See left foot photo    Neurological:  Positive for weakness.   Psychiatric/Behavioral: Negative.       Objective:     Physical Exam  Vitals and nursing note reviewed.   Constitutional:       General: He is awake.   HENT:      Head: Normocephalic.      Nose: Nose normal.      Mouth/Throat:      Mouth: Mucous membranes are moist.   Eyes:      General: No scleral icterus.     Conjunctiva/sclera: Conjunctivae normal.   Cardiovascular:      Rate and Rhythm: Normal rate and regular rhythm.      Pulses: Normal pulses.      Heart sounds: Normal heart sounds.   Pulmonary:      Effort: Pulmonary effort is normal.      Breath sounds: Normal breath sounds.   Abdominal:      General: Bowel sounds are normal. There is no distension.      Tenderness: There is no abdominal tenderness.   Genitourinary:     Comments: Urinary foley catheter changed while in ED  Musculoskeletal:         General: Normal range of motion.      Cervical back: Normal range of motion and neck supple.      Right lower leg: No edema.      Left lower leg: No edema.   Skin:     General: Skin is warm and dry.      Capillary Refill: Capillary refill takes less than 2 seconds.      Findings: Erythema present.      Comments: Left foot wound bottom of foot see photo    Neurological:      Mental Status: He is alert and oriented to person, place, and time. Mental status is at baseline.   Psychiatric:         Mood and Affect: Mood normal.         Behavior: Behavior normal. Behavior is cooperative.        Data Review:   XR FOOT LEFT (MIN 3 VIEWS)  Result Date: 06/09/2022  EXAM: XR FOOT LT MIN 3 V HISTORY: Medic foot infection rule out osteomyelitis COMPARISON: None. TECHNIQUE: Three views of the left foot FINDINGS: There is an ulcer on the plantar aspect of forefoot. There is  no fracture. There is minimal hallux valgus. Normal bone mineralization. No erosion. Joint spaces are preserved.     No radiographic evidence of osteomyelitis.      Recent Results (from the past 24 hour(s))   CBC with Auto Differential    Collection Time: 06/09/22 12:10 PM   Result Value Ref Range    WBC 16.5 (H) 4.0 - 10.0 K/uL    RBC 4.38 (L) 4.51 - 5.93 M/uL    Hemoglobin 13.8 13.7 - 17.5 g/dL    Hematocrit 16.1 09.6 - 51.0 %    MCV 92.5 80.0 - 95.0 FL    MCH 31.5 25.6 - 32.2 PG    MCHC 34.1 32.2 - 35.5 g/dL    RDW 04.5 40.9 - 81.1 %    Platelets 177 150 - 400 K/uL    MPV 9.3 (L) 9.4 - 12.4 FL    Nucleated RBCs 0.0 0 - 0.02 PER 100 WBC    nRBC 0.00 K/uL    Seg Neutrophils 82 %    Lymphocytes 6 %    Monocytes % 9 %    Eosinophils % 1 %    Basophils % 0 %    Immature Granulocytes % 2 %    Neutrophils Absolute 13.7 (H) 1.6 - 6.1 K/UL    Lymphocytes Absolute 1.0 (L) 1.2 - 3.7 K/UL    Monocytes Absolute 1.4 (H) 0.2 - 0.8 K/UL    Eosinophils Absolute 0.1 0.0 - 0.5 K/UL    Basophils Absolute 0.1 0.0 - 0.1 K/UL    Immature Granulocytes Absolute 0.3 (H) 0.00 - 0.03 K/UL    Differential Type AUTOMATED    Lactate, Sepsis    Collection Time: 06/09/22 12:10 PM   Result Value Ref Range    Lactic Acid, Sepsis 0.8 0.5 - 2.0 MMOL/L   Procalcitonin    Collection Time: 06/09/22 12:10 PM   Result Value Ref Range    Procalcitonin 1.01 (H) 0 - 0.49 ng/mL   Sedimentation Rate    Collection Time: 06/09/22 12:10 PM   Result Value Ref Range    Sed Rate 84 (H) 0 - 20 MM/HR   C-Reactive Protein    Collection Time: 06/09/22 12:10 PM   Result Value Ref Range    CRP 15.20 (H) 0 - 0.50 mg/dL   CMP    Collection Time: 06/09/22 12:10 PM   Result Value Ref Range    Sodium 135 (L) 136 - 145 mmol/L    Potassium 4.5 3.5 - 5.1 mmol/L    Chloride 97 (L) 98 - 107 mmol/L    CO2 20 (L) 22 - 29 mmol/L    Anion Gap 18 (H) 5 - 15 mmol/L    Glucose 220 (H) 74 - 109 mg/dL    BUN 48 (H) 8 - 23 MG/DL    Creatinine 9.14 (H) 0.67 - 1.17 MG/DL    Bun/Cre Ratio 23  (H) 12 - 20    Est, Glom Filt Rate 35 (L) >60 ml/min/1.78m2    Calcium 9.1 8.8 - 10.2 MG/DL    Total Bilirubin 7.82 0 - 1.20 mg/dL    ALT 26 0 - 50 U/L    AST 35 0 - 50 U/L    Alk Phosphatase 90 40 -  129 U/L    Total Protein 6.9 6.4 - 8.3 g/dL    Albumin 4.0 3.5 - 5.2 g/dL    Albumin/Globulin Ratio 1.4 1.0 - 3.0     Urinalysis with Reflex to Culture    Collection Time: 06/09/22 12:38 PM    Specimen: Urine   Result Value Ref Range    Color, UA YELLOW YELLOW    Appearance TURBID (A) CLEAR    Specific Gravity, UA 1.016 1.008 - 1.030    pH, Urine 5.5 5.0 - 8.0    Protein, Urine 30 (A) Negative mg/dL    Glucose, Ur NORMAL NORMAL mg/dL    Ketones, Urine TRACE (A) Negative mg/dL    Bilirubin, Urine Negative Negative    Blood, Urine 1+ (A) Negative    Urobilinogen, Urine NORMAL NORMAL EU/dL    Nitrite, Urine Negative Negative    Leukocyte Esterase, Urine 500 (A) Negative uL    RBC, UA 12-20 (A) 0 - 3 /hpf    WBC, UA 21-50 (A) 0 - 5 /hpf    BACTERIA, URINE 1+ (A) Negative /hpf    Epithelial Cells, UA FEW FEW /lpf      XR FOOT LEFT (MIN 3 VIEWS)  Narrative: EXAM:  XR FOOT LT MIN 3 V    HISTORY:  Medic foot infection rule out osteomyelitis    COMPARISON:  None.    TECHNIQUE:  Three views of the left foot    FINDINGS:  There is an ulcer on the plantar aspect of forefoot. There is no fracture. There is minimal hallux valgus. Normal bone mineralization. No erosion. Joint spaces are preserved.  Impression: No radiographic evidence of osteomyelitis.     Old records reviewed.   Assessment:     Principal Problem:    Diabetic foot infection (HCC)  Resolved Problems:    * No resolved hospital problems. *    Plan:   In summary, Dominic Stephens, is a 67 y.o. male patient being admitted with worsening diabetic foot wound failed outpatient therapy    1.Diabetic foot infection  Patient is not clinically septic, sepsis focused examined completed, leukocytosis, elevated CRP, sed rate,   The patient has been seen and is under the care of a  podiatrist for his foot wound with Podiatrist Dr Lawrence Marseilles who recently debrided the wound this past Thursday. Patient noted last yesterday the area around the wound there was increased swelling, erythema traveling up the leg with pain and drainage at the wound site.  Failed out patient treatment will admit patient to inpatient status with podiatry consult  Podiatrist Dr Nori Riis consulted  Increased risk of infection from the following: elevated BS, exposure to environmental contaminants, MRSA hx. The Patient's blood sugar is elevated, he states it has been ranging from 150's- to 200 when he has checked them at home. 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  Zosyn 3375 mg IV 1 dose in ED  Zosyn 3375 mg IV every 8 hours  Normal saline 100 mg hour  MRSA precautions  Wound care team    2. AKI  Acute kidney injury in the setting of dehydration from infection, hyperglycemia and poor intake  IV fluids 1 L  while in ED of normal saline infused  IV normal saline 100 mL/hour  Patient is not on any diuretics to hold  Metabolic panel in a.m.    3. Uncontrolled diabetes DM type 2  Complicated by both noncompliance has been "eating [employer]" and infectious process worsening blood sugar levels, and has only been on glipizide at home  Will hold glipizide at this time  Place patient on an insulin sliding scale lispro insulin sensitive level tier 1 sensitive level  Hypoglycemia protocol  Nutritional consult both on DM type 2 diet and wound healing   Diabetic education material   POC a.c. and HS  A1c    4. Hyponatremia  Due to uncontrolled diabetes and infectious process  IV fluids as stated in #2  Metabolic panel in a.m.    5.Colonized urinalysis with  MRSA  Chronic urinary catheter insertion due to chronic prostatitis, urinary retention, renal and bladder stones, BPH unable to remove Foley until urologist decides to do surgery per patient  While in ED the patient's urinary foley catheter was changed  MRSA precautions     6. Hypertension atenolol 50 mg BID    7. DVT prophylaxis Heparin 5000 units SC every 8 hours SCDs    8. Code status full code    The patient meets 3 Day inpatient stay criteria due to the severity of his cellulitis, failed out patient status, uncontrolled diabetes and AKI with electrolyte abnormality and complexity of care needed at this time anticipate at least 2 over night stay with podiatry intervention     The patient's medications, assessment, labs and plan of care was discussed with Dr. Anibal Henderson    Medication Reconciliation Documentation:     I have utilized all available immediate resources to obtain, update, or review the patient's current medications. Reviewed with patient, pharmacy, physician     Advanced Care Plan Documentation:    [x]  I confirmed that the patient's Advance Care Plan is present, code status is documented, or surrogate decision maker is listed in the patient's medical record.    []  The patient's Advance Care plan is not present because:     []  I confirmed today that the patient does not wish or was not able to name a surrogate decision maker or provide an Advance Care Plan.  []  Hospice care is currently being provided or has been provided this calendar year.    []  I did NOT confirm today the presence of an Advance Care Plan or surrogate decision maker documented within the patient's medical record.     This document was generated with the aid of voice recognition software. Please be aware that there may be inadvertent transcription errors not identified or corrected by the author  Signed By: Lorrene Reid, APRN - NP     Jun 09, 2022

## 2022-06-09 NOTE — ED Provider Notes (Cosign Needed)
67 year old male patient history of type 2 diabetes on glipizide who is poorly controlled presents today for evaluation of pain, swelling, redness of his left foot going up his left calf.  States that he saw his podiatrist 3 days ago for the callus on the bottom of his left foot that was doing well and then he noticed increasing pain redness and swelling.  He had chills over the last week associated with some URI symptoms that have resolved.  He did start taking ciprofloxacin for that as he had this prescription left over at home.  He denies recorded fevers.    The history is provided by the patient and medical records.           Medical History:  Current Problem List:   Patient Active Problem List   Diagnosis    Bladder stones    Renal stones    Urinary retention due to benign prostatic hyperplasia    Impotence of organic origin    Type 2 diabetes mellitus with hyperglycemia, without long-term current use of insulin (HCC)    Primary hypertension    Indwelling Foley catheter present    Finger infection       Past Medical History:  No past medical history on file.    Past Surgical History:   Procedure Laterality Date    ORTHOPEDIC SURGERY      Lumbar discecomy 2001       Social History     Socioeconomic History    Marital status: Single     Spouse name: Not on file    Number of children: Not on file    Years of education: Not on file    Highest education level: Not on file   Occupational History    Not on file   Tobacco Use    Smoking status: Never    Smokeless tobacco: Never   Substance and Sexual Activity    Alcohol use: Not on file    Drug use: Not on file    Sexual activity: Not on file   Other Topics Concern    Not on file   Social History Narrative    Not on file     Social Determinants of Health     Financial Resource Strain: Low Risk  (04/01/2022)    Overall Financial Resource Strain (CARDIA)     Difficulty of Paying Living Expenses: Not hard at all   Food Insecurity: No Food Insecurity (04/01/2022)    Hunger  Vital Sign     Worried About Running Out of Food in the Last Year: Never true     Ran Out of Food in the Last Year: Never true   Transportation Needs: Unknown (04/01/2022)    PRAPARE - Therapist, art (Medical): Not on file     Lack of Transportation (Non-Medical): No   Physical Activity: Not on file   Stress: Not on file   Social Connections: Not on file   Intimate Partner Violence: Not on file   Housing Stability: Unknown (04/01/2022)    Housing Stability Vital Sign     Unable to Pay for Housing in the Last Year: Not on file     Number of Places Lived in the Last Year: Not on file     Unstable Housing in the Last Year: No       Family History:  Family History   Problem Relation Age of Onset    Hypertension Mother  Diabetes Father     Diabetes Mother        Medications:  Patient medication list reviewed  This patient does not have an active medication from one of the medication groupers.    Allergies:    Methocarbamol, Sulfa antibiotics, and Sulfamethoxazole-trimethoprim    Review of Systems   Constitutional:  Positive for chills. Negative for activity change, appetite change, diaphoresis, fatigue, fever and unexpected weight change.   HENT:  Negative for congestion and sore throat.    Respiratory:  Negative for cough and shortness of breath.    Cardiovascular:  Negative for chest pain.   Gastrointestinal:  Negative for abdominal pain and nausea.   Endocrine: Negative for polyuria.   Genitourinary:  Negative for decreased urine volume and dysuria.   Musculoskeletal:  Positive for arthralgias, joint swelling and myalgias. Negative for gait problem.   Skin:  Positive for color change and wound. Negative for pallor and rash.   Neurological:  Negative for headaches.   Psychiatric/Behavioral:  Negative for confusion.          Vital Signs for this visit:  Vitals:    06/09/22 1138 06/09/22 1230   BP: (!) 157/77 (!) 151/86   Pulse: 77 81   Resp: 18 18   Temp: 98.9 F (37.2 C)    TempSrc: Oral     SpO2: 95% 94%   Weight: 127 kg (280 lb)    Height: 1.956 m (6\' 5" )          Physical Exam  Vitals and nursing note reviewed.   Constitutional:       General: He is not in acute distress.     Appearance: Normal appearance. He is obese. He is not toxic-appearing or diaphoretic.   HENT:      Head: Normocephalic and atraumatic.      Right Ear: External ear normal.      Left Ear: External ear normal.      Nose: Nose normal.      Mouth/Throat:      Mouth: Mucous membranes are moist.      Pharynx: Oropharynx is clear.   Eyes:      Extraocular Movements: Extraocular movements intact.   Cardiovascular:      Rate and Rhythm: Normal rate and regular rhythm.      Pulses: Normal pulses.   Pulmonary:      Effort: Pulmonary effort is normal. No respiratory distress.   Musculoskeletal:         General: Swelling and tenderness present. Normal range of motion.      Cervical back: Normal range of motion.      Comments: Please see attached photos   Skin:     General: Skin is warm.      Capillary Refill: Capillary refill takes less than 2 seconds.      Findings: Erythema present.   Neurological:      General: No focal deficit present.      Mental Status: He is alert and oriented to person, place, and time. Mental status is at baseline.      Gait: Gait normal.   Psychiatric:         Mood and Affect: Mood normal.                       Procedures      Medical Decision Making  67 year old male patient history of type 2 diabetes on glipizide who is poorly controlled presents today for evaluation  of pain, swelling, redness of his left foot going up his left calf.  States that he saw his podiatrist 3 days ago for the callus on the bottom of his left foot that was doing well and then he noticed increasing pain redness and swelling.  He had chills over the last week associated with some URI symptoms that have resolved.  He did start taking ciprofloxacin for that as he had this prescription left over at home.  He denies recorded fevers.  On  exam he has in no acute distress.  Vitals unremarkable.  He ambulates independently with a steady gait.  He has 2 areas of callus on the plantar aspect of the left foot a smaller 1 over the 5th metatarsal head and a larger 1 over the 1st metatarsal head.  There is edema erythema warmth and tenderness of the left foot with lymphangitic streaking up the medial left lower leg to the proximal calf.  He is tenderness throughout this area as well.  He has intact distal pulses and capillary refill.  There is no expressible purulent discharge.  We will get labs including lactate and cultures.  X-ray of the left foot to evaluate for osteomyelitis.  He will be started on vancomycin and Zosyn.  Given the large area of of cellulitis anticipate admission to the hospital.  Will reach out to Podiatry.  Patient also states he is due for a indwelling Foley catheter change as it is been about 1 month.  We will change catheter and send urine sample.    Amount and/or Complexity of Data Reviewed  Labs: ordered.  Radiology: ordered.    Risk  Prescription drug management.              Lab orders and findings that I have personally reviewed and interpreted during this visit.  Only abnormal values will be noted:  Abnormal Labs Reviewed   CBC WITH AUTO DIFFERENTIAL - Abnormal; Notable for the following components:       Result Value    WBC 16.5 (*)     RBC 4.38 (*)     MPV 9.3 (*)     Neutrophils Absolute 13.7 (*)     Lymphocytes Absolute 1.0 (*)     Monocytes Absolute 1.4 (*)     Immature Granulocytes Absolute 0.3 (*)     All other components within normal limits   PROCALCITONIN - Abnormal; Notable for the following components:    Procalcitonin 1.01 (*)     All other components within normal limits   SEDIMENTATION RATE - Abnormal; Notable for the following components:    Sed Rate 84 (*)     All other components within normal limits   C-REACTIVE PROTEIN - Abnormal; Notable for the following components:    CRP 15.20 (*)     All other  components within normal limits   URINALYSIS WITH REFLEX TO CULTURE - Abnormal; Notable for the following components:    Appearance TURBID (*)     Protein, Urine 30 (*)     Ketones, Urine TRACE (*)     Blood, Urine 1+ (*)     Leukocyte Esterase, Urine 500 (*)     RBC, UA 12-20 (*)     WBC, UA 21-50 (*)     BACTERIA, URINE 1+ (*)     All other components within normal limits   COMPREHENSIVE METABOLIC PANEL - Abnormal; Notable for the following components:    Sodium 135 (*)     Chloride  97 (*)     CO2 20 (*)     Anion Gap 18 (*)     Glucose 220 (*)     BUN 48 (*)     Creatinine 2.08 (*)     Bun/Cre Ratio 23 (*)     Est, Glom Filt Rate 35 (*)     All other components within normal limits       Recent radiology studies including this visit.  I have personally reviewed these studies and my personal interpretation, if available, is documented in the ED Course:  XR FOOT LEFT (MIN 3 VIEWS)   Final Result   No radiographic evidence of osteomyelitis.             Medications given in the ED:  Medications   sodium chloride 0.9 % bolus 1,000 mL (1,000 mLs IntraVENous New Bag 06/09/22 1214)   vancomycin (VANCOCIN) 2000 mg in 0.9% sodium chloride 500 mL IVPB (has no administration in time range)   piperacillin-tazobactam (ZOSYN) 3,375 mg in sodium chloride 0.9 % 100 mL IVPB (mini-bag) (3,375 mg IntraVENous New Bag 06/09/22 1255)       Diagnosis:  1. Diabetic foot infection (HCC)    2. Acute kidney injury [place])        ED Course:  ED Course as of 06/09/22 1259   Sun Jun 09, 2022   1229 WBC(!): 16.5 [KA]   1232 IMPRESSION:  No radiographic evidence of osteomyelitis.   [KA]   1257 Sed Rate(!): 84 [KA]   1257 CRP(!): 15.20 [KA]   1257 Creatinine(!): 2.08 [KA]   1257 Procalcitonin(!): 1.01 [KA]   1258 Patient will be admitted to hospitalist service.  Acute kidney injury, labs concerning for possible osteomyelitis in addition to diabetic foot infection.  Will receive IV fluids, antibiotics.  Podiatry will consult on the patient.  Will  reach out to hospitalist team. [KA]      ED Course User Index  [KA] Grant Ruts, PA           Please note that portions of this document were created using the M*Modal Fluency Direct dictation system.  Any inconsistencies or typographical errors may be the result of mis-transcription that persist in spite of proof-reading and should be addressed with the document creator.       Grant Ruts, [place]  06/09/22 1259

## 2022-06-09 NOTE — ED Notes (Signed)
Hospitalist and Supervisor paged for admission

## 2022-06-09 NOTE — Progress Notes (Signed)
TRANSFER - IN REPORT:    Verbal report received from Lauren RN on Dominic Stephens  being received from ER for routine progression of patient care      Report consisted of patient's Situation, Background, Assessment and   Recommendations(SBAR).     Information from the following report(s) Nurse Handoff Report, Index, ED Encounter Summary, ED SBAR, Adult Overview, Intake/Output, MAR, Recent Results, Med Rec Status, Cardiac Rhythm NSR, Alarm Parameters, Quality Measures, and Neuro Assessment was reviewed with the receiving nurse.    Opportunity for questions and clarification was provided.  Assessment completed upon patient's arrival to unit and care assumed.

## 2022-06-09 NOTE — Progress Notes (Signed)
Mobility: None with Supervision          [x]  Patient ambulated this shift    Pain: The patient is not having any pain.    Diet/Diet Advancement: current diet order appropriate    Output/Voiding: adequate    Discharge: anticipated date: TBD; barriers to discharge:  Pending hospital course, IV antibiotics, IV fluids.    Hourly Rounding Completed    Patient Safety Verified During Hourly Rounding:     [x]  Bedrails x2    [x]  Gripper Socks   [x]  Bed locked in low position    [x]  Call light within reach   [x]  All personal items within reach   [x]  Bed/chair/personal alarm engaged   [x]  Activity info/level of assist documented on white board in room    Significant Event(s) During Shift: Patient arrived to 3 south this shift. Patient remains on IV antibiotics and IV fluids. Plan of care continued and ongoing.

## 2022-06-10 ENCOUNTER — Inpatient Hospital Stay: Admit: 2022-06-10 | Payer: MEDICARE | Primary: Internal Medicine

## 2022-06-10 LAB — POCT GLUCOSE
POC Glucose: 112 mg/dL
POC Glucose: 113 mg/dL
POC Glucose: 221 mg/dL
POC Glucose: 154 mg/dL

## 2022-06-10 LAB — BASIC METABOLIC PANEL W/ REFLEX TO MG FOR LOW K
Anion Gap: 12 mmol/L (ref 5–15)
BUN: 32 MG/DL — ABNORMAL HIGH (ref 8–23)
Bun/Cre Ratio: 20 (ref 12–20)
CO2: 20 mmol/L — ABNORMAL LOW (ref 22–29)
Calcium: 8.5 MG/DL — ABNORMAL LOW (ref 8.8–10.2)
Chloride: 108 mmol/L — ABNORMAL HIGH (ref 98–107)
Creatinine: 1.57 MG/DL — ABNORMAL HIGH (ref 0.67–1.17)
Est, Glom Filt Rate: 49 mL/min/{1.73_m2} — ABNORMAL LOW (ref >60)
Glucose: 114 mg/dL — ABNORMAL HIGH (ref 74–109)
Potassium: 4 mmol/L (ref 3.5–5.1)
Sodium: 140 mmol/L (ref 136–145)

## 2022-06-10 LAB — CBC WITH AUTO DIFFERENTIAL
Basophils %: 0 %
Basophils Absolute: 0 10*3/uL (ref 0.0–0.1)
Eosinophils %: 2 %
Eosinophils Absolute: 0.2 10*3/uL (ref 0.0–0.5)
Hematocrit: 39.5 % — ABNORMAL LOW (ref 40.1–51.0)
Hemoglobin: 12.6 g/dL — ABNORMAL LOW (ref 13.7–17.5)
Immature Granulocytes %: 1 %
Immature Granulocytes Absolute: 0.1 10*3/uL — ABNORMAL HIGH (ref 0.00–0.03)
Lymphocytes Absolute: 1.4 10*3/uL (ref 1.2–3.7)
Lymphocytes: 12 %
MCH: 29.8 PG (ref 25.6–32.2)
MCHC: 31.9 g/dL — ABNORMAL LOW (ref 32.2–35.5)
MCV: 93.4 FL (ref 80.0–95.0)
MPV: 9.1 FL — ABNORMAL LOW (ref 9.4–12.4)
Monocytes %: 10 %
Monocytes Absolute: 1.1 10*3/uL — ABNORMAL HIGH (ref 0.2–0.8)
Neutrophils Absolute: 8.1 10*3/uL — ABNORMAL HIGH (ref 1.6–6.1)
Nucleated RBCs: 0 PER 100 WBC (ref 0–0.02)
Platelets: 174 10*3/uL (ref 150–400)
RBC: 4.23 M/uL — ABNORMAL LOW (ref 4.51–5.93)
RDW: 12.5 % (ref 11.6–14.4)
Seg Neutrophils: 75 %
WBC: 10.9 10*3/uL — ABNORMAL HIGH (ref 4.0–10.0)
nRBC: 0 10*3/uL

## 2022-06-10 LAB — HEMOGLOBIN A1C
Estimated Avg Glucose: 166 mg/dL
Hemoglobin A1C: 7.4 % — ABNORMAL HIGH (ref 4.0–6.0)

## 2022-06-10 LAB — D-DIMER, QUANTITATIVE: D-Dimer, Quant: 1.45 ug/ml(FEU) — ABNORMAL HIGH (ref <0.46)

## 2022-06-10 LAB — PROTIME-INR
INR: 1.2 — ABNORMAL HIGH (ref 0.9–1.1)
Protime: 15.5 s — ABNORMAL HIGH (ref 12.2–14.7)

## 2022-06-10 MED ORDER — LORAZEPAM 2 MG/ML IJ SOLN
2 | INTRAMUSCULAR | Status: AC
Start: 2022-06-10 — End: 2022-06-11

## 2022-06-10 MED ORDER — GADOBUTROL 1 MMOL/ML IV SOLN
1 | Freq: Once | INTRAVENOUS | Status: AC | PRN
Start: 2022-06-10 — End: 2022-06-10
  Administered 2022-06-10: 20:00:00 10 mL via INTRAVENOUS

## 2022-06-10 MED FILL — PIPERACILLIN SOD-TAZOBACTAM SO 3.375 (3-0.375) G IV SOLR: 3.375 (3-0.375) g | INTRAVENOUS | Qty: 3375

## 2022-06-10 MED FILL — ATENOLOL 25 MG PO TABS: 25 MG | ORAL | Qty: 2

## 2022-06-10 MED FILL — HEPARIN SODIUM (PORCINE) 5000 UNIT/ML IJ SOLN: 5000 UNIT/ML | INTRAMUSCULAR | Qty: 1

## 2022-06-10 MED FILL — SODIUM CHLORIDE 0.9 % IV SOLN: 0.9 % | INTRAVENOUS | Qty: 100

## 2022-06-10 MED FILL — VANCOMYCIN HCL 10 G IV SOLR: 10 g | INTRAVENOUS | Qty: 1500

## 2022-06-10 MED FILL — GADAVIST 1 MMOL/ML IV SOLN: 1 MMOL/ML | INTRAVENOUS | Qty: 10

## 2022-06-10 NOTE — Progress Notes (Addendum)
Mobility:                      []  Patient ambulated this shift, stand pivot to wheelchair     Pain: denies pain      Diet/Diet Advancement: adequate     Output/Voiding: chronic foley, draining    Hourly Rounding Completed:      Check 1: ;Check 2: ;Check 3: ;Check 4:  Check 5: ;Check 6:      Patient Safety Verified During Hourly Rounding:      [x]  Bedrails x2    [x]  Gripper Socks   [x]  Bed locked in low position    [x]  Call light within reach   [x]  All personal items within reach   [x]  Bed/chair/personal alarm engaged   [x]  Activity info/level of assist documented on white board in room     Significant Event(s) During Shift:     No significant events occurred during this shift  Standard safety precautions in place

## 2022-06-10 NOTE — Progress Notes (Signed)
Hospitalist Progress Note    NAME:  Dominic Stephens   DOB:   [birth year]   MRN:   [id number]   MRN:  male      Date/Time:  06/10/2022       Subjective:  Left foot diabetic wound with bulky dressing in place, seen by podiatry. Pain in left leg from mid-calf to mid-thigh intermittently. Chronic foley. No abdominal pain or N/V. Afebrile.       Review of Systems    Negative       Past Med History and Social history reviewed. No changes.   [x] Current Medication list and allergies reviewed*    Vitals:  BP 126/67   Pulse 67   Temp 98.1 F (36.7 C) (Oral)   Resp 19   Ht 1.956 m (6\' 5" )   Wt 127 kg (280 lb)   SpO2 95%   BMI 33.20 kg/m   Temp (24hrs), Avg:98.4 F (36.9 C), Min:98.1 F (36.7 C), Max:98.9 F (37.2 C)        Last 24hr Input/Output:    Intake/Output Summary (Last 24 hours) at 06/10/2022 1732  Last data filed at 06/10/2022 0947  Gross per 24 hour   Intake 10 ml   Output 4000 ml   Net -3990 ml        Physical Exam    Constitutional:       General: He is awake.   HENT:      Head: Normocephalic.      Nose: Nose normal.      Mouth/Throat:      Mouth: Mucous membranes are moist.   Eyes:      General: No scleral icterus.     Conjunctiva/sclera: Conjunctivae normal.   Cardiovascular:      Rate and Rhythm: Normal rate and regular rhythm.      Pulses: Normal pulses.      Heart sounds: Normal heart sounds.   Pulmonary:      Effort: Pulmonary effort is normal.      Breath sounds: Normal breath sounds.   Abdominal:      General: Bowel sounds are normal. There is no distension.      Tenderness: There is no abdominal tenderness.   Genitourinary:     Comments: Urinary foley catheter changed while in ED  Musculoskeletal:         General: Normal range of motion.      Cervical back: Normal range of motion and neck supple.      Right lower leg: No edema.      Left lower leg: No edema.   Skin:  Evaluation of the left lower extremity reveals palpable pedal pulses.  There is increased temperature to the left foot.  There remains a  full-thickness ulceration beneath the 1st metatarsal head which does probe to bone.  There is fluctuance concerning for abscess to the 1st metatarsal head.  Upon debridement, there is seropurulent drainage.  No identifiable abscess.  Wound measures approximately 1.4 cm x 1.4 cm with 0.8 cm in depth.  There is a probe to bone.  Exposed tendon within the wound bed.  Cellulitis appears to be improving from initial admission pictures, but remains to the left foot.  First MTPJ is warm and swollen.  There is minimal tenderness on palpation.  Sensation light touch is absent.  He has a plantar flexed 1st ray that is semi rigid.  He has hammertoes of all lesser digits.  No pain with 1st MPJ or IPJ range  of motion.  Neurological:      Mental Status: He is alert and oriented to person, place, and time. Mental status is at baseline.   Psychiatric:         Mood and Affect: Mood normal.         Behavior: Behavior normal. Behavior is cooperative.                 Lab Data Reviewed:  No components found for: "[facility]"    Recent Labs     06/09/22  1210 06/10/22  0447   INR  --  1.2*   NA 135* 140   K 4.5 4.0   CL 97* 108*   CO2 20* 20*   BUN 48* 32*   WBC 16.5* 10.9*   HGB 13.8 12.6*   HCT 40.5 39.5*   PLT 177 174        VL DUP LOWER EXTREMITY VENOUS LEFT  Narrative: EXAM:  VL DUPLEX LOWER EXT VENOUS LEFT    HISTORY:  LLE pain, elevated d dimer    COMPARISON:  None.    TECHNIQUE:  Ultrasound DVT study of the left lower extremity performed.  The Doppler portion is performed with both color and spectral Doppler.  This study is performed with and without compression.    FINDINGS:  Good blood flow is seen in the left common femoral, superficial femoral, and popliteal veins. Phasic waveforms are demonstrated. There is no evidence of deep venous thrombosis of the left calf veins.  Impression: No evidence of DVT in the left lower extremity    LEFT FOOT XRAY: IMPRESSION:  No radiographic evidence of osteomyelitis.    LEFT FOOT MRI:  IMPRESSION:  1. Deep soft tissue ulcer plantar aspect of the foot near the 1st metatarsal-phalangeal joint  2. Minimal marrow edema 1st metatarsal-phalangeal joint area likely related to arthritis/reactive.  3. Joint effusion in the 1st metatarsal-phalangeal joint the  4. Soft tissue edema likely related to infection/cellulitis. No large focal abscess    Assessment/Plan:    Principal Problem:    Diabetic foot infection (HCC)  Resolved Problems:    * No resolved hospital problems. *     Diabetic left foot infection with severe cellulitis and concern for possible OM  Patient is not clinically septic  Leukocytosis - resolved, elevated CRP, sed rate   The patient has been seen and is under the care of a podiatrist for his foot wound with Podiatrist Dr. Lawrence Marseilles who recently debrided the wound this past Thursday. Patient noted the day PTA the area around the wound there was increased swelling, erythema traveling up the leg with pain and drainage at the wound site.  Podiatry consulted, input appreciated, sent wound culture (pending) and rec MRI left foot which has been done as above with findings concerning for OM but unclear if diagnostic of OM or more with severe cellulitis/soft tissue infection - f/u podiatry input/recs  Continue IV Vancomycin and Zosyn  Continue Normal saline 100 mg hour  Blood cultures negative to date  MRSA contact precautions  D-dimer elevated with LLE Duplex US negative for DVT     2. AKI  Acute kidney injury in the setting of dehydration and infection, hyperglycemia and poor intake  Continue IV normal saline 100 mL/hour  Cr improved, monitor along with urine output     3. Diabetes DM type 2 with hyperglycemia   Complicated by both noncompliance has been "eating [employer]" and infectious process worsening blood sugar levels,  and has only been on glipizide at home  holding glipizide  insulin sliding scale  Hypoglycemia protocol  Nutritional consult both on DM type 2 diet and wound healing    Diabetic education material   POC a.c. and HS - trend improved  A1c 7.4     4. Hyponatremia  Due to uncontrolled diabetes and infectious process  IV fluids as stated in #2  resolved     5. 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 Hypertension, on atenolol 50 mg BID     7. DVT prophylaxis with Heparin 5000 units SC every 8 hours, SCDs     8. Code status full code  ___________________________________________________      ___________________________________________________          Hospitalist: Fredric Mare, MD     ___________________________________________________    *Medications reviewed:  Current Facility-Administered Medications   Medication Dose Route Frequency    LORazepam (ATIVAN) injection 1 mg  1 mg IntraVENous On Call    sodium chloride flush 0.9 % injection 5-40 mL  5-40 mL IntraVENous 2 times per day    sodium chloride flush 0.9 % injection 5-40 mL  5-40 mL IntraVENous PRN    0.9 % sodium chloride infusion   IntraVENous PRN    ondansetron (ZOFRAN-ODT) disintegrating tablet 4 mg  4 mg Oral Q8H PRN    Or    ondansetron (ZOFRAN) injection 4 mg  4 mg IntraVENous Q6H PRN    polyethylene glycol (GLYCOLAX) packet 17 g  17 g Oral Daily PRN    acetaminophen (TYLENOL) tablet 650 mg  650 mg Oral Q6H PRN    Or    acetaminophen (TYLENOL) suppository 650 mg  650 mg Rectal Q6H PRN    0.9 % sodium chloride infusion   IntraVENous Continuous    heparin (porcine) injection 5,000 Units  5,000 Units SubCUTAneous 3 times per day    piperacillin-tazobactam (ZOSYN) 3,375 mg in sodium chloride 0.9 % 100 mL IVPB (mini-bag)  3,375 mg IntraVENous Q8H    atenolol  (TENORMIN) tablet 50 mg  50 mg Oral BID    vancomycin (VANCOCIN) 1500 mg in sodium chloride 0.9% 500 mL IVPB  1,500 mg IntraVENous Q12H    vancomycin (VANCOCIN) intermittent dosing (placeholder)  1 each Other RX Placeholder    [START ON 06/11/2022] Vancomycin Trough at 06/11/22 1230  1 each Other Once    insulin lispro (1 Unit Dial) (HUMALOG/ADMELOG) pen 0-4 Units  0-4 Units SubCUTAneous TID WC    insulin lispro (1 Unit Dial) (HUMALOG/ADMELOG) pen 0-4 Units  0-4 Units SubCUTAneous Nightly    glucose chewable tablet 16 g  4 tablet Oral PRN    dextrose bolus 10% 125 mL  125 mL IntraVENous PRN    Or    dextrose bolus 10% 250 mL  250 mL IntraVENous PRN    glucagon injection 1 mg  1 mg SubCUTAneous PRN    dextrose 10 % infusion   IntraVENous Continuous PRN

## 2022-06-10 NOTE — Progress Notes (Signed)
Pt shared parts of his story. Supportive listening presence and prayer.

## 2022-06-10 NOTE — Care Coordination-Inpatient (Signed)
Attempted visits x3 to complete initial assessment. OTF. Will continue to follow for Dc planning.

## 2022-06-10 NOTE — Wound Image (Signed)
Wound consult requested re: left foot diabetic wound. Patient is followed by Dr. Lawrence Marseilles in Podiatry and was seen by him this morning. Per Dr. Lawrence Marseilles, wound care being performed by Podiatry. No wound consult needed at this time. May re-consult as needed for dressing recommendations.

## 2022-06-10 NOTE — Care Coordination-Inpatient (Signed)
Case Management IDT Rounding Note    Interdisciplinary rounds were held on 06/10/2022 for Dominic Stephens with the following team members: Care Management, Nursing, Physician, RN Charge Nurse, and Pharmacy    Tabletop Rounds held?: Yes    Attending provider: Fredric Mare, MD.    Plan of care was discussed:  Patient admitted with diabetic foot wound. Followed by podiatry. On IV ABX and fluids. Labs improving. MRI today at 3pm.     Planned discharge date: 48-72 hours     Current ADT Status: Inpatient.    Current LOS: 1 .    Discharge destination: To be determined.    Discharge Barriers: none identified.

## 2022-06-10 NOTE — Consults (Signed)
[facility], [place]  CLINICAL NUTRITION    NUTRITION ASSESSMENT   Type of Assessment:  [x]  Initial    []  Follow Up    Referral From:   [x]  MD    []   RN    []  Patient Request    []  Nutrition Screen     Reason for Referral:   [x]  MST    []  Pressure Injury/Skin Breakdown    []  Poor Appetite/PO     []  Unintentional weight loss  []  Request for Oral Nutrition Supplements   [] Diet Ed   [x]  Other: Diabetic foot ulcer    Current Diet Order: 45 gm CHO/meal; low fat, low chol/ 2gm Na   Food Allergies: No known food allergies     Anthropometrics:  Height: 195.6 cm (6\' 5" )      Admit Weight - Scale: 127 kg (280 lb)   Most Recent Weight - Scale: 127 kg (280 lb)   Body mass index is 33.2 kg/m.     Patient Vitals for the past 336 hrs:   Weight   06/09/22 1138 127 kg (280 lb)          IBW RANGE: 83.2-95.5kg  % IBW: 134%  UBW: Unknown (ranging from 267-290 per chart)   % UBW: 100%    Adjusted wt: 105kg  Weight History: Pt reports 20 lb weight loss however this is not consistent with weights available in chart. Pt with 9 lb weight loss since March 2024 however this also seems to be a higher weight than he has been recently. He did report weight loss last fall during hospitalization.    Wt Readings from Last 30 Encounters:   06/09/22 127 kg (280 lb)   06/05/22 128.8 kg (284 lb)   05/22/22 128.8 kg (284 lb)   05/10/22 128.8 kg (284 lb)   05/08/22 128.8 kg (284 lb)   04/22/22 131.7 kg (290 lb 6.4 oz)   04/17/22 131.1 kg (289 lb)   04/03/22 131.1 kg (289 lb)   04/01/22 130.2 kg (287 lb)   12/25/21 127.5 kg (281 lb)   11/07/21 121.1 kg (267 lb)   11/03/21 122.5 kg (270 lb)   04/26/19 (!) 139.7 kg (308 lb)       Daily Estimated Needs: based on []  Current wt   []   IBW   []  UBW   [x]  Adjusted wt  Calories (kcals):  3150-3675 30-35 kcal (diabetic foot ulcer)   Protein (g):  131-157 1.25-1.5 g    Fluid(ml):  3150 30 ml/kg      Pertinent Medications: Reviewed [x]      Labs:  Recent Labs     06/09/22  1210 06/10/22  0447   NA 135* 140    K 4.5 4.0   CL 97* 108*   CO2 20* 20*   BUN 48* 32*   CREATININE 2.08* 1.57*   CALCIUM 9.1 8.5*   GLUCOSE 220* 114*       Lab Results   Component Value Date/Time    LABA1C 7.4 (H) 06/10/2022 04:47 AM    LABA1C 10.6 (H) 10/31/2021 01:36 PM       Recent Labs     06/09/22  1640 06/09/22  2147 06/10/22  0740 06/10/22  1152   POCGLU 144 112 113 221        PMH: Reviewed [x]     Past Medical History:   Diagnosis Date    AKI (acute kidney injury) (HCC)     BPH (benign prostatic  hyperplasia)     Chronic prostatitis     DM type 2 (diabetes mellitus, type 2) (HCC)     Hematuria     HTN (hypertension)     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   No data found.    Feeding Route:  [x]  Oral  []  Enteral  []  Parenteral    Gastrointestinal: (per nursing flowsheets)  Last Bowel Movement: 06/09/22 (lge per pt)  GI Symptoms:      Chewing/Swallowing Issues: None reported    Skin Integrity:   []  WDL      []  Pressure Injury:     [x]  Other:  Left diabetic foot ulcer     Edema: (per nursing flowsheets)   Peripheral Vascular (WDL): Exceptions to WDL  Edema: Left lower extremity  LLE Edema: Trace    NUTRITION DIAGNOSIS:    Increased nutrient needs     PES:    Increased nutrient needs related to diabetic foot ulcer as evidenced by established nutrient guidelines     INTERVENTION & PLAN   Food and/or Nutrient Delivery  Recommend 75 gm CHO/meal; cardiac diet  Trial Juven BID    Nutrition Education  Discussed diet related to DM diet and cardiac diet restrictions. Pt would benefit from  reinforcement.  Discussed oral nutrition supplement and their benefits    Coordination of Nutrition Care  N/a      Assessment Summary: Dominic Stephens is a 67 y.o. admitted with left diabetic foot infection and AKI, followed by podiatry.     Met with pt today, reports good appetite and intake. Pt was upset regarding food choices on diet, diet education was provided. Pt would benefit from increased carbohydrate alotement given nutrition needs. Agreeable to Juven to promote wound healing.       Goal(s):  Intake > 75% meals  BS WNL  Intake >50% oral nutrition supplement twice a day    MONITORING & EVALUATION   Follow Up Date: 06/13/22    Priority Level of Care: [x]  High []  Moderate []  Low

## 2022-06-10 NOTE — Consults (Signed)
JERMARI BEGNAUD   Date:  06/10/22     Reason for Consult:  Left diabetic foot infection    HPI: BUNYAN MOLE is a 67 y.o. male with a past medical history most significant for uncontrolled type 2 diabetes with neuropathy on admission for a left diabetic foot infection.  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  The wound was debrided at this visit.  No acute signs of infection.  He was instructed to continue with the Medihoney, Exufiber Ag and dry dressing as well as his postoperative offloading shoe with PEG assist insole.  Reports going to the grocery store on Thursday and doing quite a bit of walking.  He believes that the PEG assist insole shifted causing increased pressure to the foot.  The next day developed significant pain in his foot and left leg.  Subsequently developed redness, swelling and increased pain.  He took some ciprofloxacin that he had left over at home and presented to the [facility] Emergency Department.  He was admitted to the hospitalist service.  Initiation of vancomycin and Zosyn.  Blood cultures negative.  No wound cultures were obtained.  Radiographs of the left foot failed to demonstrate any osteomyelitis.  His white count is trending down.  He is systemically stable and afebrile.  No fevers or chills.  He is anxious about possibility of an MRI as he is claustrophobic.  He is seen at bedside this morning resting comfortably.  He does have a history of abscess to this location about 4 years ago treated at [facility] requiring bedside incision and drainage and IV antibiotics.  No other concerns at today.      Review of systems:  Negative except as stated above.       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     Infection caused by Enterobacter cloacae     MRSA (methicillin resistant staph aureus) culture positive     Renal stones     Urinary bladder stone     Urinary catheter (Foley) change required     UTI (urinary tract infection)     UTI (urinary tract infection) due to urinary indwelling Foley catheter (HCC)     Wound cellulitis     diabetic        Past Surgical History:   Procedure Laterality Date    ORTHOPEDIC SURGERY  Lumbar discecomy 2001          Current Facility-Administered Medications:     LORazepam (ATIVAN) injection 1 mg, 1 mg, IntraVENous, On Call, Najjar, Majed Y, MD    sodium chloride flush 0.9 % injection 5-40 mL, 5-40 mL, IntraVENous, 2 times per day, Lorrene Reid, APRN - NP, 10 mL at 06/09/22 2147    sodium chloride flush 0.9 % injection 5-40 mL, 5-40 mL, IntraVENous, PRN, Lorrene Reid, APRN - NP    0.9 % sodium chloride infusion, , IntraVENous, PRN, Lorrene Reid, APRN - NP    ondansetron (ZOFRAN-ODT) disintegrating tablet 4 mg, 4 mg, Oral, Q8H PRN **OR** ondansetron (ZOFRAN) injection 4 mg, 4 mg, IntraVENous, Q6H PRN, Lorrene Reid, APRN - NP    polyethylene glycol (GLYCOLAX) packet 17 g, 17 g, Oral, Daily PRN, Lorrene Reid, APRN - NP    acetaminophen (TYLENOL) tablet 650 mg, 650 mg,  Oral, Q6H PRN **OR** acetaminophen (TYLENOL) suppository 650 mg, 650 mg, Rectal, Q6H PRN, Lorrene Reid, APRN - NP    0.9 % sodium chloride infusion, , IntraVENous, Continuous, Lorrene Reid, APRN - NP, Last Rate: 100 mL/hr at 06/10/22 0042, New Bag at 06/10/22 0042    heparin (porcine) injection 5,000 Units, 5,000 Units, SubCUTAneous, 3 times per day, Lorrene Reid, APRN - NP, 5,000 Units at 06/09/22 2147    piperacillin-tazobactam (ZOSYN) 3,375 mg in sodium chloride 0.9 % 100 mL IVPB (mini-bag), 3,375 mg, IntraVENous, Q8H, Lorrene Reid, APRN - NP, Last Rate: 25 mL/hr at 06/10/22 0400, 3,375 mg at 06/10/22 0400    atenolol (TENORMIN) tablet 50 mg, 50 mg, Oral, BID, Lorrene Reid, APRN - NP, 50 mg at 06/09/22 2146    vancomycin (VANCOCIN) 1500 mg in sodium chloride 0.9% 500 mL IVPB, 1,500 mg, IntraVENous, Q12H, Najjar, Reece Leader, MD, Stopped at 06/10/22 0215    vancomycin (VANCOCIN) intermittent dosing (placeholder), 1 each, Other, RX Placeholder, Najjar, Reece Leader, MD    [START ON 06/11/2022] Vancomycin Trough at 06/11/22 1230, 1 each, Other, Once, Najjar, Majed Y, MD    insulin lispro (1 Unit Dial) (HUMALOG/ADMELOG) pen 0-4 Units, 0-4 Units, SubCUTAneous, TID WC, Curtis, Waldemar Dickens, APRN - NP    insulin lispro (1 Unit Dial) (HUMALOG/ADMELOG) pen 0-4 Units, 0-4 Units, SubCUTAneous, Nightly, Lyda Jester, Waldemar Dickens, APRN - NP    glucose chewable tablet 16 g, 4 tablet, Oral, PRN, Lorrene Reid, APRN - NP    dextrose bolus 10% 125 mL, 125 mL, IntraVENous, PRN **OR** dextrose bolus 10% 250 mL, 250 mL, IntraVENous, PRN, Lorrene Reid, APRN - NP    glucagon injection 1 mg, 1 mg, SubCUTAneous, PRN, Lorrene Reid, APRN - NP    dextrose 10 % infusion, , IntraVENous, Continuous PRN, Lorrene Reid, APRN - NP     Family History   Problem Relation Age of Onset    Hypertension Mother     Diabetes Father     Diabetes Mother         Social History     Tobacco Use    Smoking status:  Never     Passive exposure: Past    Smokeless tobacco: Never   Vaping Use    Vaping Use: Never used   Substance Use Topics    Alcohol use: Not Currently    Drug use: Not Currently        Vitals:    06/10/22 0930   BP: 126/67  Pulse: 67   Resp: 19   Temp: 98.1 F (36.7 C)   SpO2: 95%        Physical Exam:  General:  He is alert and oriented.  Nontoxic appearing.  Psych:  Anxious, but cooperative with exam and pleasant.  Evaluation of the left lower extremity reveals palpable pedal pulses.  There is increased temperature to the left foot.  There remains a full-thickness ulceration beneath the 1st metatarsal head which does probe to bone.  There is fluctuance concerning for abscess to the 1st metatarsal head.  Upon debridement, there is seropurulent drainage.  No identifiable abscess.  Wound measures approximately 1.4 cm x 1.4 cm with 0.8 cm in depth.  There is a probe to bone.  Exposed tendon within the wound bed.  Cellulitis appears to be improving from initial admission pictures, but remains to the left foot.  First MTPJ is warm and swollen.  There is minimal tenderness on palpation.  Sensation light touch is absent.  He has a plantar flexed 1st ray that is semi rigid.  He has hammertoes of all lesser digits.  No pain with 1st MPJ or IPJ range of motion.    Radiographs:  XR FOOT LEFT (MIN 3 VIEWS)  Order: [id number]  Status: Final result       Visible to patient: No (not released)       Next appt: 06/19/2022 at 02:15 PM in Podiatry Marguerite Olea, DPM)    0 Result Notes  Details    Reading Physician Reading Date Result Priority   Deforest Hoyles, MD  [phone] 06/09/2022      Narrative & Impression  EXAM:  XR FOOT LT MIN 3 V     HISTORY:  Medic foot infection rule out osteomyelitis     COMPARISON:  None.     TECHNIQUE:  Three views of the left foot     FINDINGS:  There is an ulcer on the plantar aspect of forefoot. There is no fracture. There is minimal hallux valgus. Normal bone mineralization. No erosion.  Joint spaces are preserved.     IMPRESSION:  No radiographic evidence of osteomyelitis.              Specimen Collected: 06/09/22 12:23 EDT Last Resulted: 06/09/22 12:24 EDT             ASSESSMENT/PLAN:       ICD-10-CM    1. Diabetic foot infection (HCC)  E11.628     L08.9       2. Acute kidney injury (HCC)  N17.9          I have reviewed the patient's chart for all relevant clinical documentation, imaging and laboratory data.    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    Exam is highly concerning for underlying abscess.  The ulceration was debrided with a 15. Blade down to and including the subcutaneous tissue.  Deep wound cultures were obtained.  Flank abscess could not be identified.  Warmth, swelling and fluctuance remain to the plantar aspect of the 1st MTPJ.  Wound was irrigated dressed with mupirocin and a dry dressing with Kerlix wrap and a compressive Ace bandage.    Infection does appear to be improving clinically although there is still concern for abscess involving the 1st MTPJ plantarly.  I would recommend proceeding with an MRI  with and without contrast of his left foot.  He may need to be premedicated with his claustrophobia.    White count trending down.    -MRI left foot with and without contrast  -Podiatry will perform dressing changes. Reinforce prn bleeding.  -Continue IV vanc/zosyn. Follow up on wound culture  -NWB LLE    No emergent surgical intervention at this time.  Follow-up on MRI.  Patient understands that he remains at high risk for pedal amputation.  All questions were entertained and answered.  Patient understands and is satisfied with the plan moving forward.    Please note that portions of this document were created using the M*Modal Fluency Direct dictation system.  Any inconsistencies or typographical errors may be the  result of mis-transcription that persist in spite of proof-reading and should be addressed with the document creator.      Alysiah Suppa Devoria Albe, DPM, FACFAS

## 2022-06-11 LAB — CBC
Hematocrit: 38.1 % — ABNORMAL LOW (ref 40.1–51.0)
Hemoglobin: 12.5 g/dL — ABNORMAL LOW (ref 13.7–17.5)
MCH: 30.7 PG (ref 25.6–32.2)
MCHC: 32.8 g/dL (ref 32.2–35.5)
MCV: 93.6 FL (ref 80.0–95.0)
MPV: 9.4 FL (ref 9.4–12.4)
Nucleated RBCs: 0 PER 100 WBC (ref 0–0.02)
Platelets: 168 10*3/uL (ref 150–400)
RBC: 4.07 M/uL — ABNORMAL LOW (ref 4.51–5.93)
RDW: 12.3 % (ref 11.6–14.4)
WBC: 9.4 10*3/uL (ref 4.0–10.0)
nRBC: 0 10*3/uL

## 2022-06-11 LAB — POCT GLUCOSE
POC Glucose: 169 mg/dL
POC Glucose: 141 mg/dL
POC Glucose: 262 mg/dL
POC Glucose: 211 mg/dL

## 2022-06-11 LAB — BASIC METABOLIC PANEL
Anion Gap: 11 mmol/L (ref 5–15)
BUN/Creatinine Ratio: 16 (ref 12–20)
BUN: 24 MG/DL — ABNORMAL HIGH (ref 8–23)
CO2: 22 mmol/L (ref 22–29)
Calcium: 8.7 MG/DL — ABNORMAL LOW (ref 8.8–10.2)
Chloride: 108 mmol/L — ABNORMAL HIGH (ref 98–107)
Creatinine: 1.46 MG/DL — ABNORMAL HIGH (ref 0.67–1.17)
Est, Glom Filt Rate: 53 mL/min/{1.73_m2} — ABNORMAL LOW (ref >60)
Glucose: 133 mg/dL — ABNORMAL HIGH (ref 74–109)
Potassium: 4 mmol/L (ref 3.5–5.1)
Sodium: 141 mmol/L (ref 136–145)

## 2022-06-11 LAB — CREATININE: Creatinine: 1.33 MG/DL — ABNORMAL HIGH (ref 0.67–1.17)

## 2022-06-11 LAB — VANCOMYCIN LEVEL, TROUGH: Vancomycin Tr: 18.6 ug/mL — ABNORMAL HIGH (ref 5.0–10.0)

## 2022-06-11 MED ORDER — VANCOMYCIN INTERMITTENT DOSING (PLACEHOLDER)
Freq: Once | INTRAVENOUS | Status: AC
Start: 2022-06-11 — End: 2022-06-13

## 2022-06-11 MED FILL — PIPERACILLIN SOD-TAZOBACTAM SO 3.375 (3-0.375) G IV SOLR: 3.375 (3-0.375) g | INTRAVENOUS | Qty: 3375

## 2022-06-11 MED FILL — HEPARIN SODIUM (PORCINE) 5000 UNIT/ML IJ SOLN: 5000 UNIT/ML | INTRAMUSCULAR | Qty: 1

## 2022-06-11 MED FILL — VANCOMYCIN 1500 MG NS 500 ML (PREMIX) IVPB: Qty: 500

## 2022-06-11 MED FILL — ATENOLOL 25 MG PO TABS: 25 MG | ORAL | Qty: 2

## 2022-06-11 MED FILL — SODIUM CHLORIDE 0.9 % IV SOLN: 0.9 % | INTRAVENOUS | Qty: 100

## 2022-06-11 MED FILL — VANCOMYCIN HCL 10 G IV SOLR: 10 g | INTRAVENOUS | Qty: 1500

## 2022-06-11 NOTE — Care Coordination-Inpatient (Signed)
Case Management IDT Rounding Note    Interdisciplinary rounds were held on 06/11/2022 for Dominic Stephens with the following team members: Care Management, Nursing, Physician, RN Charge Nurse, and Pharmacy    Tabletop Rounds held?: Yes    Attending provider: Roma Schanz, MD.    Plan of care was discussed:  Patient admitted with diabetic foot wound. Followed by podiatry, may require draining. On IV ABX and fluids. Labs improving.    Planned discharge date: 48-72 hours     Current ADT Status: Inpatient.    Current LOS: 2 .    Discharge destination: Home without services.    Discharge Barriers: none identified.

## 2022-06-11 NOTE — Plan of Care (Addendum)
Rested quietly during the night.  Dressing intact to left foot, no additional breakthrough drng noted, Podiatry to change dressing. Cont with IV vanco and zosyn    Problem: Pain  Goal: Verbalizes/displays adequate comfort level or baseline comfort level  Outcome: Progressing     Problem: Discharge Planning  Goal: Discharge to home or other facility with appropriate resources  Outcome: Progressing

## 2022-06-11 NOTE — Progress Notes (Signed)
[facility]  Inpatient Physical Therapy Evaluation    Patient: Dominic Stephens (67 y.o. male)                        DOB: 05-Jun-1955   Room Number: 311/01                                     Date: 06/11/22   Start Time:  Time In: 1130  End Time:  Time Out: 1220     Diagnosis:  Principal Problem:    Diabetic foot infection (HCC)  Resolved Problems:    * No resolved hospital problems. 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     Infection caused by Enterobacter cloacae     MRSA (methicillin resistant staph aureus) culture positive     Renal stones     Urinary bladder stone     Urinary catheter (Foley) change required     UTI (urinary tract infection)     UTI (urinary tract infection) due to urinary indwelling Foley catheter (HCC)     Wound cellulitis     diabetic     Past Surgical History:   Procedure Laterality Date    ORTHOPEDIC SURGERY      Lumbar discecomy 2001       Restrictions and Precautions  Restrictions/Precautions: Fall Risk, Weight Bearing, Contact Precautions     Left Lower Extremity Weight Bearing: Non Weight Bearing          Subjective:  Subjective: "I've been walking around here putting weight on just my heel. there's no way I can not put any weight on this foot."    Social/Functional History:  Lives With: Family  Type of Home: House  Home Layout: Multi-level  Home Access: Stairs to enter with rails  Entrance Stairs - Number of Steps: 12  Bathroom Shower/Tub: Medical sales representative: Standard  Home Equipment: None  Has the patient had two or more falls in the past year or any fall with injury in the past year?: No  Receives Help From: Family  ADL Assistance: Independent  Homemaking Assistance: Independent  Ambulation Assistance: Independent  Transfer  Assistance: Surveyor, minerals: Yes  Occupation: Full time employment  Type of Occupation: city bus driver    Objective data     Pain and Vital Signs:  Pain Assessment:   Subjective: "I don't have any pain in the foot because I can't feel anything."  Pain: no c/o L foot pain, however pt does have R knee pain (5/10) that has been getting progressively worse            Vital Signs:          Activity Tolerance/Endurance:  Activity Tolerance  Activity Tolerance: Patient tolerated evaluation without incident;Patient limited by endurance;Treatment limited secondary to medical complications (pt unable to maintain NWB LLE in standing)       General Assessment (from flowsheets; blank = not applicable or not assessed)     Vision:     Auditory:  Orientation and Cognition:   Orientation  Overall Orientation Status: Within Functional Limits    Cognition  Overall Cognitive Status: Exceptions  Arousal/Alertness: Appropriate responses to stimuli  Following Commands: Follows multistep commands consistently  Safety Judgement: Decreased awareness of need for assistance;Decreased awareness of need for safety  Problem Solving: Assistance required to generate solutions  Insights: Impaired   Observations:           General Extremity Gross Assessment:   Gross Assessment  AROM: Generally decreased, functional  Strength: Generally decreased, functional  Sensation: Impaired (decreased sensation to B feet up to mid shin)     Detailed Extremity Assessment: (IF Empty = not applicable or not assessed)   Range of Motion:   Right Extremity ROM (degrees) Left  Extremity ROM (degrees)                     Spine ROM:       Joint Mobility:      Strength:   Right Extremity Strength Left Extremity Strength                   Strength Other Comment: grossly 3- to 3/5 throughout all 4 extremities;       Quality of Movement:  including tone, coordination, proprioception                Mobility Assessment   Balance:  Balance  Sitting - Static:  Good  Sitting - Dynamic: Good  Standing - Static: Fair (NWB LLE)  Standing - Dynamic: Fair;- (NWB LLE)   Bed Mobility:  Bed mobility  Supine to Sit: Modified independent (bedrail)  Scooting: Stand by assistance   Transfers:  Transfers  Sit to Stand: Moderate Assistance (mod A x 1 to rW)  Stand to Sit: Moderate Assistance (mod A x 1)   Ambulation:  Ambulation  WB Status: NWB LLE   Wheelchair activity:        PT Exercises:       Goals   Goal Summary:  Patient Goals:  Patient Goals : to go home and go back to work    Short Term Goals:  Short Term Goal 1: independent bed mobility without bed features  Short Term Goal 2: supervised sit --> stand to [employer]  Short Term Goal 3: supervised amb household distances < 100' with LRAD, maintaining NWB LLE  Short Term Goal 4: standby assist up 8 stairs with rail, NWB LLE    Long Term Goals:        Assessment and Recommendations   Performance Deficits:  Decreased functional mobility , Decreased ADL status, Decreased strength, Decreased ROM, Decreased tolerance to work activity, Decreased safe awareness, Decreased endurance, Decreased sensation, Decreased balance    Assessment:  Pt adm to hospital with L foot wound infection. PT met with pt for eval, pt stating to PT that he has been walking in the room on his heel and that he told the Dr this morning "and he seemed okay with that." PT sent message to Podiatrist [facility]) to confirm that pt should be NWB LLB. Podiatry did confirm that pt should be NWB LLE "as much as possible". PT educated pt on WB status and need to attempt to mobilize with PT while maintaining NWB. Pt expressed concerned about having to be NWB on LLE secondary to R knee pain and generalized decreased upper body strength. Pt was mod A x 1 for sit --> stand from elevated bed height to RW, unable to maintain  NWB LLE through transfer. + anterior LOB requiring assist to correct, once pt regain balance he was able to lift L foot off ground and maintain static standing  balance at RW. Attempted sidestep to [facility] however pt unable to move R foot while keeping L foot off the ground. Pt with many questions about hospital stay as he is anxious to return to work. Given pt's inability to maintain NWB LLE, a flight of stairs to enter his house and that he is functioning well below baseline currently, he is not cleared for home. PT will continue to follow and assist with d/c recommendations. CM/RN aware.     Therapy Prognosis:  Good    Discharge Recommendations:  Based on therapist evaluation of mobility and function:    []  Patient IS recommended for discharge to home/prior location (assisted living, LTC).         []  For successful discharge home, the following supports are recommended:   []  Home services:    []  Adaptive equipment/DME:    []  Supervision/assist during mobility    []  Family/caregiver education   []  Other:     [x]  Patient IS NOT recommended for discharge to home/prior location (assisted living, LTC).         [x]  The following factors currently limit the patient from home discharge:   [x]  Not yet medically optimized   []  Inadequate pain control   []  Unstable/abnormal vital signs during mobility   [x]  Gait instability/poor balance/inability to transfer  [x]  Increased fall risk     []  Cognitive status affecting safety awareness   [x]  Limited social supports/assistance at home  [x]  Architectural barriers in the home  [x]  Needs to increase mobility with floor staff until discharge  []  Other:     [x]  At this time, it is felt that patient needs cannot be safely met in the home setting; will recommend facility-based rehab (STR).      Plan of Care   Frequency/Duration:    3 times/week    Therapy will continue until one or more of the following occurs:  - Patient is discharged from acute care hospital setting  - Patient meets all therapy goals and no longer requires therapy services  - There is a change in medical status that would necessitate holding or discharging therapy  services.    Treatment Recommendations:  ROM, Strengthening, Balance training, Functional mobility training, Transfer training, Endurance training, Gait training, Stair training, Neuromuscular re-education, Pain management, Return to work related activity, Home exercise program, Safety education & training       Equipment Recommendations:   Equipment Needed: Yes     Care Plan discussed with:   [x] Patient   [] Family   [x] Case Manager/Social Work   [x] Nursing    [] Physician/PA/NP      Patient Field seismologist   Patient Education:  Education Given To: Patient  Education Provided: Role of Therapy, Plan of Care, Home Exercise Program, Precautions, Teacher, early years/pre, Equipment (WB status)  Education Method: Industrial/product designer, Demonstration  Barriers to Learning: None  Education Outcome: Continued education needed     Safety:  Type of Devices: Call light within reach, Patient at risk for falls, Left in bed, Nurse notified (RN aware and okay'd pt to sit EOB for lunch)            Modified Rankin Score (mRS):    *CVA and TIA patients only *     Score Description   []  0 No symptoms at all   []  1  No significant disability despite symptoms; able to carry out all usual duties and activities   []  2 Slight disability; unable to carry out all previous activities but able to look after own affairs without assistance   []  3 Moderate disability; requiring some help (e.g with shopping/managing affairs) but able to walk without assistance.    []  4 Moderately severe disability, unable to walk without assistance and unable to attend to own bodily needs without assistance.    []  5 Severe disability; bedridden, incontinent and requiring constant nursing care and attention.    []  6 Dead     [facility] AM-PACTM '6 Clicks'    Basic Mobility Inpatient Short Form   Please check the box that reflects your best answer to each question. (If the patient hasn't done an activity recently, how much help from another person do you think he/she  would need if he/she tried?)   How much difficulty does the patient currently have. Total A Lot A Little None   1. Turning over in bed (including adjusting bedclothes, sheets and blankets)?  [] 1 [] 2 [] 3 [x] 4   2. Sitting down on and standing up from a chair with arms (e.g., wheelchair, bedside commode, etc.)  [] 1 [x] 2 [] 3 [] 4   3. Moving from lying on back to sitting on the side of the bed [] 1 [] 2 [x] 3 [] 4   How much help from another person does the patient currently need. Total A Lot A Little None   4. Moving to and from a bed to a chair (including a wheelchair)? [] 1 [x] 2 [] 3 [] 4   5. To walk in hospital room?  [] 1 [x] 2 [] 3 [] 4   6. Climbing 3-5 steps with a railing? [x] 1 [] 2 [] 3 [] 4   AM-PAC Short Form Manual (v. 3.0)  2016, Trustees of [address], under license to [place], [place]. All rights reserved.   Interpretation of Tool: Represents clinically-significant functional categories (i.e.Activities of daily living).   Raw Score: 14    Hero Kulish A Tanyiah Laurich, PT  06/11/22

## 2022-06-11 NOTE — Progress Notes (Signed)
Hospitalist Progress Note    NAME:  COLBI MULRY   DOB:   10-Apr-1955   MRN:   [id number]     Date of Service: 06/11/2022      Subjective:   Feels leg is a lot better no pain or discomfort      Assessment/Plan:     67 year old male with history of diabetes type 2 admitted with diabetic foot infection.      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 antibiotics   Nonweightbearing status to left lower extremity   Dressing changes per Podiatry     AKI:   Improved with IV fluids     Two diabetes hyperglycemia:   Glipizide on hold   Continue sliding scale insulin monitor blood sugars q.a.c. q.h.s.    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:   On antibiotics per above likely colonized          Objective:   Vitals:  BP (!) 148/67   Pulse 60   Temp 98.5 F (36.9 C) (Oral)   Resp 18   Ht 1.956 m (6\' 5" )   Wt 127 kg (280 lb)   SpO2 91%   BMI 33.20 kg/m         Temp (24hrs), Avg:98.3 F (36.8 C), Min:97.8 F (36.6 C), Max:98.5 F (36.9 C)      Last 24hr Input/Output:    Date 06/11/22 0000 - 06/11/22 2359   Shift 0000-0759 0800-1559 1600-2359 24 Hour Total   INTAKE   Shift Total(mL/kg)       OUTPUT   Urine(mL/kg/hr) 1800(1.8) 1400(1.4)  3200   Shift Total(mL/kg) 1800(14.2) 1400(11)  3200(25.2)   Weight (kg) 127 127 127 127          PHYSICAL EXAM:    Physical Exam  Constitutional:       Appearance: Normal appearance.   HENT:      Head: Normocephalic and atraumatic.   Cardiovascular:      Rate and Rhythm: Normal rate.      Heart sounds: No murmur heard.  Pulmonary:      Effort: No respiratory distress.      Breath sounds: No wheezing or rales.   Musculoskeletal:      Comments: Left foot dressed             Lab Data Reviewed:    No components found for: "[facility]"     Recent Labs     06/09/22  1210 06/10/22  0447  06/11/22  0648   WBC 16.5* 10.9* 9.4   HGB 13.8 12.6* 12.5*   HCT 40.5 39.5* 38.1*   PLT 177 174 168        Recent Labs     06/09/22  1210 06/10/22  0447 06/11/22  0648   INR  --  1.2*  --    NA 135* 140 141   K 4.5 4.0 4.0   CL 97* 108* 108*   CO2 20* 20* 22   BUN 48* 32* 24*   ALT 26  --   --         Recent Labs     06/10/22  0447   INR 1.2*           No results found for: "CKTOTAL", "CKMB", "CKMBINDEX", "TROPONINI"  No results found for: "CHOL", "CHLST", "CHOLV", "HDL", "HDLC", "LDL"       MRI FOOT LEFT W WO CONTRAST    Result Date: 06/10/2022  EXAM: MRI FOOT LT W WO CONT HISTORY: Concern for abscess/osteomyelitis left 1st ray COMPARISON: Plain film from Jun 09, 2022 TECHNIQUE: Axial T1, stir, proton with fat sat, T1 pre and postcontrast with fat sat, sagittal T1, sagittal STIR, coronal T1, coronal STIR, coronal T1 postcontrast. The current study concentrates on the forefoot FINDINGS: There is deep soft tissue ulceration on the plantar aspect of the foot near the 1st metatarsal-phalangeal joint. The ulcer extends close to the tibial sesamoid. There is minimal marrow edema in the sesamoid, head of the 1st metatarsal and base of the 1st proximal phalanx. There is a  joint effusion in the 1st metatarsal-phalangeal joint. There is soft tissue edema and soft tissue enhancement near the 1st metatarsal-phalangeal joint. There is no large soft tissue fluid to the suggests large abscess. There is soft tissue prominence on the plantar aspect of the foot near the 5th metatarsal phalangeal joint. This is likely related to degenerative changes. There is no evidence of fracture or dislocation.     1. Deep soft tissue ulcer plantar aspect of the foot near the 1st metatarsal-phalangeal joint 2. Minimal marrow edema 1st metatarsal-phalangeal joint area likely related to arthritis/reactive. 3. Joint effusion in the 1st metatarsal-phalangeal joint the 4. Soft tissue edema likely related to infection/cellulitis. No large focal  abscess     VL DUP LOWER EXTREMITY VENOUS LEFT    Result Date: 06/10/2022  EXAM: VL DUPLEX LOWER EXT VENOUS LEFT HISTORY: LLE pain, elevated d dimer COMPARISON: None. TECHNIQUE: Ultrasound DVT study of the left lower extremity performed.  The Doppler portion is performed with both color and spectral Doppler.  This study is performed with and without compression. FINDINGS: Good blood flow is seen in the left common femoral, superficial femoral, and popliteal veins. Phasic waveforms are demonstrated. There is no evidence of deep venous thrombosis of the left calf veins.     No evidence of DVT in the left lower extremity              ___________________________________________________    Hospitalist: Roma Schanz, MD     ___________________________________________________

## 2022-06-11 NOTE — Progress Notes (Signed)
SpO2 WNL on room air for over 24 hours; D/C RT follow-up.

## 2022-06-11 NOTE — Progress Notes (Signed)
Mobility:                      [x]  Patient ambulated this shift, up to bathroom.  Left foot non weight bearing     Pain: denies pain      Diet/Diet Advancement: adequate     Output/Voiding: voiding, foley    Discharge: anticipated date:  ; barriers to discharge:      Hourly Rounding Completed:      Check 1: ;Check 2: ;Check 3: ;Check 4:  Check 5: ;Check 6:      Patient Safety Verified During Hourly Rounding:      [x]  Bedrails x2    [x]  Gripper Socks   [x]  Bed locked in low position    [x]  Call light within reach   [x]  All personal items within reach   [x]  Bed/chair/personal alarm engaged   [x]  Activity info/level of assist documented on white board in room     Significant Event(s) During Shift:     No significant events occurred during this shift

## 2022-06-11 NOTE — Care Coordination-Inpatient (Signed)
Case Management Initial Assessment:    Met with Dominic Stephens who is being treated for Acute kidney injury [facility]) [N17.9]  Diabetic foot infection (HCC) [Z61.096, L08.9].  Introduced the role of the Sports coach. Provided contact information for case manager.      Dominic Stephens is currently Inpatient.     Encouraged Dominic Stephens to contact RN/SW Case Manager with any questions or concerns related to the discharge plan.       IMM completed with date and time confirmed Yes     Potential Barriers to successful discharge :  none    DISCHARGE DISPOSITION:  Home with OP f/u     Insurance authorization needed for STR: YES / NO: Yes     06/11/22 0913   Service Assessment   Patient Orientation Alert and Oriented   Cognition Alert   History Provided By Patient   Primary Caregiver Self   Support Systems Friends/Neighbors   Patient's Healthcare Decision Maker is: Legal Next of Kin  (Discussed ACP planning, not interested at this time.)   PCP Verified by CM Yes   Last Visit to PCP Within last 3 months   Prior Functional Level Independent in ADLs/IADLs   Current Functional Level Independent in ADLs/IADLs   Can patient return to prior living arrangement Yes   Ability to make needs known: Good   Family able to assist with home care needs: Yes   Would you like for me to discuss the discharge plan with any other family members/significant others, and if so, who? No   Psychologist, occupational None   Social/Functional History   Lives With Family   Type of Home House   Home Layout Multi-level   Home Access Stairs to enter with rails   Entrance Stairs - Number of Steps 12   Bathroom Shower/Tub Tub/Shower unit   Home Equipment None   ADL Assistance Independent   Mudlogger Yes   Occupational hygienist Yes   Mode of Engineer, water   Occupation Full time employment   Discharge Planning   Type of Residence House   Living  Arrangements Friends   Current Services Prior To Admission None   Potential Assistance Needed N/A   DME Ordered? No   Potential Assistance Purchasing Medications No   Type of Home Care Services None   Patient expects to be discharged to: House   Follow Up Appointment: Best Day/Time  Monday PM   History of falls? 0

## 2022-06-11 NOTE — Progress Notes (Signed)
Dominic Stephens   Date:  06/10/22     HPI: Dominic Stephens is a 67 y.o. male with a past medical history most significant for uncontrolled type 2 diabetes with neuropathy on admission for a left diabetic foot infection.  He completed an MRI of the left foot with and without contrast as well as a venous duplex ultrasound of the left lower extremity which was negative for DVT.  He has noted improvement in the pain.  No fevers or chills.  No overnight events.  He has some concerns about returning to work.  No other concerns at today's visit.    Review of systems:  Negative except as stated above.       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     Infection caused by Enterobacter cloacae     MRSA (methicillin resistant staph aureus) culture positive     Renal stones     Urinary bladder stone     Urinary catheter (Foley) change required     UTI (urinary tract infection)     UTI (urinary tract infection) due to urinary indwelling Foley catheter (HCC)     Wound cellulitis     diabetic        Past Surgical History:   Procedure Laterality Date    ORTHOPEDIC SURGERY       Lumbar discecomy 2001          Current Facility-Administered Medications:     LORazepam (ATIVAN) injection 1 mg, 1 mg, IntraVENous, On Call, Najjar, Majed Y, MD    sodium chloride flush 0.9 % injection 5-40 mL, 5-40 mL, IntraVENous, 2 times per day, Lorrene Reid, APRN - NP, 10 mL at 06/09/22 2147    sodium chloride flush 0.9 % injection 5-40 mL, 5-40 mL, IntraVENous, PRN, Lorrene Reid, APRN - NP    0.9 % sodium chloride infusion, , IntraVENous, PRN, Lorrene Reid, APRN - NP    ondansetron (ZOFRAN-ODT) disintegrating tablet 4 mg, 4 mg, Oral, Q8H PRN **OR** ondansetron (ZOFRAN) injection 4 mg, 4 mg, IntraVENous, Q6H PRN, Lorrene Reid, APRN - NP    polyethylene glycol (GLYCOLAX) packet 17 g, 17 g, Oral, Daily PRN, Lorrene Reid, APRN - NP    acetaminophen (TYLENOL) tablet 650 mg, 650 mg, Oral, Q6H PRN **OR** acetaminophen (TYLENOL) suppository 650 mg, 650 mg, Rectal, Q6H PRN, Lorrene Reid, APRN - NP    0.9 % sodium chloride infusion, ,  IntraVENous, Continuous, Lorrene Reid, APRN - NP, Last Rate: 100 mL/hr at 06/10/22 0042, New Bag at 06/10/22 0042    heparin (porcine) injection 5,000 Units, 5,000 Units, SubCUTAneous, 3 times per day, Lorrene Reid, APRN - NP, 5,000 Units at 06/09/22 2147    piperacillin-tazobactam (ZOSYN) 3,375 mg in sodium chloride 0.9 % 100 mL IVPB (mini-bag), 3,375 mg, IntraVENous, Q8H, Lorrene Reid, APRN - NP, Last Rate: 25 mL/hr at 06/10/22 0400, 3,375 mg at 06/10/22 0400    atenolol (TENORMIN) tablet 50 mg, 50 mg, Oral, BID, Lorrene Reid, APRN - NP, 50 mg at 06/09/22 2146    vancomycin (VANCOCIN) 1500 mg in sodium chloride 0.9% 500 mL IVPB, 1,500 mg, IntraVENous, Q12H, Najjar, Reece Leader, MD, Stopped at 06/10/22 0215    vancomycin (VANCOCIN) intermittent dosing (placeholder), 1 each, Other, RX Placeholder, Najjar, Reece Leader, MD    [START ON 06/11/2022] Vancomycin Trough at 06/11/22 1230, 1 each, Other, Once, Najjar, Majed Y, MD     insulin lispro (1 Unit Dial) (HUMALOG/ADMELOG) pen 0-4 Units, 0-4 Units, SubCUTAneous, TID WC, Curtis, Waldemar Dickens, APRN - NP    insulin lispro (1 Unit Dial) (HUMALOG/ADMELOG) pen 0-4 Units, 0-4 Units, SubCUTAneous, Nightly, Lyda Jester, Waldemar Dickens, APRN - NP    glucose chewable tablet 16 g, 4 tablet, Oral, PRN, Lorrene Reid, APRN - NP    dextrose bolus 10% 125 mL, 125 mL, IntraVENous, PRN **OR** dextrose bolus 10% 250 mL, 250 mL, IntraVENous, PRN, Lorrene Reid, APRN - NP    glucagon injection 1 mg, 1 mg, SubCUTAneous, PRN, Lorrene Reid, APRN - NP    dextrose 10 % infusion, , IntraVENous, Continuous PRN, Lorrene Reid, APRN - NP     Family History   Problem Relation Age of Onset    Hypertension Mother     Diabetes Father     Diabetes Mother         Social History     Tobacco Use    Smoking status: Never     Passive exposure: Past    Smokeless tobacco: Never   Vaping Use    Vaping Use: Never used   Substance Use Topics    Alcohol use: Not Currently    Drug use: Not Currently        Vitals:    06/10/22 0930   BP: 126/67   Pulse: 67   Resp: 19   Temp: 98.1 F (36.7 C)   SpO2: 95%        Physical Exam:  General:  He is alert and oriented.  Nontoxic appearing.  Psych:  Cooperative with exam and pleasant.  Pedal pulses are palpable.  Sensation light touch is absent.  Cellulitis to the left lower extremity has dramatically improved.  The right great toe and 1st MTPJ remains cellulitic as demonstrated in the picture below.  There is increased warmth and mild swelling.  No fluctuance dorsally.  Plantar wound remains full-thickness with some deep tunneling measuring approximately 1.3 cm x 1.2 cm in diameter with 0.4 cm in depth upon probing today.  No significant hyperkeratotic tissue in the periphery.  Wound is hemostatic.  No further drainage expressed with compression of adjacent soft tissue structures.  He has semi-rigid hammertoes of all lesser digits.  No pain with 1st MPJ or IPJ range of motion  of the left great toe.                Radiographs:  XR FOOT LEFT (MIN  3 VIEWS)  Order: [id number]  Status: Final result       Visible to patient: No (not released)       Next appt: 06/19/2022 at 02:15 PM in Podiatry Marguerite Olea, DPM)    0 Result Notes  Details    Reading Physician Reading Date Result Priority   Deforest Hoyles, MD  [phone] 06/09/2022      Narrative & Impression  EXAM:  XR FOOT LT MIN 3 V     HISTORY:  Medic foot infection rule out osteomyelitis     COMPARISON:  None.     TECHNIQUE:  Three views of the left foot     FINDINGS:  There is an ulcer on the plantar aspect of forefoot. There is no fracture. There is minimal hallux valgus. Normal bone mineralization. No erosion. Joint spaces are preserved.     IMPRESSION:  No radiographic evidence of osteomyelitis.              Specimen Collected: 06/09/22 12:23 EDT Last Resulted: 06/09/22 12:24 EDT           MRI:  MRI FOOT LEFT W WO CONTRAST  Order: [id number]  Status: Final result       Visible to patient: No (not released)       Next appt: 06/19/2022 at 02:15 PM in Podiatry Marguerite Olea, DPM)    0 Result Notes  Details    Reading Physician Reading Date Result Priority   Tilda Franco, MD  [phone] 06/10/2022      Narrative & Impression  EXAM:  MRI FOOT LT W WO CONT     HISTORY:  Concern for abscess/osteomyelitis left 1st ray     COMPARISON:  Plain film from Jun 09, 2022     TECHNIQUE:  Axial T1, stir, proton with fat sat, T1 pre and postcontrast with fat sat, sagittal T1, sagittal STIR, coronal T1, coronal STIR, coronal T1 postcontrast. The current study concentrates on the forefoot     FINDINGS:  There is deep soft tissue ulceration on the plantar aspect of the foot near the 1st metatarsal-phalangeal joint. The ulcer extends close to the tibial sesamoid. There is minimal marrow edema in the sesamoid, head of the 1st metatarsal and base of the 1st proximal phalanx. There is a  joint effusion in the 1st metatarsal-phalangeal joint. There is  soft tissue edema and soft tissue enhancement near the 1st metatarsal-phalangeal joint. There is no large soft tissue fluid to the suggests large abscess.     There is soft tissue prominence on the plantar aspect of the foot near the 5th metatarsal phalangeal joint. This is likely related to degenerative changes.     There is no evidence of fracture or dislocation.     IMPRESSION:  1. Deep soft tissue ulcer plantar aspect of the foot near the 1st metatarsal-phalangeal joint  2. Minimal marrow edema 1st metatarsal-phalangeal joint area likely related to arthritis/reactive.  3. Joint effusion in the 1st metatarsal-phalangeal joint the  4. Soft tissue edema likely related to infection/cellulitis. No large focal abscess              Specimen Collected: 06/10/22 17:38 EDT Last Resulted: 06/10/22 17:49 EDT             ASSESSMENT/PLAN:       ICD-10-CM    1. Diabetic foot infection (HCC)  E11.628     L08.9       2. Acute kidney injury (HCC)  N17.9  I have reviewed the patient's chart for all new relevant clinical documentation, imaging and laboratory data.    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    I have reviewed the patient's MRI.  Ulceration demonstrated to the plantar aspect of the 1st MTPJ.  Effusion is appreciated of the 1st MTPJ extending dorsally.  There is some mild marrow edema of the 1st MTPJ and tibial sesamoid which is favored to be more arthritic or reactive versus osteomyelitis.  No concordance on T1 imaging.  There is no fluid collection or abscess plantarly.    Venous duplex negative for DVT.  Deep wound culture is pending.  White count normal.    I would recommend continue with broad-spectrum antibiotics and follow up on culture data.  Continue with vancomycin and Zosyn.  He will require a few more days of IV antibiotics with transition  to p.o. when culture data is available.  This was discussed with the patient.  No surgical intervention planned at this time.     Nonweightbearing left lower extremity would be beneficial.  PT eval placed in    Wound irrigated and cleansed.  Dressed with [employer], Adaptic and a dry sterile dressing.  Secured with a Ace bandage.  Podiatry will perform dressing changes.  Reinforce as needed.    Patient understands that he remains at high risk for pedal amputation.  All questions were entertained and answered.  Patient understands and is satisfied with the plan moving forward.    Please note that portions of this document were created using the M*Modal Fluency Direct dictation system.  Any inconsistencies or typographical errors may be the result of mis-transcription that persist in spite of proof-reading and should be addressed with the document creator.      Tasman Zapata Devoria Albe, DPM, FACFAS

## 2022-06-12 LAB — POCT GLUCOSE
POC Glucose: 168 mg/dL
POC Glucose: 159 mg/dL
POC Glucose: 181 mg/dL
POC Glucose: 167 mg/dL

## 2022-06-12 LAB — CULTURE, URINE: Culture: <10000 — AB

## 2022-06-12 LAB — CULTURE, WOUND
Culture: NO GROWTH
Gram Stain: NONE SEEN

## 2022-06-12 MED FILL — SODIUM CHLORIDE 0.9 % IV SOLN: 0.9 % | INTRAVENOUS | Qty: 100

## 2022-06-12 MED FILL — HEPARIN SODIUM (PORCINE) 5000 UNIT/ML IJ SOLN: 5000 UNIT/ML | INTRAMUSCULAR | Qty: 1

## 2022-06-12 MED FILL — ATENOLOL 25 MG PO TABS: 25 MG | ORAL | Qty: 2

## 2022-06-12 MED FILL — VANCOMYCIN 1500 MG NS 500 ML (PREMIX) IVPB: Qty: 500

## 2022-06-12 MED FILL — PIPERACILLIN SOD-TAZOBACTAM SO 3.375 (3-0.375) G IV SOLR: 3.375 (3-0.375) g | INTRAVENOUS | Qty: 3375

## 2022-06-12 MED FILL — VANCOMYCIN HCL 10 G IV SOLR: 10 g | INTRAVENOUS | Qty: 1500

## 2022-06-12 NOTE — Progress Notes (Signed)
Dominic Stephens   Date:  06/12/22     HPI: Dominic Stephens is a 67 y.o. male with a past medical history most significant for uncontrolled type 2 diabetes with neuropathy on admission for a left diabetic foot infection.      Worked with PT yesterday. Unable to be NWB LLE. He has an offloading PEG assist with him. No overnight events. No f/c. He has concerns about returning to work as soon as possible as he cannot afford to pay his bills.     Review of systems:  Negative except as stated above.       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     Infection caused by Enterobacter cloacae     MRSA (methicillin resistant staph aureus) culture positive     Renal stones     Urinary bladder stone     Urinary catheter (Foley) change required     UTI (urinary tract infection)     UTI (urinary tract infection) due to urinary indwelling Foley catheter (HCC)     Wound cellulitis     diabetic        Past Surgical History:   Procedure Laterality Date    ORTHOPEDIC SURGERY      Lumbar discecomy 2001          Current Facility-Administered Medications:     [START ON 06/13/2022]  vancomycin trough reminder, 1 each, Other, Once, Chander, Amit, MD    sodium chloride flush 0.9 % injection 5-40 mL, 5-40 mL, IntraVENous, 2 times per day, Lorrene Reid, APRN - NP, 10 mL at 06/12/22 0156    sodium chloride flush 0.9 % injection 5-40 mL, 5-40 mL, IntraVENous, PRN, Lorrene Reid, APRN - NP    0.9 % sodium chloride infusion, , IntraVENous, PRN, Lorrene Reid, APRN - NP    ondansetron (ZOFRAN-ODT) disintegrating tablet 4 mg, 4 mg, Oral, Q8H PRN **OR** ondansetron (ZOFRAN) injection 4 mg, 4 mg, IntraVENous, Q6H PRN, Lorrene Reid, APRN - NP    polyethylene glycol (GLYCOLAX) packet 17 g, 17 g, Oral, Daily PRN, Lorrene Reid, APRN - NP    acetaminophen (TYLENOL) tablet 650 mg, 650 mg, Oral, Q6H PRN **OR** acetaminophen (TYLENOL) suppository 650 mg, 650 mg, Rectal, Q6H PRN, Lorrene Reid, APRN - NP    heparin (porcine) injection 5,000 Units, 5,000 Units, SubCUTAneous, 3 times per day, Lorrene Reid, APRN - NP, 5,000 Units at 06/11/22  1449    piperacillin-tazobactam (ZOSYN) 3,375 mg in sodium chloride 0.9 % 100 mL IVPB (mini-bag), 3,375 mg, IntraVENous, Q8H, Lorrene Reid, APRN - NP, Last Rate: 25 mL/hr at 06/12/22 0442, 3,375 mg at 06/12/22 0442    atenolol (TENORMIN) tablet 50 mg, 50 mg, Oral, BID, Lorrene Reid, APRN - NP, 50 mg at 06/11/22 2128    vancomycin (VANCOCIN) 1500 mg in sodium chloride 0.9% 500 mL IVPB, 1,500 mg, IntraVENous, Q12H, Najjar, Reece Leader, MD, Stopped at 06/12/22 0318    vancomycin (VANCOCIN) intermittent dosing (placeholder), 1 each, Other, RX Placeholder, Najjar, Reece Leader, MD    Vancomycin Trough at 06/11/22 1230, 1 each, Other, Once, Najjar, Majed Y, MD    insulin lispro (1 Unit Dial) (HUMALOG/ADMELOG) pen 0-4 Units, 0-4 Units, SubCUTAneous, TID WC, Curtis, Waldemar Dickens, APRN - NP    insulin lispro (1 Unit Dial) (HUMALOG/ADMELOG) pen 0-4 Units, 0-4 Units, SubCUTAneous, Nightly, Lyda Jester, Waldemar Dickens, APRN - NP    glucose chewable  tablet 16 g, 4 tablet, Oral, PRN, Lorrene Reid, APRN - NP    dextrose bolus 10% 125 mL, 125 mL, IntraVENous, PRN **OR** dextrose bolus 10% 250 mL, 250 mL, IntraVENous, PRN, Lorrene Reid, APRN - NP    glucagon injection 1 mg, 1 mg, SubCUTAneous, PRN, Lorrene Reid, APRN - NP    dextrose 10 % infusion, , IntraVENous, Continuous PRN, Lorrene Reid, APRN - NP     Family History   Problem Relation Age of Onset    Hypertension Mother     Diabetes Father     Diabetes Mother         Social History     Tobacco Use    Smoking status: Never     Passive exposure: Past    Smokeless tobacco: Never   Vaping Use    Vaping Use: Never used   Substance Use Topics    Alcohol use: Not Currently    Drug use: Not Currently        Vitals:    06/12/22 0445   BP: 138/80   Pulse: 59   Resp: 18   Temp: 97.8 F (36.6 C)   SpO2: 93%        Physical Exam:  General:  He is alert and oriented.  Nontoxic appearing.  Psych:  Cooperative with exam and pleasant.  Pedal pulses are palpable.  Sensation light touch is absent.  Cellulitis to the left lower extremity improved.  The right great toe and 1st MTPJ remain moderately cellulitic.  Is no fluctuance or warmth along the dorsal aspect of the joint.  There continues to be soft tissue swelling plantarly.  No purulence or drainage expressed with compression of adjacent soft tissue structures.  He has a deep probing wound within the central aspect of his ulceration measuring 1.3 cm x 1.2 cm with 0.6 cm in probing.  It does probe near bone.  No significant hyperkeratotic tissue in the periphery.  Wound is hemostatic.  Plantar flexed 1st ray.  He has semi-rigid hammertoes of all lesser digits.  No pain with 1st MPJ or IPJ range of motion of the left great toe.    Radiographs:  XR FOOT LEFT (MIN 3 VIEWS)  Order: [id number]  Status: Final result       Visible to patient: No (not released)       Next appt: 06/19/2022 at 02:15 PM in Podiatry (Desarea [place] C Lakashia Collison, DPM)    0 Result  Notes  Details    Reading Physician Reading Date Result Priority   Deforest Hoyles, MD  [phone] 06/09/2022      Narrative & Impression  EXAM:  XR FOOT LT MIN 3 V     HISTORY:  Medic foot infection rule out osteomyelitis     COMPARISON:  None.     TECHNIQUE:  Three views of the left foot     FINDINGS:  There is an ulcer on the plantar aspect of forefoot. There is no fracture. There is minimal hallux valgus. Normal bone mineralization. No erosion. Joint spaces are preserved.     IMPRESSION:  No radiographic evidence of osteomyelitis.              Specimen Collected: 06/09/22 12:23 EDT Last Resulted: 06/09/22 12:24 EDT           MRI:  MRI FOOT LEFT W WO CONTRAST  Order: [id number]  Status: Final result       Visible to patient: No (not released)       Next appt: 06/19/2022 at 02:15 PM in Podiatry Marguerite Olea, DPM)    0 Result Notes  Details    Reading Physician Reading Date Result Priority   Tilda Franco, MD  [phone] 06/10/2022      Narrative & Impression  EXAM:  MRI FOOT LT W WO CONT     HISTORY:  Concern for abscess/osteomyelitis left 1st ray     COMPARISON:  Plain film from Jun 09, 2022     TECHNIQUE:  Axial T1, stir, proton with fat sat, T1 pre and postcontrast with fat sat, sagittal T1, sagittal STIR, coronal T1, coronal STIR, coronal T1 postcontrast. The current study concentrates on the forefoot     FINDINGS:  There is deep soft tissue ulceration on the plantar aspect of the foot near the 1st metatarsal-phalangeal joint. The ulcer extends close to the tibial sesamoid. There is minimal marrow edema in the sesamoid, head of the 1st metatarsal and base of the 1st proximal phalanx. There is a  joint effusion in the 1st metatarsal-phalangeal joint. There is soft tissue edema and soft tissue enhancement near the 1st metatarsal-phalangeal joint. There is no large soft tissue fluid to the suggests large abscess.     There is soft tissue prominence on the plantar aspect of the foot near the 5th metatarsal  phalangeal joint. This is likely related to degenerative changes.     There is no evidence of fracture or dislocation.     IMPRESSION:  1. Deep soft tissue ulcer plantar aspect of the foot near the 1st metatarsal-phalangeal joint  2. Minimal marrow edema 1st metatarsal-phalangeal joint area likely related to arthritis/reactive.  3. Joint effusion in the 1st metatarsal-phalangeal joint the  4. Soft tissue edema likely related to infection/cellulitis. No large focal abscess              Specimen Collected: 06/10/22 17:38 EDT Last Resulted: 06/10/22 17:49 EDT             ASSESSMENT/PLAN:       ICD-10-CM    1. Diabetic foot infection (HCC)  E11.628     L08.9       2. Acute kidney injury (HCC)  N17.9          I have reviewed the patient's chart for all new relevant clinical documentation, imaging and laboratory data.    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    His MRI shows effusion of the 1st MTPJ but no fluid collection or abscess.  No evidence of osteomyelitis.    On exam, cellulitis continues to improve, but with a deep probing wound beneath the 1st metatarsal head which will require packing.  There is no fluid or drainage expressed with compression of adjacent soft tissue structures.  I do not believe there is a drainable fluid collection at this point.  No surgical intervention planned at this time.     Culture from 06/10/2022 without gross thus far.  He continues with vancomycin and Zosyn empirically.  He is very eager to get out of the hospital and return to work for financial reasons.  I would like to give another day of IV antibiotics while we await finalization of his culture. It would be reasonable, pending culture results, to cover MRSA with doxycycline and some gram negative coverage with Cipro in the setting of recent AKI.     I will ask care  coordination to set up VNA for MWF packing as I do not believe he will be able to do this himself.     He worked with PT yesterday and unable to be completely nonweightbearing to the left lower extremity.  I adjusted his PEG assist insole to ensure appropriate offloading beneath the 1st metatarsal head of the left foot.  Will need to limit his activities as much as possible focusing weight on the heel, partial weight-bearing to the left foot with his device.    Wound irrigated and cleansed.  Packed with Exufiber Ag, Adaptic and a dry sterile dressing.  Secured with a Ace bandage.  Podiatry will perform dressing changes.  Reinforce as needed.    Patient understands that he remains at high risk for pedal amputation.  All questions were entertained and answered.  Patient understands and is satisfied with the plan moving forward.    Please note that portions of this document were created using the M*Modal Fluency Direct dictation system.  Any inconsistencies or typographical errors may be the result of mis-transcription that persist in spite of proof-reading and should be addressed with the document creator.      Dominic Stephens Devoria Albe, DPM, FACFAS

## 2022-06-12 NOTE — Care Coordination-Inpatient (Signed)
Case Management IDT Rounding Note    Interdisciplinary rounds were held on 06/12/2022 for [name] with the following team members: Care Management, Nursing, Physician, RN Charge Nurse, and Pharmacy    Tabletop Rounds held?: Yes    Attending provider: Roma Schanz, MD.    Plan of care was discussed:  Patient admitted with diabetic foot wound. Podiatry following. Patient having difficulty maintaining NWB status.    Planned discharge date: 48-72 hours     Current ADT Status: Inpatient.    Current LOS: 3 .    Discharge destination: Home without services.    Discharge Barriers: none identified.

## 2022-06-12 NOTE — Plan of Care (Signed)
Assumed care of pt at 0700.   VS and assessment documented  Fall safety bundle in place, hourly rounding in place.  No needs vocalized at this time.  Report given, Heather RN    Problem: Pain  Goal: Verbalizes/displays adequate comfort level or baseline comfort level  Outcome: Progressing     Problem: Discharge Planning  Goal: Discharge to home or other facility with appropriate resources  Outcome: Progressing     Problem: Chronic Conditions and Co-morbidities  Goal: Patient's chronic conditions and co-morbidity symptoms are monitored and maintained or improved  Outcome: Progressing     Problem: Safety - Adult  Goal: Free from fall injury  Outcome: Progressing     Problem: ABCDS Injury Assessment  Goal: Absence of physical injury  Outcome: Progressing

## 2022-06-12 NOTE — Progress Notes (Signed)
Hospitalist Progress Note    NAME:  Dominic Stephens   DOB:   14-Sep-1955   MRN:   [id number]     Date of Service: 06/12/2022      Subjective:   Feels leg is a lot better no pain or discomfort      Assessment/Plan:     67 year old male with history of diabetes type 2 admitted with diabetic foot infection.      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 antibiotics   Nonweightbearing status to left lower extremity   Dressing changes per Podiatry     AKI:   Improved with IV fluids     DMII:   Glipizide on hold   Continue sliding scale insulin monitor blood sugars q.a.c. q.h.s.    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:   On antibiotics per above likely colonized          Objective:   Vitals:  BP 132/62   Pulse 53   Temp 97.6 F (36.4 C) (Oral)   Resp 18   Ht 1.956 m (6\' 5" )   Wt 127 kg (280 lb)   SpO2 99%   BMI 33.20 kg/m         Temp (24hrs), Avg:98 F (36.7 C), Min:97.6 F (36.4 C), Max:98.5 F (36.9 C)      Last 24hr Input/Output:    Date 06/12/22 0000 - 06/12/22 2359   Shift 0000-0759 0800-1559 1600-2359 24 Hour Total   INTAKE   P.O.(mL/kg/hr)  600  600   Shift Total(mL/kg)  600(4.7)  600(4.7)   OUTPUT   Urine(mL/kg/hr) 3000(3)   3000   Shift Total(mL/kg) 3000(23.6)   3000(23.6)   Weight (kg) 127 127 127 127            PHYSICAL EXAM:    Physical Exam  Constitutional:       Appearance: Normal appearance.   HENT:      Head: Normocephalic and atraumatic.   Cardiovascular:      Rate and Rhythm: Normal rate.      Heart sounds: No murmur heard.  Pulmonary:      Effort: No respiratory distress.      Breath sounds: No wheezing or rales.   Musculoskeletal:      Comments: Left foot dressed             Lab Data Reviewed:    No components found for: "[facility]"     Recent Labs     06/10/22  0447 06/11/22  0648   WBC  10.9* 9.4   HGB 12.6* 12.5*   HCT 39.5* 38.1*   PLT 174 168          Recent Labs     06/10/22  0447 06/11/22  0648   INR 1.2*  --    NA 140 141   K 4.0 4.0   CL 108* 108*   CO2 20* 22   BUN 32* 24*          Recent Labs     06/10/22  0447   INR 1.2*             No results found for: "CKTOTAL", "CKMB", "CKMBINDEX", "TROPONINI"         No results found for: "CHOL", "CHLST", "CHOLV", "HDL", "HDLC", "LDL"  MRI FOOT LEFT W WO CONTRAST    Result Date: 06/10/2022  EXAM: MRI FOOT LT W WO CONT HISTORY: Concern for abscess/osteomyelitis left 1st ray COMPARISON: Plain film from Jun 09, 2022 TECHNIQUE: Axial T1, stir, proton with fat sat, T1 pre and postcontrast with fat sat, sagittal T1, sagittal STIR, coronal T1, coronal STIR, coronal T1 postcontrast. The current study concentrates on the forefoot FINDINGS: There is deep soft tissue ulceration on the plantar aspect of the foot near the 1st metatarsal-phalangeal joint. The ulcer extends close to the tibial sesamoid. There is minimal marrow edema in the sesamoid, head of the 1st metatarsal and base of the 1st proximal phalanx. There is a  joint effusion in the 1st metatarsal-phalangeal joint. There is soft tissue edema and soft tissue enhancement near the 1st metatarsal-phalangeal joint. There is no large soft tissue fluid to the suggests large abscess. There is soft tissue prominence on the plantar aspect of the foot near the 5th metatarsal phalangeal joint. This is likely related to degenerative changes. There is no evidence of fracture or dislocation.     1. Deep soft tissue ulcer plantar aspect of the foot near the 1st metatarsal-phalangeal joint 2. Minimal marrow edema 1st metatarsal-phalangeal joint area likely related to arthritis/reactive. 3. Joint effusion in the 1st metatarsal-phalangeal joint the 4. Soft tissue edema likely related to infection/cellulitis. No large focal abscess     VL DUP LOWER EXTREMITY VENOUS LEFT    Result Date: 06/10/2022  EXAM: VL DUPLEX  LOWER EXT VENOUS LEFT HISTORY: LLE pain, elevated d dimer COMPARISON: None. TECHNIQUE: Ultrasound DVT study of the left lower extremity performed.  The Doppler portion is performed with both color and spectral Doppler.  This study is performed with and without compression. FINDINGS: Good blood flow is seen in the left common femoral, superficial femoral, and popliteal veins. Phasic waveforms are demonstrated. There is no evidence of deep venous thrombosis of the left calf veins.     No evidence of DVT in the left lower extremity              ___________________________________________________    Hospitalist: Roma Schanz, MD     ___________________________________________________

## 2022-06-12 NOTE — Progress Notes (Signed)
Physical Therapy  [facility]  Physical Therapy Treatment Note/  Discharge Summary    Patient: Dominic Stephens (67 y.o. male)                        DOB: 11-08-1955   Room Number: 311/01                                     Date: 06/12/22   Start Time:  Time In: 1340  End Time:  Time Out: 1453     Primary Diagnosis: Diabetic foot infection (HCC)  Present on Admission:   Diabetic foot infection (HCC)         Restrictions/Precautions:   Restrictions/Precautions  Restrictions/Precautions: Fall Risk, Weight Bearing, Contact Precautions Lower Extremity Weight Bearing Restrictions  Left Lower Extremity Weight Bearing: Non Weight Bearing  WB Status: NWB LLE    Change in Medical Condition: secondary to change in weight bearing status the patient demonstrated improved ability to ambulate with crutches.     Subjective/Vitals   Subjective: The patient reports he is feeling okay.    Pain Assessment: none reported.                Vital Signs:               Objective   PT findings: (pulled from flowsheets; blank = not applicable or not assessed)  Cognition and Orientation:        Functional Mobility Training:   Supine to sit - independent  Sit to stand - stand by assist  Stand to sit - stand by assist  Sit to supine - independent       Neuromuscular Education:       Other Treatments/Modalities:    Gait Training: ambulation in room, approximate total of around 50 feet with bilateral crutches and use of post - op shoe with insole on left foot and combination of non weight bearing and partial weight bearing through left heel.         PT Exercises:       Assessment and Recommendations   Assessment:  The patient presented alert and oriented x 4 and willing to participate in physical therapy. Had patient attempt ambulation with crutches secondary to inability to adhere to non-weight bearing precautions with rolling walker. The patient continued to demonstrate difficulty with staying off of left foot. Contacted Dr. Lawrence Marseilles,  podiatrist, and confirmed that patient is able to perform partial weight bearing through left heel while wearing post - op shoe with insole. The patient performed much better with crutches and partial weight bearing through left heel with post - op shoe. However, I observed insole sliding out from post - op shoe. I made podiatrist and nursing aware of this issue. Unsure if it can be fixed or if the patient needs a new shoe. Podiatry replied and stated they will try to get patient a new shoe. The patient talked about possibly being able to stay with his sister, who has no stairs. The patient will continue to benefit from skilled physical therapy interventions to improve strength, mobility, and gait pattern to improve tolerance and safety with ambulation. Upon discharge it is recommended the patient receive crutches, have his post - op shoe repaired or replaced, and preferably stay with his sister so he does not have to navigate stairs.      Discharge Recommendations:  Based on therapist evaluation of mobility and function:    [x]  Patient IS recommended for discharge to home/prior location (assisted living, LTC).         [x]  For successful discharge home, the following supports are recommended:   []  Home services:    [x]  Adaptive equipment/DME: crutches, post - op shoe with repaired or new insole, possibly knee scooter if available.    [x]  Supervision/assist during mobility    [x]  Family/caregiver education - see if patient can stay with his sister   []  Other:     []  Patient IS NOT recommended for discharge to home/prior location (assisted living, LTC).         []  The following factors currently limit the patient from home discharge:   []  Not yet medically optimized   []  Inadequate pain control   []  Unstable/abnormal vital signs during mobility   []  Gait instability/poor balance/inability to transfer  []  Increased fall risk     []  Cognitive status affecting safety awareness   []  Limited social supports/assistance at  home  []  Architectural barriers in the home  []  Needs to increase mobility with floor staff until discharge  []  Other:     []  At this time, it is felt that patient needs cannot be safely met in the home setting; will recommend facility-based rehab ([facility]).    Equipment Recommendations: Crutches - axillary and post - op shoe with insole and knee scooter if available     Care Plan discussed with:  [x] Patient   [] Family   [x] Case Manager/Social Work    [x] Nursing     [x] Physician/PA/NP        Patient Education and Safety   Patient Education: Educated patient on safe ambulation with crutches and partial weight bearing; educated patient on staying with sister rather than returning home so he does not have to navigate stairs at this time.         Safety:   []  Bedrails replaced after session   []  Staff assist needed for mobility   []  Use of assistive device as recommended for mobility    [x]  Call light within reach   [x]  All personal items within reach   []  Bed/chair/personal alarm engaged   []  Activity info/level of assist documented on white board in room     Modified Rankin Score (mRS):    *CVA and TIA patients only *     Score Description   []  0 No symptoms at all   []  1 No significant disability despite symptoms; able to carry out all usual duties and activities   []  2 Slight disability; unable to carry out all previous activities but able to look after own affairs without assistance   []  3 Moderate disability; requiring some help (e.g with shopping/managing affairs) but able to walk without assistance.    []  4 Moderately severe disability, unable to walk without assistance and unable to attend to own bodily needs without assistance.    []  5 Severe disability; bedridden, incontinent and requiring constant nursing care and attention.    []  6 Dead     [facility] AM-PACTM '6 Clicks'    Basic Mobility Inpatient Short Form   Please check the box that reflects your best answer to each question. (If the patient  hasn't done an activity recently, how much help from another person do you think he/she would need if he/she tried?)   How much difficulty does the patient currently have. Total A Lot A  Little None   1. Turning over in bed (including adjusting bedclothes, sheets and blankets)?  [] 1 [] 2 [] 3 [x] 4   2. Sitting down on and standing up from a chair with arms (e.g., wheelchair, bedside commode, etc.)  [] 1 [] 2 [x] 3 [] 4   3. Moving from lying on back to sitting on the side of the bed [] 1 [] 2 [] 3 [x] 4   How much help from another person does the patient currently need. Total A Lot A Little None   4. Moving to and from a bed to a chair (including a wheelchair)? [] 1 [] 2 [x] 3 [] 4   5. To walk in hospital room?  [] 1 [] 2 [x] 3 [] 4   6. Climbing 3-5 steps with a railing? [] 1 [x] 2 [] 3 [] 4   AM-PAC Short Form Manual (v. 3.0)  2016, Trustees of [address], under license to [place], [place]. All rights reserved.   Interpretation of Tool: Represents clinically-significant functional categories (i.e.Activities of daily living).   Raw Score: 19  [employer] Veasey A Sloan Takagi, PT  06/12/22

## 2022-06-13 ENCOUNTER — Encounter

## 2022-06-13 LAB — POCT GLUCOSE
POC Glucose: 170 mg/dL
POC Glucose: 183 mg/dL
POC Glucose: 225 mg/dL
POC Glucose: 206 mg/dL

## 2022-06-13 MED ORDER — CIPROFLOXACIN HCL 500 MG PO TABS
500 | ORAL_TABLET | Freq: Two times a day (BID) | ORAL | 0 refills | Status: AC
Start: 2022-06-13 — End: 2022-06-23

## 2022-06-13 MED ORDER — VANCOMYCIN INTERMITTENT DOSING (PLACEHOLDER)
Freq: Once | INTRAVENOUS | Status: DC
Start: 2022-06-13 — End: 2022-06-13

## 2022-06-13 MED ORDER — LINEZOLID 600 MG PO TABS
600 | ORAL_TABLET | Freq: Two times a day (BID) | ORAL | 0 refills | Status: DC
Start: 2022-06-13 — End: 2022-06-20

## 2022-06-13 MED FILL — VANCOMYCIN HCL 10 G IV SOLR: 10 g | INTRAVENOUS | Qty: 1500

## 2022-06-13 MED FILL — ATENOLOL 25 MG PO TABS: 25 MG | ORAL | Qty: 2

## 2022-06-13 MED FILL — PIPERACILLIN SOD-TAZOBACTAM SO 3.375 (3-0.375) G IV SOLR: 3.375 (3-0.375) g | INTRAVENOUS | Qty: 3375

## 2022-06-13 MED FILL — SODIUM CHLORIDE 0.9 % IV SOLN: 0.9 % | INTRAVENOUS | Qty: 100

## 2022-06-13 MED FILL — HEPARIN SODIUM (PORCINE) 5000 UNIT/ML IJ SOLN: 5000 UNIT/ML | INTRAMUSCULAR | Qty: 1

## 2022-06-13 MED FILL — VANCOMYCIN 1500 MG NS 500 ML (PREMIX) IVPB: Qty: 500

## 2022-06-13 NOTE — Progress Notes (Addendum)
[name]   Date:  06/13/22     HPI: Dominic Stephens is a 67 y.o. male with a past medical history most significant for uncontrolled type 2 diabetes with neuropathy on admission for a left diabetic foot infection.      He is feeling well this morning. No pain. No overnight events. Anxious to get back to work. No new concerns.     Review of systems:  Negative except as stated above.       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     Infection caused by Enterobacter cloacae     MRSA (methicillin resistant staph aureus) culture positive     Renal stones     Urinary bladder stone     Urinary catheter (Foley) change required     UTI (urinary tract infection)     UTI (urinary tract infection) due to urinary indwelling Foley catheter (HCC)     Wound cellulitis     diabetic        Past Surgical History:   Procedure Laterality Date    ORTHOPEDIC SURGERY      Lumbar discecomy 2001          Current Facility-Administered Medications:     vancomycin trough reminder, 1 each, Other, Once, Chander, Amit, MD    sodium chloride flush 0.9 % injection 5-40 mL, 5-40 mL,  IntraVENous, 2 times per day, Lorrene Reid, APRN - NP, 10 mL at 06/12/22 2045    sodium chloride flush 0.9 % injection 5-40 mL, 5-40 mL, IntraVENous, PRN, Lorrene Reid, APRN - NP    0.9 % sodium chloride infusion, , IntraVENous, PRN, Lorrene Reid, APRN - NP    ondansetron (ZOFRAN-ODT) disintegrating tablet 4 mg, 4 mg, Oral, Q8H PRN **OR** ondansetron (ZOFRAN) injection 4 mg, 4 mg, IntraVENous, Q6H PRN, Lorrene Reid, APRN - NP    polyethylene glycol (GLYCOLAX) packet 17 g, 17 g, Oral, Daily PRN, Lorrene Reid, APRN - NP    acetaminophen (TYLENOL) tablet 650 mg, 650 mg, Oral, Q6H PRN **OR** acetaminophen (TYLENOL) suppository 650 mg, 650 mg, Rectal, Q6H PRN, Lorrene Reid, APRN - NP    heparin (porcine) injection 5,000 Units, 5,000 Units, SubCUTAneous, 3 times per day, Lorrene Reid, APRN - NP, 5,000 Units at 06/11/22 1449    piperacillin-tazobactam (ZOSYN) 3,375 mg in sodium chloride 0.9 % 100 mL IVPB (mini-bag), 3,375 mg, IntraVENous, Q8H, Lorrene Reid,  APRN - NP, Stopped at 06/13/22 0045    atenolol (TENORMIN) tablet 50 mg, 50 mg, Oral, BID, Lorrene Reid, APRN - NP, 50 mg at 06/12/22 2045    vancomycin (VANCOCIN) 1500 mg in sodium chloride 0.9% 500 mL IVPB, 1,500 mg, IntraVENous, Q12H, Najjar, Majed Y, MD, Last Rate: 333.3 mL/hr at 06/12/22 1710, 1,500 mg at 06/12/22 1710    vancomycin (VANCOCIN) intermittent dosing (placeholder), 1 each, Other, RX Placeholder, Najjar, Reece Leader, MD    Vancomycin Trough at 06/11/22 1230, 1 each, Other, Once, Najjar, Majed Y, MD    insulin lispro (1 Unit Dial) (HUMALOG/ADMELOG) pen 0-4 Units, 0-4 Units, SubCUTAneous, TID WC, Curtis, Waldemar Dickens, APRN - NP    insulin lispro (1 Unit Dial) (HUMALOG/ADMELOG) pen 0-4 Units, 0-4 Units, SubCUTAneous, Nightly, Lyda Jester, Waldemar Dickens, APRN - NP    glucose chewable tablet 16 g, 4 tablet, Oral, PRN, Lorrene Reid, APRN - NP    dextrose bolus 10% 125 mL, 125 mL, IntraVENous, PRN **OR**  dextrose bolus 10% 250 mL, 250 mL, IntraVENous, PRN, Lorrene Reid, APRN - NP    glucagon injection 1 mg, 1 mg, SubCUTAneous, PRN, Lorrene Reid, APRN - NP    dextrose 10 % infusion, , IntraVENous, Continuous PRN, Lorrene Reid, APRN - NP     Family History   Problem Relation Age of Onset    Hypertension Mother     Diabetes Father     Diabetes Mother         Social History     Tobacco Use    Smoking status: Never     Passive exposure: Past    Smokeless tobacco: Never   Vaping Use    Vaping Use: Never used   Substance Use Topics    Alcohol use: Not Currently    Drug use: Not Currently        Vitals:    06/13/22 0421   BP: 136/71   Pulse: 65   Resp: 16   Temp: 98 F (36.7 C)   SpO2: 95%        Physical Exam:  General:  He is alert and oriented.  Nontoxic appearing.  Psych:  Cooperative with exam and pleasant.  LLE  Pedal pulses are palpable.  Sensation light touch is absent.  Cellulitis improved to the toe.  The There is no fluctuance or warmth along the dorsal aspect of the joint.  There continues to be soft tissue swelling plantarly.  A small amount of serous drainage expressed from the ulceration. No purulence. He has a deep probing wound within the central aspect of his ulceration and does probe near bone.  No significant hyperkeratotic tissue in the periphery.  Wound is hemostatic.  Plantar flexed 1st ray.  He has semi-rigid hammertoes of all lesser digits.  No pain with 1st MPJ or IPJ range of motion of the left great toe.    Radiographs:  XR FOOT LEFT (MIN 3 VIEWS)  Order: [id number]  Status: Final result       Visible to patient: No (not released)       Next appt: 06/19/2022 at 02:15 PM in Podiatry Marguerite Olea, DPM)    0 Result Notes  Details    Reading Physician Reading Date Result Priority   Deforest Hoyles, MD  [phone] 06/09/2022      Narrative & Impression  EXAM:  XR FOOT LT MIN 3 V     HISTORY:  Medic foot infection rule out  osteomyelitis     COMPARISON:  None.      TECHNIQUE:  Three views of the left foot     FINDINGS:  There is an ulcer on the plantar aspect of forefoot. There is no fracture. There is minimal hallux valgus. Normal bone mineralization. No erosion. Joint spaces are preserved.     IMPRESSION:  No radiographic evidence of osteomyelitis.              Specimen Collected: 06/09/22 12:23 EDT Last Resulted: 06/09/22 12:24 EDT           MRI:  MRI FOOT LEFT W WO CONTRAST  Order: [id number]  Status: Final result       Visible to patient: No (not released)       Next appt: 06/19/2022 at 02:15 PM in Podiatry Marguerite Olea, DPM)    0 Result Notes  Details    Reading Physician Reading Date Result Priority   Tilda Franco, MD  [phone] 06/10/2022      Narrative & Impression  EXAM:  MRI FOOT LT W WO CONT     HISTORY:  Concern for abscess/osteomyelitis left 1st ray     COMPARISON:  Plain film from Jun 09, 2022     TECHNIQUE:  Axial T1, stir, proton with fat sat, T1 pre and postcontrast with fat sat, sagittal T1, sagittal STIR, coronal T1, coronal STIR, coronal T1 postcontrast. The current study concentrates on the forefoot     FINDINGS:  There is deep soft tissue ulceration on the plantar aspect of the foot near the 1st metatarsal-phalangeal joint. The ulcer extends close to the tibial sesamoid. There is minimal marrow edema in the sesamoid, head of the 1st metatarsal and base of the 1st proximal phalanx. There is a  joint effusion in the 1st metatarsal-phalangeal joint. There is soft tissue edema and soft tissue enhancement near the 1st metatarsal-phalangeal joint. There is no large soft tissue fluid to the suggests large abscess.     There is soft tissue prominence on the plantar aspect of the foot near the 5th metatarsal phalangeal joint. This is likely related to degenerative changes.     There is no evidence of fracture or dislocation.     IMPRESSION:  1. Deep soft tissue ulcer plantar aspect of the foot near the 1st metatarsal-phalangeal joint  2. Minimal marrow edema  1st metatarsal-phalangeal joint area likely related to arthritis/reactive.  3. Joint effusion in the 1st metatarsal-phalangeal joint the  4. Soft tissue edema likely related to infection/cellulitis. No large focal abscess              Specimen Collected: 06/10/22 17:38 EDT Last Resulted: 06/10/22 17:49 EDT             ASSESSMENT/PLAN:       ICD-10-CM    1. Diabetic foot infection (HCC)  E11.628     L08.9       2. Acute kidney injury (HCC)  N17.9          I have reviewed the patient's chart for all new relevant clinical documentation, imaging and laboratory data.    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    His MRI shows effusion of the 1st MTPJ but no fluid collection or abscess.  No evidence of osteomyelitis.    On exam, cellulitis continues to improve, but with  a deep probing wound beneath the 1st metatarsal head which will require packing. Mild amount of serous drainage expressed with compression of adjacent soft tissue structures. No purulence. I do not believe there is a drainable fluid collection at this point.  No surgical intervention planned at this time.     Culture from 06/10/2022 without growth.  He continues with vancomycin and Zosyn empirically.  He is very eager to get out of the hospital and return to work for financial reasons.  It would be reasonable to discharge to with PO abx to cover MRSA with doxycycline and some gram negative coverage with Cipro in the setting of recent AKI.     He will require MWF wound care with [facility]. Discussed with care coordination.  We will place discharge wound care instructions in the discharge tab.    He worked with PT  and unable to be completely nonweightbearing to the left lower extremity.  I adjusted his PEG assist insole to ensure appropriate offloading beneath the 1st metatarsal head of the left foot.  He  would benefit from a new device.  He will look into taping or gluing the insole down to the offloading insole to the postoperative shoe in the meantime.  I currently do not have access to his size with is extremely large foot.  He will need to limit his activities as much as possible focusing weight on the heel, partial weight-bearing to the left foot with his device.    Wound irrigated and cleansed.  Packed with Exufiber Ag and dressed with a Mepilex adhesive border.      Patient understands that he remains at high risk for pedal amputation.  He will need close follow-up.  I was planning on seeing him in the office next Tuesday around 2:00 p.m.; however, the patient has appointment with his diabetic shoe fitting for casting at the same time.  We will reschedule for 2:00 p.m. on Thursday.  All questions were entertained and answered.  Patient understands and is satisfied with the plan moving forward.  Discussed with hospitalist and care coordination.    Please note that portions of this document were created using the M*Modal Fluency Direct dictation system.  Any inconsistencies or typographical errors may be the result of mis-transcription that persist in spite of proof-reading and should be addressed with the document creator.    ADDENDUM 06/13/22:  -I provided the patient a sign letter for return to work with the postoperative shoe as well as dates of absence.  -discussed with hospitalist in regards to antibiotic plan.  Patient has a history of urine MRSA with intermediate tetracycline resistance.  He has a sulfa allergy.  P.o. antibiotics are limited.  Hospitalist will send for linezolid.  We will need to get Infectious Disease involved if not approved by insurance.  -I provided the patient with a new offloading Pro care insole and postoperative shoe removing the pegs beneath the 1st metatarsal for offloading.  This approved to fit appropriately.  As much more secure to his foot and secured with Velcro for the  plantar insole peg interface.  -unfortunately, the patient does not qualify for home services.  He will need to pack his dressings daily.  I have asked care coordination to assist with supplies and have nursing education prior to discharge.  Initially the plan was for Monday, Wednesday and Friday dressing changes.  I would prefer if he was able to change this daily.  Still plan on the Aquacel Ag rope if  available.    All questions were entertained and answered.  I have secured follow-up for next Thursday.  He will reach out to the office prior to this with any questions or concerns.    Raistlin Gum Devoria Albe, DPM, FACFAS

## 2022-06-13 NOTE — Consults (Signed)
Discharge Medication Reconciliation Counseling    Name: Dominic Stephens, Dominic Stephens; Age:67 y.o.; Gender: male   MRN: [id number] Unit/Bed: 311/01  Allergies: Methocarbamol, Sulfa antibiotics, and Sulfamethoxazole-trimethoprim    Counseling Provided To: Patient     Prior to Admission Medications:  Medications Prior to Admission: atenolol (TENORMIN) 50 MG tablet, Take 1 tablet by mouth 2 times daily  glimepiride (AMARYL) 2 MG tablet, Take 1 tablet by mouth 2 times daily (with meals)  blood glucose test strips (ONETOUCH ULTRA) strip, Test sugars once daily E11.65  Blood Glucose Monitoring Suppl (ONE TOUCH ULTRA 2) w/Device KIT, Test sugars once daily E11.65  ONE TOUCH ULTRASOFT LANCETS MISC, Test sugars once daily E11.65      Discharge Medications:  Current Discharge Medication List        START taking these medications    Details   linezolid (ZYVOX) 600 MG tablet Take 1 tablet by mouth 2 times daily for 10 days  Qty: 20 tablet, Refills: 0      ciprofloxacin (CIPRO) 500 MG tablet Take 1 tablet by mouth 2 times daily for 10 days  Qty: 20 tablet, Refills: 0           CONTINUE these medications which have NOT CHANGED    Details   atenolol (TENORMIN) 50 MG tablet Take 1 tablet by mouth 2 times daily  Qty: 180 tablet, Refills: 3    Associated Diagnoses: Primary hypertension      glimepiride (AMARYL) 2 MG tablet Take 1 tablet by mouth 2 times daily (with meals)  Qty: 180 tablet, Refills: 1    Associated Diagnoses: Type 2 diabetes mellitus with hyperglycemia, without long-term current use of insulin (HCC)      blood glucose test strips (ONETOUCH ULTRA) strip Test sugars once daily E11.65  Qty: 100 each, Refills: 3    Associated Diagnoses: Type 2 diabetes mellitus with hyperglycemia, without long-term current use of insulin (HCC)      Blood Glucose Monitoring Suppl (ONE TOUCH ULTRA 2) w/Device KIT Test sugars once daily E11.65  Qty: 1 kit, Refills: 0    Associated Diagnoses: Type 2 diabetes mellitus with hyperglycemia, without long-term  current use of insulin (HCC)      ONE TOUCH ULTRASOFT LANCETS MISC Test sugars once daily E11.65  Qty: 100 each, Refills: 3    Associated Diagnoses: Type 2 diabetes mellitus with hyperglycemia, without long-term current use of insulin (HCC)             Emphasized difference of medication regimen between prior to admission and upon discharge    Agent specific counseling:  Ciprofloxacin - instructed patient to take 1 tablet twice daily for ten days.  Informed patient that this medication was prescribed for treatment of diabetic foot infection.  Discussed with patient that ciprofloxacin may cause tendon irritation, or rupture.  Emphasized the importance of seeking medical attention if patient develops bruising/pain/swelling in ankles/hands/shoulders, or is unable to bear weight on joints.  Patient asked if these symptoms will resolve on their own.  Educated patient that some tendon injuries may resolve on their own, while others require medical intervention.  Informed patient that ciprofloxacin may cause him to sunburn more easily.  Encouraged patient to wear protective clothing (hat, long-sleeve shirt) and use sunscreen for protection.  Informed patient to avoid prolonged sun exposure.  Discussed with patient that this medication may cause diarrhea, a side effect common to antibiotic therapy.  Encouraged patient to seek medical attention if patient has bloody, loose, or watery stool during  treatment.  Informed patient that this may be a sign of C.Diff, an infectious diarrhea that requires medical management.  Discussed with patient that this medication may be taken with, or without, food.  Educated patient on the importance of avoiding antacids and dairy products (yogurt and milk) two hours before, or six hours after, administration of ciprofloxacin.  Encouraged patient to take all doses as prescribed, even if he begins to show symptom improvement.  Linezolid - instructed patient to take 1 tablet twice daily for ten  days.  Informed patient that this medication was prescribed for treatment of diabetic foot infection.  Discussed with patient that this medication may cause diarrhea, a side effect common with antibiotic therapy.  Emphasized the importance of seeking medical attention if patient develops bloody, loose, or watery stool, throughout treatment.  Informed patient that this may be a sign of C.Diff, an infectious diarrhea that requires medical management.  Encouraged patient to take all doses as prescribed, even if he begins to show symptom improvement.    Confirmed patient's preferred local pharmacy during discussion.  Informed patient that prescriptions were sent to [facility] outpatient pharmacy.  Case management presented to patient's room to inform him that medications were covered by the hospital's [place] fund.  Informed patient that this service provides monetary coverage for prescriptions that are cost-prohibitive.  Due to patient's limited mobility, offered to retrieve his medications for bedside delivery.  Patient accepted offer and verbal consent obtained for prescription retrieval.  Reviewed patient's upcoming follow-up and office visit appointments during discussion.  Encouraged patient to contact his care team, should he require medical assistance.  Informed patient that all prior-to-admission medications were continued for discharge.  Patient asked if ciprofloxacin and linezolid can be taken together.  Informed patient that no drug-drug interactions exist with coadministration of these antibiotics.  Discussed MyChart and patient indicated to this pharmacist that he does not currently use this platform.  Encouraged patient to enroll in this service and reviewed portal benefits, including access to medical record and ability to communicate with doctor's office for prescription refill requests.  During discussion, patient asked how to obtain his wound care supplies.  Patient's nurse reported that  a sufficient supply will be sent home upon discharge and, when more materials are required, will be at the patient's expense.  Encouraged patient to bring After Visit Summary paperwork to future follow-up and office visit appointments, so care team is aware of changes made during hospitalization.  All questions answered during discharge counsel session and patient thankful for thorough review of discharge expectations.    Understanding of new medications confirmed via teach-back    Signed: Vira Blanco, RPH, Pharm.D, R.Ph on Jun 13, 2022

## 2022-06-13 NOTE — Discharge Summary (Addendum)
Hospitalist Discharge Summary        Name:  Dominic Stephens     DOB:  February 24, 1955    MRM:  [id number]      Admit Date: 06/09/2022      Discharge Date:  06/13/2022      Primary Care Provider: Juanita Laster, MD          Discharge Diagnosis:    Active Hospital Problems    Diagnosis Date Noted    Diabetic foot infection (HCC) 06/09/2022         All of the above diagnoses were present on admission.      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     Infection caused by Enterobacter cloacae     MRSA (methicillin resistant staph aureus) culture positive     Renal stones     Urinary bladder stone     Urinary catheter (Foley) change required     UTI (urinary tract infection)     UTI (urinary tract infection) due to urinary indwelling Foley catheter (HCC)     Wound cellulitis     diabetic        PAST SURGICAL HISTORY:  Past Surgical History:   Procedure Laterality Date    ORTHOPEDIC SURGERY      Lumbar discecomy 2001       DISCHARGE MEDS:  Current Discharge Medication List        START taking these medications    Details   linezolid (ZYVOX) 600 MG tablet Take 1 tablet by mouth 2 times daily for 10 days  Qty: 20 tablet, Refills: 0  Start date: 06/13/2022, End date: 06/23/2022      ciprofloxacin (CIPRO) 500 MG tablet Take 1 tablet by mouth 2 times daily for 10 days  Qty: 20 tablet, Refills: 0  Start date: 06/13/2022, End date: 06/23/2022           CONTINUE these medications which have NOT CHANGED    Details   atenolol (TENORMIN) 50 MG tablet Take 1 tablet by mouth 2 times daily  Qty: 180 tablet, Refills: 3    Associated Diagnoses: Primary hypertension      glimepiride (AMARYL) 2 MG tablet Take 1 tablet by mouth 2 times daily (with meals)  Qty: 180 tablet, Refills: 1    Associated  Diagnoses: Type 2 diabetes mellitus with hyperglycemia, without long-term current use of insulin (HCC)      blood glucose test strips (ONETOUCH ULTRA) strip Test sugars once daily E11.65  Qty: 100 each, Refills: 3    Associated Diagnoses: Type 2 diabetes mellitus with hyperglycemia, without long-term current use of insulin (HCC)      Blood Glucose Monitoring Suppl (ONE TOUCH ULTRA 2) w/Device KIT Test sugars once daily E11.65  Qty: 1 kit, Refills: 0    Associated Diagnoses: Type 2 diabetes mellitus with hyperglycemia, without long-term current use of insulin (HCC)      ONE TOUCH ULTRASOFT LANCETS MISC Test sugars once daily E11.65  Qty: 100 each, Refills: 3    Associated Diagnoses: Type 2 diabetes mellitus with hyperglycemia, without long-term current use of insulin (HCC)  ALLERGIES:  Allergies   Allergen Reactions    Methocarbamol Other (See Comments)    Sulfa Antibiotics Myalgia    Sulfamethoxazole-Trimethoprim Myalgia       Code Status  Full Code         ADMISSION HISTORY & HOSPTIAL COURSE:    67 year old male with history of diabetes type 2 admitted with diabetic foot infection.       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             Vitals:       BP (!) 148/77   Pulse 64   Temp 97.9 F (36.6 C) (Oral)   Resp 16   Ht 1.956 m (6\' 5" )   Wt 127 kg (280 lb)   SpO2 96%   BMI 33.20 kg/m            Physical Exam:       Physical Exam  Constitutional:        Appearance: Normal appearance.   HENT:      Head: Normocephalic and atraumatic.   Cardiovascular:      Rate and Rhythm: Normal rate.      Heart sounds: No murmur heard.  Pulmonary:      Effort: No respiratory distress.      Breath sounds: No rales.   Abdominal:      General: There is no distension.      Tenderness: There is no abdominal tenderness. There is no guarding.   Musculoskeletal:         General: No swelling.                      Lab Review:       Recent Results (from the past 24 hour(s))   POCT Glucose    Collection Time: 06/12/22  4:35 PM   Result Value Ref Range    POC Glucose 167 mg/dL    Performed by: Wojcik Mary    POCT Glucose    Collection Time: 06/12/22  9:09 PM   Result Value Ref Range    POC Glucose 170 mg/dL    Performed by: Wojcik Mary    POCT Glucose    Collection Time: 06/13/22  7:26 AM   Result Value Ref Range    POC Glucose 183 mg/dL    Performed by: Rosalita Levan    POCT Glucose    Collection Time: 06/13/22 11:45 AM   Result Value Ref Range    POC Glucose 225 mg/dL    Performed by: Rosalita Levan             Lab Results   Component Value Date/Time    INR 1.2 06/10/2022 04:47 AM      .Pertinent imaging studies (if any):    MRI FOOT LEFT W WO  CONTRAST    Result Date: 06/10/2022  EXAM: MRI FOOT LT W WO CONT HISTORY: Concern for abscess/osteomyelitis left 1st ray COMPARISON: Plain film from Jun 09, 2022 TECHNIQUE: Axial T1, stir, proton with fat sat, T1 pre and postcontrast with fat sat, sagittal T1, sagittal STIR, coronal T1, coronal STIR, coronal T1 postcontrast. The current study concentrates on the forefoot FINDINGS: There is deep soft tissue ulceration on the plantar aspect of the foot near the 1st metatarsal-phalangeal joint. The ulcer extends close to the tibial sesamoid. There is minimal marrow edema in the sesamoid, head of the 1st metatarsal and base of the 1st proximal phalanx. There is a  joint effusion in the 1st metatarsal-phalangeal joint. There is soft tissue edema  and soft tissue enhancement near the 1st metatarsal-phalangeal joint. There is no large soft tissue fluid to the suggests large abscess. There is soft tissue prominence on the plantar aspect of the foot near the 5th metatarsal phalangeal joint. This is likely related to degenerative changes. There is no evidence of fracture or dislocation.     1. Deep soft tissue ulcer plantar aspect of the foot near the 1st metatarsal-phalangeal joint 2. Minimal marrow edema 1st metatarsal-phalangeal joint area likely related to arthritis/reactive. 3. Joint effusion in the 1st metatarsal-phalangeal joint the 4. Soft tissue edema likely related to infection/cellulitis. No large focal abscess     VL DUP LOWER EXTREMITY VENOUS LEFT    Result Date: 06/10/2022  EXAM: VL DUPLEX LOWER EXT VENOUS LEFT HISTORY: LLE pain, elevated d dimer COMPARISON: None. TECHNIQUE: Ultrasound DVT study of the left lower extremity performed.  The Doppler portion is performed with both color and spectral Doppler.  This study is performed with and without compression. FINDINGS: Good blood flow is seen in the left common femoral, superficial femoral, and popliteal veins. Phasic waveforms are demonstrated. There is no evidence of deep venous thrombosis of the left calf veins.     No evidence of DVT in the left lower extremity     XR FOOT LEFT (MIN 3 VIEWS)    Result Date: 06/09/2022  EXAM: XR FOOT LT MIN 3 V HISTORY: Medic foot infection rule out osteomyelitis COMPARISON: None. TECHNIQUE: Three views of the left foot FINDINGS: There is an ulcer on the plantar aspect of forefoot. There is no fracture. There is minimal hallux valgus. Normal bone mineralization. No erosion. Joint spaces are preserved.     No radiographic evidence of osteomyelitis.       Condition: Stable    Diet: CCD    Activity: as tolerated.    DISPOSITION: Patient will be discharged Home or Self Care.    FOLLOW UP:  1. Please follow up with your outpatient primary care physician - Dr. Juanita Laster, MD in 2-4 days.       Greater than 35 minutes has been spent on this discharge process.

## 2022-06-13 NOTE — Plan of Care (Signed)
Problem: Pain  Goal: Verbalizes/displays adequate comfort level or baseline comfort level  Outcome: Completed     Problem: Discharge Planning  Goal: Discharge to home or other facility with appropriate resources  Outcome: Completed     Problem: Chronic Conditions and Co-morbidities  Goal: Patient's chronic conditions and co-morbidity symptoms are monitored and maintained or improved  Outcome: Completed     Problem: Safety - Adult  Goal: Free from fall injury  Outcome: Completed     Problem: ABCDS Injury Assessment  Goal: Absence of physical injury  Outcome: Completed

## 2022-06-13 NOTE — Progress Notes (Signed)
Physical Therapy  [facility]  Physical Therapy Treatment Note/  Discharge Summary    Patient: Dominic Stephens (67 y.o. male)                        DOB: July 04, 1955   Room Number: 311/01                                     Date: 06/13/22   Start Time:  Time In: 1458  End Time:  Time Out: 1512     Primary Diagnosis: Diabetic foot infection (HCC)  Present on Admission:   Diabetic foot infection (HCC)         Restrictions/Precautions:   Restrictions/Precautions  Restrictions/Precautions: Fall Risk, Weight Bearing, Contact Precautions Lower Extremity Weight Bearing Restrictions  Left Lower Extremity Weight Bearing: Non Weight Bearing  WB Status: NWB LLE    Change in Medical Condition: the patient demonstrated improved gait pattern and overall mobility.     Subjective/Vitals   Subjective: The patient reports he was able to get a new post - op shoe.    Pain Assessment: pain not reported               Vital Signs:               Objective   PT findings: (pulled from flowsheets; blank = not applicable or not assessed)  Cognition and Orientation:   Orientation  Overall Orientation Status: Within Normal Limits  Orientation Level: Oriented X4    Functional Mobility Training: supine to sit, it to stand, stand to sit at modified independence level       Neuromuscular Education:       Other Treatments/Modalities:    Gait Training: 5 x 10 feet with crutches and post - op shoe with partial weight bearing in left heel.        PT Exercises:       Assessment and Recommendations   Assessment:  The patient is alert and oriented x 4 and consents to participate in physical therapy. Educated patient on how crutches should be fitted to him and set up crutches appropriately for the patient. The patient had received a new post - op shoe that fit well. The patient was able to demonstrate appropriate gait pattern with crutches and correct weight bearing through his left heel. The patient is cleared from physical therapy standpoint to  stay with his sister. Upon discharge it is recommended the patient continue physical therapy as appropriate.     Discharge Recommendations:  Based on therapist evaluation of mobility and function:    [x]  Patient IS recommended for discharge to home/prior location (assisted living, LTC).         [x]  For successful discharge home, the following supports are recommended:   []  Home services:    [x]  Adaptive equipment/DME: patient already owns needed equipemnt   []  Supervision/assist during mobility    []  Family/caregiver education   []  Other:     []  Patient IS NOT recommended for discharge to home/prior location (assisted living, LTC).         []  The following factors currently limit the patient from home discharge:   []  Not yet medically optimized   []  Inadequate pain control   []  Unstable/abnormal vital signs during mobility   []  Gait instability/poor balance/inability to transfer  []  Increased fall risk     []   Cognitive status affecting safety awareness   []  Limited social supports/assistance at home  []  Architectural barriers in the home  []  Needs to increase mobility with floor staff until discharge  []  Other:     []  At this time, it is felt that patient needs cannot be safely met in the home setting; will recommend facility-based rehab (STR).    Equipment Recommendations: Crutches - axillary     Care Plan discussed with:  [x] Patient   [] Family   [x] Case Manager/Social Work    [x] Nursing     [] Physician/PA/NP        Patient Education and Safety   Patient Education: educated patient on how crutches should be fitted.         Safety:   []  Bedrails replaced after session   []  Staff assist needed for mobility   []  Use of assistive device as recommended for mobility    [x]  Call light within reach   [x]  All personal items within reach   []  Bed/chair/personal alarm engaged   []  Activity info/level of assist documented on white board in room     Modified Rankin Score (mRS):    *CVA and TIA patients only *     Score  Description   []  0 No symptoms at all   []  1 No significant disability despite symptoms; able to carry out all usual duties and activities   []  2 Slight disability; unable to carry out all previous activities but able to look after own affairs without assistance   []  3 Moderate disability; requiring some help (e.g with shopping/managing affairs) but able to walk without assistance.    []  4 Moderately severe disability, unable to walk without assistance and unable to attend to own bodily needs without assistance.    []  5 Severe disability; bedridden, incontinent and requiring constant nursing care and attention.    []  6 Dead     [facility] AM-PACTM '6 Clicks'    Basic Mobility Inpatient Short Form   Please check the box that reflects your best answer to each question. (If the patient hasn't done an activity recently, how much help from another person do you think he/she would need if he/she tried?)   How much difficulty does the patient currently have. Total A Lot A Little None   1. Turning over in bed (including adjusting bedclothes, sheets and blankets)?  [] 1 [] 2 [] 3 [x] 4   2. Sitting down on and standing up from a chair with arms (e.g., wheelchair, bedside commode, etc.)  [] 1 [] 2 [] 3 [x] 4   3. Moving from lying on back to sitting on the side of the bed [] 1 [] 2 [] 3 [x] 4   How much help from another person does the patient currently need. Total A Lot A Little None   4. Moving to and from a bed to a chair (including a wheelchair)? [] 1 [] 2 [] 3 [x] 4   5. To walk in hospital room?  [] 1 [] 2 [] 3 [x] 4   6. Climbing 3-5 steps with a railing? [] 1 [] 2 [x] 3 [] 4   AM-PAC Short Form Manual (v. 3.0)  2016, Trustees of [address], under license to [place], [place]. All rights reserved.   Interpretation of Tool: Represents clinically-significant functional categories (i.e.Activities of daily living).   Raw Score: 23  Adaleena Mooers A Vear Staton, PT  06/13/22

## 2022-06-13 NOTE — Discharge Instructions (Signed)
Wound care, left foot.  Remove current dressing.  Wash with wound wash.  Pat dry.  Pack plantar ulceration beneath the 1st metatarsal head with Aquacel Ag rope leaving a small tail for removal.  Cover with Mepilex 3 in x 3 in adhesive silicone border.  Change Monday Wednesday Friday.    Limit activities on feet.  Partial weight-bearing to the left heel in surgical shoe with PEG assist insole.  Follow up with Podiatry on Thursday for an offloading device.    Take antibiotics as prescribed.  Monitor for signs and symptoms of infection.  Call Podiatry office with any questions or concerns.

## 2022-06-13 NOTE — Care Coordination-Inpatient (Addendum)
[facility] CASE MANAGEMENT DISCHARGE NOTE :  Dominic Stephens was admitted on 06/09/2022 11:43 AM  with diagnosis of Acute kidney injury [facility]) [N17.9]  Diabetic foot infection (HCC) [Z61.096, L08.9].    Dominic Stephens will be discharged to: Destination: Home with no services  -not homebound, referral sent to [facility]ound [facility].    Discharge time arranged with (check all that apply): [facility], provider, patient, and primary RN    Hospital follow up appointment within 3-5 days with PCP, (Reape, Demetrios Isaacs, MD), arranged through unit coordinator? Yes    Follow up Important Medicare Message completed with date and time confirmed? Yes    Does patient plan to appeal? NO    Crutches supplies from lending closet.    Patient requested new prescription be sent to [facility] pharmacy so he does not need to take a stop on the way home. Utilizing [facility] for RX due to cost.   06/13/22 1102   Services At/After Discharge   Transition of Care Consult (CM Consult) Other  (Wound clinic)   Services At/After Discharge   (Wound clinic)   [facility] Information Provided? No   Mode of Transport at Discharge Self   Confirm Follow Up Transport Self   Condition of Participation: Discharge Planning   The Plan for Transition of Care is related to the following treatment goals: Home and back to work

## 2022-06-13 NOTE — Progress Notes (Signed)
[facility], [place]  CLINICAL NUTRITION    NUTRITION ASSESSMENT   Type of Assessment:  []  Initial    [x]  Follow Up        Current Diet Order: 75 gm CHO/meal; low fat, low chol/ 2gm Na   Food Allergies: No known food allergies     Anthropometrics:  Height: 195.6 cm (6\' 5" )      Admit Weight - Scale: 127 kg (280 lb)   Most Recent Weight - Scale: 127 kg (280 lb)   Body mass index is 33.2 kg/m.     Patient Vitals for the past 336 hrs:   Weight   06/09/22 1138 127 kg (280 lb)          IBW RANGE: 83.2-95.5kg  % IBW: 134%  UBW: Unknown (ranging from 267-290 per chart)   % UBW: 100%    Adjusted wt: 105kg  Weight History: Pt reports 20 lb weight loss however this is not consistent with weights available in chart. Pt with 9 lb weight loss since March 2024 however this also seems to be a higher weight than he has been recently. He did report weight loss last fall during hospitalization.    Wt Readings from Last 30 Encounters:   06/09/22 127 kg (280 lb)   06/05/22 128.8 kg (284 lb)   05/22/22 128.8 kg (284 lb)   05/10/22 128.8 kg (284 lb)   05/08/22 128.8 kg (284 lb)   04/22/22 131.7 kg (290 lb 6.4 oz)   04/17/22 131.1 kg (289 lb)   04/03/22 131.1 kg (289 lb)   04/01/22 130.2 kg (287 lb)   12/25/21 127.5 kg (281 lb)   11/07/21 121.1 kg (267 lb)   11/03/21 122.5 kg (270 lb)   04/26/19 (!) 139.7 kg (308 lb)       Daily Estimated Needs: based on []  Current wt   []   IBW   []  UBW   [x]  Adjusted wt  Calories (kcals):  3150-3675 30-35 kcal (diabetic foot ulcer)   Protein (g):  131-157 1.25-1.5 g    Fluid(ml):  3150 30 ml/kg      Pertinent Medications: Reviewed [x]      Labs:  Recent Labs     06/11/22  0648 06/11/22  1226   NA 141  --    K 4.0  --    CL 108*  --    CO2 22  --    BUN 24*  --    CREATININE 1.46* 1.33*   CALCIUM 8.7*  --    GLUCOSE 133*  --        Lab Results   Component Value Date/Time    LABA1C 7.4 (H) 06/10/2022 04:47 AM    LABA1C 10.6 (H) 10/31/2021 01:36 PM       Recent Labs     06/11/22  1638  06/11/22  2104 06/12/22  0733 06/12/22  1321 06/12/22  1635 06/12/22  2109 06/13/22  0726 06/13/22  1145   POCGLU 211 168 159 181 167 170 183 225        PMH: Reviewed [x]     Past Medical History:   Diagnosis Date    AKI (acute kidney injury) (HCC)     BPH (benign prostatic hyperplasia)     Chronic prostatitis     DM type 2 (diabetes mellitus, type 2) (HCC)     Hematuria     HTN (hypertension)     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 Would also like to trial chocolate Glucerna shake with lunch tray.  Patient Vitals for the past 200 hrs:   PO Meals Eaten (%)   06/13/22 0830 51 - 75%   06/12/22 1800 76 - 100%   06/12/22 1323 76 - 100%   06/12/22 0900 76 - 100%   06/11/22 1800 76 - 100%       Feeding Route:  [x]  Oral  []  Enteral  []  Parenteral    Gastrointestinal: (per nursing flowsheets)  Last Bowel Movement: 06/11/22  GI Symptoms:      Chewing/Swallowing Issues: None reported    Skin Integrity:   []  WDL      []  Pressure Injury:     [x]  Other:  Left diabetic foot ulcer     Edema: (per nursing flowsheets)   Peripheral Vascular (WDL): Exceptions to WDL  Edema: Left lower extremity  LLE Edema: Trace    NUTRITION DIAGNOSIS:    Increased nutrient needs(continues)     PES:    Increased nutrient needs related to diabetic foot ulcer as evidenced by established nutrient guidelines     INTERVENTION & PLAN   Food and/or Nutrient Delivery  Continue 75 gm CHO/meal; cardiac diet  Continue Juven BID  Trial chocolate Glucerna shake with dinner tray    Nutrition Education  Discussed diet  related to DM diet and cardiac diet restrictions.  Discussed oral nutrition supplement and their benefits    Coordination of Nutrition Care  Obtained dinner and breakfast choices and entered into Health touch  Coupon given for Juven as pt really enjoys and would like to continue with at home      Assessment Summary: Dominic Stephens is a 67 y.o. admitted with left diabetic foot infection and AKI, followed by podiatry.     Met with pt today at lunch. He is doing well and enjoys the Juven. Will trial Glucerna shake once daily in addition to Juven.     Goal(s):  Intake > 75% meals(meeting)  BS WNL  Intake >50% oral nutrition supplement TID (meeting; revised)    MONITORING & EVALUATION   Follow Up Date: 06/20/22    Priority Level of Care: []  High []  Moderate [x]  Low

## 2022-06-13 NOTE — Progress Notes (Signed)
Mobility: Patient did not ambulate today.     Pain: No indication of pain.     Diet/Diet Advancement: Reg. 5 carb Low Fat, Low Cholesterol, High Fiber, NAS; Adequate     Output/Voiding: Chronic Foley; Adequate    Last Bowel Movement: 06/11/22      Discharge: anticipated date: 06/13/22      Hourly Rounding Completed:      Patient Safety Verified During Hourly Rounding:      [x]  Bedrails x2    [x]  Gripper Socks   [x]  Bed locked in low position    [x]  Call light within reach   [x]  All personal items within reach   [x]  Bed/chair/personal alarm engaged   [x]  Activity info/level of assist documented on white board in room     Significant Event(s) During Shift:   This RN assumed care of patient after receiving report from Herma Mering., RN @ [phone]. Patient was seen by PT/OT and Pharmacy. This RN gave teaching on wound care to patient and provided information to patient regarding Wound Care Clinic referral and follow-up appointment.     Discharge RN, Audrie Gallus. completed discharge, and educated patient re: Discharge Summary. This RN verified removal and documentation of IVs/lines/drains by Audrie Gallus., RN Patient stable at time of discharge.

## 2022-06-14 LAB — CULTURE, BLOOD 1: Culture: NO GROWTH

## 2022-06-14 LAB — CULTURE, BLOOD 2: Culture: NO GROWTH

## 2022-06-14 NOTE — Care Coordination-Inpatient (Unsigned)
INITIAL POST HOSPITAL DISCHARGE CALL          TRANSITIONAL CARE      CALL MADE WITHIN 48 HOURS OF D/C:        DISCHARGE MEDICATIONS RECONCILED WITH PATIENTS HOME MEDICATION LIST:    FOLLOW UP APPT WITHIN 5 DAYS:           D/C DISPOSITION:      HOME CARE SERVICES ORDERED/STARTED?:    SUMMARY OF HOSPITALIZATION:    MED RECONCILIATION/DISCREPANCIES:    NEW MEDICATIONS:    MED CHANGES:    MEDICATIONS STOPPED:             REFERRAL TO SW NEEDED?      SUMMARY OF OUTREACH CALL:spoke with Dominic Stephens he is doing well he is able to change the foot dressing he states the wound looks clean there is no evidence of infection type drainage there is some blood noted he reprts his left great toe is slightly reddened, he is using the offloading boot as directed he Is not putting pressure on his foot.  We reviewed all d/c medications           PLAN:patient does plan to attend ov on Monday with Dr. Alfredia Ferguson at 9:50 AM.            No data to display

## 2022-06-17 ENCOUNTER — Ambulatory Visit: Admit: 2022-06-17 | Discharge: 2022-06-17 | Payer: MEDICARE | Attending: Internal Medicine | Primary: Internal Medicine

## 2022-06-17 DIAGNOSIS — I10 Essential (primary) hypertension: Secondary | ICD-10-CM

## 2022-06-17 NOTE — Progress Notes (Signed)
Subjective:   Dominic Stephens is a 67 y.o. male presenting for the following issues:    Hospital stay:  The patient is a 67 year old male with a history of type 2 diabetes, admitted due to a diabetic foot infection. The patient was evaluated by Podiatry and treated with Vancomycin and Zosyn for 4 days, followed by a transition to oral Zyvox and Cipro for 10 days. The patient was kept non-weight bearing on the left lower extremity. Blood cultures remained negative for 3 days. MRI changes did not indicate osteomyelitis.     The patient also experienced acute kidney injury, which improved with IV fluids, and creatinine levels returned to baseline at discharge (1.3).     For his type 2 diabetes, the patient was advised to resume prior-to-admission Glipizide.     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    The patient is wearing a boot on their left foot and is under the care of Podiatry. Despite being on antibiotics, the foot continues to show redness. However, the patient does not report any pain due to neuropathy.    Denies nausea  Denies vomiting  Denies fever  Denies chills  Denies chest pain  Denies shortness of breath    Patient Active Problem List   Diagnosis    Bladder stones    Renal stones    Urinary retention due to benign prostatic hyperplasia    Impotence of organic origin    Type 2 diabetes mellitus with hyperglycemia, without long-term current use of insulin (HCC)    Primary hypertension    Indwelling Foley catheter present    Finger infection    Diabetic foot infection (HCC)       Current Outpatient Medications   Medication Sig    linezolid (ZYVOX) 600 MG tablet Take 1 tablet by mouth 2 times daily for 10 days    ciprofloxacin (CIPRO) 500 MG tablet Take 1 tablet by mouth 2 times daily for 10 days    atenolol (TENORMIN) 50 MG tablet Take 1  tablet by mouth 2 times daily    glimepiride (AMARYL) 2 MG tablet Take 1 tablet by mouth 2 times daily (with meals)    blood glucose test strips (ONETOUCH ULTRA) strip Test sugars once daily E11.65    Blood Glucose Monitoring Suppl (ONE TOUCH ULTRA 2) w/Device KIT Test sugars once daily E11.65    ONE TOUCH ULTRASOFT LANCETS MISC Test sugars once daily E11.65     No current facility-administered medications for this visit.       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         CBC:   Lab Results   Component Value Date/Time    WBC 9.4 06/11/2022 06:48 AM    RBC 4.07 06/11/2022 06:48 AM    HGB 12.5 06/11/2022 06:48 AM    HCT 38.1 06/11/2022 06:48 AM    MCV 93.6 06/11/2022 06:48 AM  MCH 30.7 06/11/2022 06:48 AM    MCHC 32.8 06/11/2022 06:48 AM    RDW 12.3 06/11/2022 06:48 AM    PLT 168 06/11/2022 06:48 AM    MPV 9.4 06/11/2022 06:48 AM     CBC with Differential:    Lab Results   Component Value Date/Time    WBC 9.4 06/11/2022 06:48 AM    RBC 4.07 06/11/2022 06:48 AM    HGB 12.5 06/11/2022 06:48 AM    HCT 38.1 06/11/2022 06:48 AM    PLT 168 06/11/2022 06:48 AM    MCV 93.6 06/11/2022 06:48 AM    MCH 30.7 06/11/2022 06:48 AM    MCHC 32.8 06/11/2022 06:48 AM    RDW 12.3 06/11/2022 06:48 AM    NRBC 0.0 06/11/2022 06:48 AM    NRBC 0.00 06/11/2022 06:48 AM    MONOPCT 10 06/10/2022 04:47 AM    EOSPCT 2 06/10/2022 04:47 AM    BASOPCT 0 06/10/2022 04:47 AM    MONOSABS 1.1 06/10/2022 04:47 AM    LYMPHSABS 1.6 04/08/2022 11:45 AM    EOSABS 0.2 06/10/2022 04:47 AM    BASOSABS 0.0 06/10/2022 04:47  AM    DIFFTYPE AUTOMATED 06/10/2022 04:47 AM     CMP:    Lab Results   Component Value Date/Time    NA 141 06/11/2022 06:48 AM    K 4.0 06/11/2022 06:48 AM    CL 108 06/11/2022 06:48 AM    CO2 22 06/11/2022 06:48 AM    BUN 24 06/11/2022 06:48 AM    CREATININE 1.33 06/11/2022 12:26 PM    GFRAA 74 01/21/2019 11:14 AM    LABGLOM 53 06/11/2022 06:48 AM    LABGLOM >60 04/08/2022 11:45 AM    GLUCOSE 133 06/11/2022 06:48 AM    CALCIUM 8.7 06/11/2022 06:48 AM    BILITOT 0.78 06/09/2022 12:10 PM    ALKPHOS 90 06/09/2022 12:10 PM    ALKPHOS 92 01/21/2019 11:14 AM    AST 35 06/09/2022 12:10 PM    ALT 26 06/09/2022 12:10 PM     BMP:    Lab Results   Component Value Date/Time    NA 141 06/11/2022 06:48 AM    K 4.0 06/11/2022 06:48 AM    CL 108 06/11/2022 06:48 AM    CO2 22 06/11/2022 06:48 AM    BUN 24 06/11/2022 06:48 AM    CREATININE 1.33 06/11/2022 12:26 PM    CALCIUM 8.7 06/11/2022 06:48 AM    GFRAA 74 01/21/2019 11:14 AM    LABGLOM 53 06/11/2022 06:48 AM    LABGLOM >60 04/08/2022 11:45 AM    GLUCOSE 133 06/11/2022 06:48 AM        No results found for: "CHOL"  No results found for: "TRIG"  No results found for: "HDL"  No components found for: "LDLCHOLESTEROL", "LDLCALC"  No results found for: "VLDL"  No results found for: "CHOLHDLRATIO"      Objective:     BP 110/68 (Site: Right Upper Arm, Position: Sitting)   Pulse 62   Ht 1.956 m (6\' 5" )   Wt 127 kg (280 lb)   SpO2 96%   BMI 33.20 kg/m     General Appearance: alert and oriented to person, place and time, well-developed and well-nourished, in no acute distress  Pulmonary/Chest: clear to auscultation bilaterally- no wheezes, rales or rhonchi, normal air movement, no respiratory distress  Cardiovascular: normal rate, normal S1 and S2, no gallops, intact distal pulses, and no carotid bruits    L  foot: redness on 1st toe, no drainage, no tenderness    Assessment/Plan:     Lathen was seen today for follow-up.    Diagnoses and all orders for this visit:    Primary  hypertension    Diabetic foot infection (HCC)    Diabetes Mellitus (DM): The patient is advised to continue the intake of Glimepiride for their diabetes management. Regular follow-ups and checks of their blood glucose levels are necessary to monitor their response to the medication and the progression of their condition.    Hypertension (HTN): The patient should continue their regimen with Atenolol for the management of their hypertension. Regular monitoring of their blood pressure levels is necessary to ensure the efficacy of the treatment.    Foot Ulcer: The patient should continue their treatment with Cipro and Zyvox for their foot ulcer. It is recommended to follow up with a podiatrist for further assessment and treatment to ensure proper healing and prevent complications.    Medical decision making    Moderate number and complexity of problem:    2 or more stable chronic illnesses    Moderate risk of morbidity from additional diagnostic testing or treatment:    Prescription drug management    The patient's chart, labs, diagnostic tests, and medications were reviewed. This was followed by a face to face encounter with the patient that involved counseling, lab and diagnostic test review, coordination of care, detailing the pros and cons of the treatment plan, and answering all of the patient's questions. The patient's record was updated to accurately reflect the medication list and active problem list. A note documenting the encounter was written.     Total time spent for this encounter includes the following:    Preparing to see the patient (review of diagnostic tests, labs, and medications)  Obtaining/reviewing separately obtained history  Performing a medically appropriate examination and/or evaluation  Counseling/education of the patient/family  Ordering medications, tests, or procedures  Referring and communicating with other health care professionals  Documenting clinical information in the electronic  health record  Independently interpreting results and communicating results to the patient/family/caregiver  Care coordination     Total time spent: 20 minutes    No follow-up provider specified.  Future Appointments   Date Time Provider Department Center   06/20/2022  2:00 PM Marguerite Olea, DPM [facility]   07/01/2022  2:00 PM [facility] CARE [facility]OM 3 [id number]   08/12/2022  1:30 PM Reape, Demetrios Isaacs, MD NPSSN [facility]       Hubbard Robinson, MD  06/17/2022

## 2022-06-19 ENCOUNTER — Ambulatory Visit: Payer: MEDICARE | Attending: Foot & Ankle Surgery | Primary: Internal Medicine

## 2022-06-20 ENCOUNTER — Ambulatory Visit
Admit: 2022-06-20 | Discharge: 2022-06-20 | Payer: MEDICARE | Attending: Foot & Ankle Surgery | Primary: Internal Medicine

## 2022-06-20 DIAGNOSIS — E11621 Type 2 diabetes mellitus with foot ulcer: Secondary | ICD-10-CM

## 2022-06-20 DIAGNOSIS — L97502 Non-pressure chronic ulcer of other part of unspecified foot with fat layer exposed: Secondary | ICD-10-CM

## 2022-06-20 MED ORDER — LINEZOLID 600 MG PO TABS
600 | ORAL_TABLET | Freq: Two times a day (BID) | ORAL | 0 refills | Status: AC
Start: 2022-06-20 — End: 2022-06-30

## 2022-06-25 ENCOUNTER — Ambulatory Visit
Admit: 2022-06-25 | Discharge: 2022-06-25 | Payer: MEDICARE | Attending: Foot & Ankle Surgery | Primary: Internal Medicine

## 2022-06-25 DIAGNOSIS — E11621 Type 2 diabetes mellitus with foot ulcer: Secondary | ICD-10-CM

## 2022-06-25 DIAGNOSIS — L97502 Non-pressure chronic ulcer of other part of unspecified foot with fat layer exposed: Secondary | ICD-10-CM

## 2022-06-25 NOTE — Progress Notes (Signed)
Dominic Stephens     HPI:  This is a 67 year old uncontrolled diabetic male that presents today for post hospital follow-up.  He was admitted from 06/09/2022 to 06/13/2022 for a left lower extremity cellulitis stemming from a full-thickness ulceration beneath his 1st metatarsal.  MRI was negative for abscess or osteomyelitis during his admission.  A culture was obtained about a day after initiation of antibiotics without growth.  He has been discharged to home without services.  He apparently did not qualify from home dressing changes.  He has been packing the wound daily with the Exufiber Ag and applying Medihoney to the area with a dry dressing.  No nursing education was provided for the patient prior to his discharge in regards to wound care.  During his admission, he received 4 days of vancomycin and Zosyn and was discharged on linezolid.  He is tolerating this well and has a couple days remaining.  Redness improved, but remains to the great toe with associated edema.  There has been drainage from the plantar wound.  No fevers or chills.  His last hemoglobin A1c was 7.4 down from 10.6.  He received a new diabetic insole with the PEG assist and postoperative offloading shoe on the left side.  He has an old pair of diabetic shoes and insoles on the right.  He has been set up for new diabetic shoes and insoles through [facility].  He has returned to work immediately upon discharge.  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  No other concerns at today's visit.    Review of systems: Negative except as stated above.     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     Infection caused by Enterobacter cloacae     MRSA (methicillin resistant staph aureus) culture positive     Renal stones     Urinary bladder stone     Urinary catheter (Foley) change required     UTI (urinary tract infection)     UTI (urinary tract infection) due to urinary indwelling Foley catheter (HCC)     Wound cellulitis     diabetic        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          Current Outpatient Medications:     linezolid (ZYVOX) 600 MG tablet, Take 1 tablet by mouth 2 times daily for 10 days, Disp: 20 tablet, Rfl: 0    atenolol (TENORMIN) 50 MG tablet, Take 1 tablet by  mouth 2 times daily, Disp: 180 tablet, Rfl: 3    glimepiride (AMARYL) 2 MG tablet, Take 1 tablet by mouth 2 times daily (with meals), Disp: 180 tablet, Rfl: 1    blood glucose test strips (ONETOUCH ULTRA) strip, Test sugars once daily E11.65, Disp: 100 each, Rfl: 3    Blood Glucose Monitoring Suppl (ONE TOUCH ULTRA 2) w/Device KIT, Test sugars once daily E11.65, Disp: 1 kit, Rfl: 0    ONE TOUCH ULTRASOFT LANCETS MISC, Test sugars once daily E11.65, Disp: 100 each, Rfl: 3     Past Surgical History:   Procedure Laterality Date    ORTHOPEDIC SURGERY      Lumbar discecomy 2001        Family History   Problem Relation Age of Onset    Hypertension Mother     Diabetes Father     Diabetes Mother         Social History     Tobacco Use    Smoking status: Never     Passive exposure: Past    Smokeless tobacco: Never   Vaping Use    Vaping Use: Never used    Substance Use Topics    Alcohol use: Not Currently    Drug use: Not Currently        Vitals:       Physical Exam:  General: The patient is alert and oriented. NAD.  Psych: Please and cooperative with exam.  Vascular: Dorsalis pedis pulses are 2/4 bilaterally. Posterior tibial pulses are 2/4 bilaterally. Temperature gradient is warm to cool proximal to distal. There is mild edema to the lower extremities. Capillary refill is within normal limits.  Neuro: Sensation to light touch is absent with monofilament testing. No reproducible paraesthesias.   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 There is a reddish discoloration to the skin.  Skin is thin, shiny and atrophic.  Previous ulceration to the plantar right foot remains well healed.  On the left side, as demonstrated in the picture below, full-thickness ulceration remains.  Some hemorrhagic callus in the periphery with an adherent yellow exudate base.  The wound measures 1.8 cm x 0.8 cm with 0.8 cm in depth post debridement.  There is tunneling to bone.  Erythema remains to the great toe, but no fluctuance, warmth or significant edema.  Hemorrhagic callus beneath the left 5th metatarsal head.  No surrounding warmth, erythema, fluctuance or swelling. No other open lesions, ulcerations, macerations or suspicious lesions.  Pedal hair growth is absent.  No ecchymosis.  MSK: Muscle strength, tone and bulk are normal for age.  He has a semi-rigid plantar flexed 1st metatarsal head bilaterally with decreased ankle range of motion in dorsiflexion.  Hammertoes of the lesser digits.        ASSESSMENT/PLAN:       ICD-10-CM    1. 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 Diabetic polyneuropathy associated with type 2 diabetes mellitus (HCC)  E11.42       3. Plantar flexed metatarsal,  unspecified laterality  M21.6X9           I have reviewed the patient's chart for all new relevant clinical documentation, imaging and laboratory data.    This is a 67 year old diabetic male with neuropathy that presents today for postoperative  follow-up.  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  His MRI during admission was unremarkable for abscess formation.  Some very subtle increased signal intensity about the 1st MTPJ thought to be contributed from reactive and arthritic change.  His wound culture was negative without growth after a day or so of IV antibiotics.  He received 4 days of vancomycin and Zosyn.  Discharged on linezolid and has a couple days remaining.    On exam, continues with a full-thickness ulceration which requires debridement today.  The wound was debrided with a dermal curette, tissue Nipper and 15. Blade down to and including the subcutaneous tissue layer until healthy bleeding granular base was obtained free from infectious and devitalized tissue.  Hemostasis was achieved with silver nitrate and compression.  The wound was irrigated and dressed with mupirocin, Xeroform and a dry dressing.    Wound continues to probe quite deeply down to bone.  I think we need to extend his antibiotics.  I have contact [facility] for extension of his linezolid.  This is a difficult situation as the patient can not afford the medication.  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  Intermediate sensitivity to tetracyclines.  We will need to continue with the linezolid for now.    He does not qualify for home visiting nursing for dressing changes.  He will continue to try to pack this daily with the use of any cotton tip applicator.  I provided materials for the patient today.  He will use Medihoney in the periphery of the  wound and pack the central portion of the wound with the Exufiber Ag followed by dry sterile dressing.  He will do this after cleansing the area with a wound wash and thoroughly drying.  He will keep this dry and clean.  He was educated on the signs and symptoms of worsening infection and will present to the emergency department if these arise.    [facility].  He will continue offloading with the PEG assist insole with 1st ray cutout.  He will limit his activities with rest and elevation, staying off the foot as much as possible.    We discussed glycemic control as it pertains to the diabetic foot and wound healing.  Encouraged continued glycemic control.  Last HB A1c down to 7.4.    We discussed diabetic foot care and ulceration prevention.  I encouraged close observation of the feet with daily foot checks.  The patient should avoid barefoot walking.  We discussed glycemic control as it pertains to the diabetic foot.  Awaiting diabetic shoe gear.    He understands that he remains at high risk for lower extremity amputation.  He has continued questions about why the wound can not just be "stitched up".  He has asked this question multiple times in the past.  We once again had a lengthy discussion of the nature of the wound.  He would require reconstructive procedure with a local flap; however, the patient can not miss any time from work.  I would also consider lengthening of his gastroc versus TAL and peroneal longus to brevis tenodesis with or without 1st metatarsal elevating osteotomy.  Risks, benefits and alternatives as well as expected outcomes and recovery time frame with postoperative restrictions were reviewed  in detail.  He simply can not proceed with the procedure at this time due to work and financial requirements.    He was educated on the signs and symptoms of infection will contact the office with any questions or concerns or report to the  emergency department.  He understands that he remains at high risk for continued or worsening ulceration, infection and limb loss.  He will return in 1 week for recheck or sooner if needed.  Patient understands and is satisfied with the plan moving forward.  He will contact the office with any questions or concerns.    25 minutes was spent on patient care separate from the procedures before, during and after the encounter.    Please note that portions of this document were created using the M*Modal Fluency Direct dictation system.  Any inconsistencies or typographical errors may be the result of mis-transcription that persist in spite of proof-reading and should be addressed with the document creator.    Eliyas Suddreth Devoria Albe, DPM, FACFAS

## 2022-06-25 NOTE — Progress Notes (Incomplete)
Dominic Stephens     HPI:  This is a 67 year old uncontrolled diabetic male that presents today for post hospital follow-up.  He was admitted from 06/09/2022 to 06/13/2022 for a left lower extremity cellulitis stemming from a full-thickness ulceration beneath his 1st metatarsal.  MRI was negative for abscess or osteomyelitis during his admission.  A culture was obtained about a day after initiation of antibiotics without growth.  He has been discharged to home without services.  He apparently did not qualify from home dressing changes.  He has been packing the wound daily with the Exufiber Ag and applying Medihoney to the area with a dry dressing.  No nursing education was provided for the patient prior to his discharge in regards to wound care.  During his admission, he received 4 days of vancomycin and Zosyn and was discharged on linezolid.  He is tolerating this well and has a couple days remaining.  Redness improved, but remains to the great toe with associated edema.  There has been drainage from the plantar wound.  No fevers or chills.  His last hemoglobin A1c was 7.4 down from 10.6.  He received a new diabetic insole with the PEG assist and postoperative offloading shoe on the left side.  He has an old pair of diabetic shoes and insoles on the right.  He has been set up for new diabetic shoes and insoles through [facility].  He has returned to work immediately upon discharge.  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  No other concerns at today's visit.    Review of systems: Negative except as stated above.     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     Infection caused by Enterobacter cloacae     MRSA (methicillin resistant staph aureus) culture positive     Renal stones     Urinary bladder stone     Urinary catheter (Foley) change required     UTI (urinary tract infection)     UTI (urinary tract infection) due to urinary indwelling Foley catheter (HCC)     Wound cellulitis     diabetic        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          Current Outpatient Medications:     linezolid (ZYVOX) 600 MG tablet, Take 1 tablet by mouth 2 times daily for 10 days, Disp: 20 tablet, Rfl: 0    atenolol (TENORMIN) 50 MG tablet, Take 1 tablet by  mouth 2 times daily, Disp: 180 tablet, Rfl: 3    glimepiride (AMARYL) 2 MG tablet, Take 1 tablet by mouth 2 times daily (with meals), Disp: 180 tablet, Rfl: 1    blood glucose test strips (ONETOUCH ULTRA) strip, Test sugars once daily E11.65, Disp: 100 each, Rfl: 3    Blood Glucose Monitoring Suppl (ONE TOUCH ULTRA 2) w/Device KIT, Test sugars once daily E11.65, Disp: 1 kit, Rfl: 0    ONE TOUCH ULTRASOFT LANCETS MISC, Test sugars once daily E11.65, Disp: 100 each, Rfl: 3     Past Surgical History:   Procedure Laterality Date    ORTHOPEDIC SURGERY      Lumbar discecomy 2001        Family History   Problem Relation Age of Onset    Hypertension Mother     Diabetes Father     Diabetes Mother         Social History     Tobacco Use    Smoking status: Never     Passive exposure: Past    Smokeless tobacco: Never   Vaping Use    Vaping Use: Never used    Substance Use Topics    Alcohol use: Not Currently    Drug use: Not Currently        There were no vitals filed for this visit.      Physical Exam:  General: The patient is alert and oriented. NAD.  Psych: Please and cooperative with exam.  Vascular: Dorsalis pedis pulses are 2/4 bilaterally. Posterior tibial pulses are 2/4 bilaterally. Temperature gradient is warm to cool proximal to distal. There is mild edema to the lower extremities. Capillary refill is within normal limits.  Neuro: Sensation to light touch is absent with monofilament testing. No reproducible paraesthesias.   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 There is a reddish discoloration to the skin.  Skin is thin, shiny and atrophic.  Previous ulceration to the plantar right foot remains well healed.  On the left side, as demonstrated in the picture below, full-thickness ulceration remains.  Some hemorrhagic callus in the periphery with an adherent yellow exudate base.  The wound measures 1.5 cm x 0.8 cm with 0.8 cm in depth post debridement.  There is tunneling to bone.  Erythema remains to the great toe, but no fluctuance, warmth or significant edema.  Hemorrhagic callus beneath the left 5th metatarsal head.  No surrounding warmth, erythema, fluctuance or swelling. No other open lesions, ulcerations, macerations or suspicious lesions.  Pedal hair growth is absent.  No ecchymosis.  MSK: Muscle strength, tone and bulk are normal for age.  He has a semi-rigid plantar flexed 1st metatarsal head bilaterally with decreased ankle range of motion in dorsiflexion.  Hammertoes of the lesser digits.        ASSESSMENT/PLAN:     No diagnosis found.      I have reviewed the patient's chart for all new relevant clinical documentation, imaging and laboratory data.    This is a 67 year old diabetic male with neuropathy that presents today for postoperative follow-up.  Admitted from 06/09/22 to 06/13/2022 for a diabetic foot infection stemming from a chronic  ulceration beneath his 1st metatarsal secondary to his neuropathy and deformity.  His MRI during admission was unremarkable for abscess formation.  Some very subtle increased signal intensity about the 1st MTPJ thought to be contributed from reactive and arthritic change.  His wound culture was negative without growth after a day or so of  IV antibiotics.  He received 4 days of vancomycin and Zosyn.  Discharged on linezolid and has a couple days remaining.    On exam, continues with a full-thickness ulceration which requires debridement today.  The wound was debrided with a dermal curette, tissue Nipper and 15. Blade down to and including the subcutaneous tissue layer until healthy bleeding granular base was obtained free from infectious and devitalized tissue.  Hemostasis was achieved with silver nitrate and compression.  The wound was irrigated and dressed with mupirocin, Xeroform and a dry dressing.    Wound continues to probe quite deeply down to bone.  I think we need to extend his antibiotics.  I have contact [facility] for extension of his linezolid.  This is a difficult situation as the patient can not afford the medication.  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  Intermediate sensitivity to tetracyclines.  We will need to continue with the linezolid for now.    He does not qualify for home visiting nursing for dressing changes.  He will continue to try to pack this daily with the use of any cotton tip applicator.  I provided materials for the patient today.  He will use Medihoney in the periphery of the wound and pack the central portion of the wound with the Exufiber Ag followed by dry sterile dressing.  He will do this after cleansing the area with a wound wash and thoroughly drying.  He will keep this dry and clean.  He was educated on the signs and symptoms of worsening infection and will present to the emergency department if these  arise.    He [facility].  He will continue offloading with the PEG assist insole with 1st ray cutout.  He will limit his activities with rest and elevation, staying off the foot as much as possible.    We discussed glycemic control as it pertains to the diabetic foot and wound healing.  Encouraged continued glycemic control.  Last HB A1c down to 7.4.    We discussed diabetic foot care and ulceration prevention.  I encouraged close observation of the feet with daily foot checks.  The patient should avoid barefoot walking.  We discussed glycemic control as it pertains to the diabetic foot.  Awaiting diabetic shoe gear.    He understands that he remains at high risk for lower extremity amputation.  He has continued questions about why the wound can not just be "stitched up".  He has asked this question multiple times in the past.  We once again had a lengthy discussion of the nature of the wound.  He would require reconstructive procedure with a local flap; however, the patient can not miss any time from work.  I would also consider lengthening of his gastroc versus TAL and peroneal longus to brevis tenodesis with or without 1st metatarsal elevating osteotomy.  Risks, benefits and alternatives as well as expected outcomes and recovery time frame with postoperative restrictions were reviewed in detail.  He simply can not proceed with the procedure at this time due to work and financial requirements.    He was educated on the signs and symptoms of infection will contact the office with any questions or concerns or report to the emergency department.  He understands that he remains at high risk for continued or worsening ulceration, infection and limb loss.  He will return in  1 week for recheck or sooner if needed.  Patient understands and is satisfied with the plan moving forward.  He will contact the office with any questions or concerns.    25 minutes was spent on  patient care separate from the procedures before, during and after the encounter.    Please note that portions of this document were created using the M*Modal Fluency Direct dictation system.  Any inconsistencies or typographical errors may be the result of mis-transcription that persist in spite of proof-reading and should be addressed with the document creator.    Joury Allcorn Devoria Albe, DPM, FACFAS

## 2022-06-27 ENCOUNTER — Ambulatory Visit
Admit: 2022-06-27 | Discharge: 2022-06-27 | Payer: MEDICARE | Attending: Foot & Ankle Surgery | Primary: Internal Medicine

## 2022-06-27 DIAGNOSIS — E11621 Type 2 diabetes mellitus with foot ulcer: Secondary | ICD-10-CM

## 2022-06-28 NOTE — Progress Notes (Signed)
Dominic Stephens     HPI:  This is a 67 year old uncontrolled diabetic male that presents today for follow-up regarding a ulceration beneath his left 1st metatarsal head.  He has left his dressing intact since our visit earlier this week.  There has been some bloody drainage from the area.  Continues to walk on the foot with his postoperative shoe and PEG assist offloading insole.  He is trying to limit his activities.  He has crutches.  He continues with linezolid and tolerating this well.  No fevers or chills.  He is set up for wound care consultation on Monday.  He has dressing supplies at home.  His last hemoglobin A1c 7.4.  Denies any pain in notes significant numbness in his feet.  No other concerns at today's visit.      Review of systems: Negative except as stated above.     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     Infection caused by Enterobacter cloacae     MRSA (methicillin resistant staph aureus) culture positive     Renal stones     Urinary bladder stone     Urinary catheter (Foley) change required     UTI (urinary tract infection)     UTI (urinary tract infection) due to urinary indwelling Foley catheter (HCC)     Wound cellulitis     diabetic        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     Bladder stones 12/22/2017    Renal stones 12/22/2017    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          Current Outpatient Medications:     linezolid (ZYVOX) 600 MG tablet, Take 1 tablet by mouth 2 times daily for 10 days, Disp: 20 tablet, Rfl: 0    atenolol (TENORMIN) 50 MG tablet, Take 1 tablet by mouth 2 times daily, Disp: 180 tablet, Rfl: 3    glimepiride (AMARYL) 2 MG tablet, Take 1 tablet by mouth 2 times daily (with meals), Disp: 180 tablet, Rfl: 1    blood glucose test strips (ONETOUCH ULTRA) strip, Test sugars once daily E11.65, Disp: 100 each, Rfl: 3    Blood Glucose Monitoring Suppl (ONE TOUCH ULTRA 2) w/Device KIT, Test sugars once daily E11.65, Disp: 1 kit, Rfl: 0    ONE TOUCH ULTRASOFT LANCETS MISC, Test sugars once daily E11.65, Disp: 100 each, Rfl: 3     Past Surgical History:   Procedure Laterality Date    ORTHOPEDIC SURGERY      Lumbar discecomy 2001        Family History   Problem Relation Age of Onset  Hypertension Mother     Diabetes Father     Diabetes Mother         Social History     Tobacco Use    Smoking status: Never     Passive exposure: Past    Smokeless tobacco: Never   Vaping Use    Vaping Use: Never used   Substance Use Topics    Alcohol use: Not Currently    Drug use: Not Currently        Vitals:         Physical Exam:  General: The patient is alert and oriented. NAD.  Psych: Please and cooperative with exam.  Left foot:  Vascular: Dorsalis pedis and posterior tibial artery pulses are palpable, 2/4.  Temperature gradient is warm to cool proximal to distal.  Capillary refills within normal limits.  No significant edema.  Neuro: Sensation to light touch is absent with monofilament testing. No reproducible paraesthesias.   Derm:  Nails show mycotic change.  There is a reddish discoloration to the skin.  Skin is thin, shiny and atrophic.  There is a full-thickness ulceration remains beneath the left 1st metatarsal head.  No callus in the periphery.  The wound measures 1.4 cm x 0.8  cm with 1.1 cm.  There is tunneling along the medial aspect of the wound.  This does probe to bone along the medial aspect of the 1st MTPJ and tibial sesamoid.  Erythema and cellulitis resolved.  No warmth or swelling.  Hemorrhagic callus beneath the left 5th metatarsal head.  No surrounding warmth, erythema, fluctuance or swelling. No other open lesions, ulcerations, macerations or suspicious lesions.  Pedal hair growth is absent.  No ecchymosis.  MSK: Muscle strength, tone and bulk are normal for age.  He has a semi-rigid plantar flexed 1st metatarsal head with decreased ankle range of motion in dorsiflexion.  Hammertoes of the lesser digits.    ASSESSMENT/PLAN:       ICD-10-CM    1. 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 Diabetic polyneuropathy associated with type 2 diabetes mellitus (HCC)  E11.42       3. Plantar flexed metatarsal, unspecified laterality  M21.6X9           I have reviewed the patient's chart for all new relevant clinical documentation, imaging and laboratory data.    This is a 67 year old diabetic male with neuropathy that presents today for postoperative follow-up and dressing change.  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  His MRI during admission was unremarkable for abscess formation.  Some very subtle increased signal intensity about the 1st MTPJ thought to be contributed from reactive and arthritic change.  His wound culture was negative without growth after a day or so of IV antibiotics.  He received 4 days of vancomycin and Zosyn.  Discharged on linezolid and has continued with this for a 2nd course.    On exam, continues with a full-thickness ulceration.  No debridement was performed today as this was performed earlier this week.   The wound was irrigated and packed with Exufiber Ag with Medihoney in the periphery followed by 4 x 4 gauze and a Kerlix wrap and Ace bandage.  I recommended he change this on Saturday  with his partner Jacki Cones as instructed at our last visit.    Wound continues to probe quite deeply down to bone.  He understands that he remains at high risk for pedal amputation.  No signs of acute infection on today's exam.  He will continue with the linezolid until completed.  I do not think we need to extend this at this point.  He has a history of MRSA, but allergic to Bactrim and intermediate sensitivity to tetracyclines.    He has [facility]22.  He will continue offloading with the PEG assist insole with 1st ray cutout.  He will limit his activities with rest and elevation, staying off the foot as much as possible.  Crutches for gait assistance.    We discussed glycemic control as it pertains to the diabetic foot and wound healing.  Encouraged continued glycemic control.  Last HB A1c down to 7.4.    We discussed diabetic foot care and ulceration prevention.  I encouraged close observation of the feet with daily foot checks.  The patient should avoid barefoot walking.  We discussed glycemic control as it pertains to the diabetic foot.  Awaiting diabetic shoe gear.  At-risk foot care with debridement of his nails and calluses at next visit.    He was educated on the signs and symptoms of infection will contact the office with any questions or concerns or report to the emergency department.  He understands that he remains at high risk for continued or worsening ulceration, infection and limb loss.  Patient understands and is satisfied with the plan moving forward.  He will contact the office with any questions or concerns.  Return in 2 weeks for recheck or sooner if needed.    Please note that portions of this document were created using the M*Modal Fluency Direct dictation system.  Any  inconsistencies or typographical errors may be the result of mis-transcription that persist in spite of proof-reading and should be addressed with the document creator.    Wacey Zieger Devoria Albe, DPM, FACFAS

## 2022-07-01 ENCOUNTER — Inpatient Hospital Stay: Admit: 2022-07-01 | Discharge: 2022-07-02 | Payer: MEDICARE | Primary: Internal Medicine

## 2022-07-01 DIAGNOSIS — E11621 Type 2 diabetes mellitus with foot ulcer: Secondary | ICD-10-CM

## 2022-07-01 DIAGNOSIS — Z4800 Encounter for change or removal of nonsurgical wound dressing: Secondary | ICD-10-CM

## 2022-07-01 NOTE — Consults (Incomplete)
CONSULT    07/01/2022    History of Present Illness:     Dominic Stephens is a 67 y.o. male referred by Dr. 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     Infection caused by Enterobacter cloacae     MRSA (methicillin resistant staph aureus) culture positive     Renal stones     Urinary bladder stone     Urinary catheter (Foley) change required     UTI (urinary tract infection)     UTI (urinary tract infection) due to urinary indwelling Foley catheter (HCC)     Wound cellulitis     diabetic       Past Surgical History:   Procedure Laterality Date    ORTHOPEDIC SURGERY      Lumbar discecomy 2001       Allergies   Allergen Reactions    Methocarbamol Other (See Comments)    Sulfa Antibiotics Myalgia    Sulfamethoxazole-Trimethoprim Myalgia       Current Outpatient Medications   Medication Sig Dispense Refill    atenolol (TENORMIN) 50 MG tablet Take 1 tablet by mouth 2 times daily 180 tablet 3    glimepiride (AMARYL) 2 MG tablet Take 1 tablet by mouth 2 times daily (with meals) 180 tablet 1    blood glucose test strips (ONETOUCH ULTRA) strip Test sugars once daily E11.65 100 each 3    Blood Glucose Monitoring Suppl (ONE TOUCH ULTRA 2) w/Device KIT Test sugars once daily E11.65 1 kit 0    ONE TOUCH ULTRASOFT LANCETS MISC Test sugars once daily E11.65 100 each 3     No current facility-administered medications for this encounter.       Social History     Socioeconomic History    Marital status: Single     Spouse name: None    Number of children: None    Years of education: None    Highest education level: None   Tobacco Use    Smoking status: Never     Passive exposure: Past    Smokeless tobacco: Never   Vaping Use    Vaping Use: Never used   Substance and Sexual Activity    Alcohol use: Not  Currently    Drug use: Not Currently    Sexual activity: Not Currently     Social Determinants of Health     Financial Resource Strain: Low Risk  (04/01/2022)    Overall Financial Resource Strain (CARDIA)     Difficulty of Paying Living Expenses: Not hard at all   Food Insecurity: No Food Insecurity (06/10/2022)    Hunger Vital Sign     Worried About Running Out of Food in the Last Year: Never true     Ran Out of Food in the Last Year: Never true   Transportation Needs: No Transportation Needs (06/10/2022)    PRAPARE - Therapist, art (Medical): No     Lack of Transportation (Non-Medical): No   Housing Stability: Low Risk  (06/10/2022)    Housing Stability Vital Sign     Unable to Pay for Housing in the Last Year:  No     Number of Places Lived in the Last Year: 1     Unstable Housing in the Last Year: No        Family History   Problem Relation Age of Onset    Hypertension Mother     Diabetes Father     Diabetes Mother          Review of Systems       Vitals:    07/01/22 1419   BP: 122/68   Pulse: 68   Resp: 16   Temp: (!) 96.7 F (35.9 C)       Physical Exam     Wound 07/01/22 Foot Left;Plantar (Active)   Wound Image    07/01/22 1437   Wound Etiology Diabetic 07/01/22 1437   Dressing Status New dressing applied 07/01/22 1437   Wound Cleansed Wound cleanser 07/01/22 1437   Dressing/Treatment Other (comment) 07/01/22 1437   Wound Length (cm) 1.4 cm 07/01/22 1437   Wound Width (cm) 1 cm 07/01/22 1437   Wound Depth (cm) 0.7 cm 07/01/22 1437   Wound Surface Area (cm^2) 1.4 cm^2 07/01/22 1437   Wound Volume (cm^3) 0.98 cm^3 07/01/22 1437   Post-Procedure Length (cm) 1.4 cm 07/01/22 1437   Post-Procedure Width (cm) 1 cm 07/01/22 1437   Post-Procedure Depth (cm) 0.7 cm 07/01/22 1437   Post-Procedure Surface Area (cm^2) 1.4 cm^2 07/01/22 1437   Post-Procedure Volume (cm^3) 0.98 cm^3 07/01/22 1437   Distance Tunneling (cm) 1.9 cm 07/01/22 1437   Tunneling Position ___ O'Clock 9 07/01/22 1437   Wound  Assessment Pink/red 07/01/22 1437   Drainage Amount Moderate (25-50%) 07/01/22 1437   Drainage Description Serosanguinous 07/01/22 1437   Odor None 07/01/22 1437   Peri-wound Assessment Maceration;Hyperkeratosis (callous) 07/01/22 1437   Margins Defined edges 07/01/22 1437   Wound Thickness Description not for Pressure Injury Full thickness 07/01/22 1437   Number of days: 0        Procedure - Debridement:   The indication for debridement was reviewed with patient  Risks of procedure (bleeding, infection, pain) were discussed with patient and questions were answered.  Consent was obtained.    Wound cleansed with dermal cleanser.    Excisional Debridement:  Wound location:   Anesthesia: 4% lidocaine cream  Tissue removed: slough/devitalized tissue  Instrument: curette  Level of debridement: subcutaneous   Amount of bleeding:  Hemostasis: Pressure  Post debridement measurement:  *  Tolerated procedure well.    Orders Placed This Encounter   Procedures    DRESSING CHANGE MAJOR     Left foot:  Clean with dermal cleanser. Apply AMD packing gently into tunnel leaving tail out.  Cover with [employer].  Do 3 x week and PRN.  Fit for new PEG insole to place in his post op shoe.     Standing Status:   Standing     Number of Occurrences:   1        Assessment & Plan:    ICD-10-CM    1. 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 Diabetic foot infection (HCC)  E11.628     L08.9       3. Prominent metatarsal head of left foot  M21.6X2         Total time spent today was  This document was generated with the aid of voice recognition software.  Please be aware that there may be inadvertent transcription errors not identified and corrected by the author.     Duwaine Maxin. Romanoski-Newick, APRN

## 2022-07-02 NOTE — Other (Signed)
Patient seen for initial assessment of left plantar diabetic ulcer. Patient arrives ambulating with crutches, post op shoe with peg insert on LLE.  Patient is a 67 year old male with medical history including but not limited to uncontrolled CMII w/ last A1C on 5/13 of 7.4, HTN, chronic urinary cath. Patient was hospitalized from 5/12-5/16 for LLE cellulitis r/t diabetic ulcer on plantar aspect of L foot. He is followed by Dr. Nori Riis in podiatry. Patient presents today with 1/4" packing to wound, covered with gauze and secured with rolled gauze. Patient reports his girlfriend changed the dressing yesterday.Patient reports he has a hard time changing the dressing himself, and his girlfriend is not sure she is doing in properly. 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 Wound presents with callous and devitalized tissue, maceration to peri wound. Tunnel located at 9 o'clock with depth of 1.9 cm- probes to bone. No overt s/sx of infection. Patient reports he has three dosing remaining of Linezolid. Wound cleansed with dermal cleanser, irrigated with NS. NP in for exam, assisted by [facility]. Sharp debridement of devitalized tissue by NP, assisted by [facility]. Patient tolerated well. Spoke with patient on benefits of TCC casting. Patient is willing to try, has appt with Dr. Nori Riis on 6/12 and follow up at clinic on 6/17 for potential casting.   Wound care: AMD 1/4" packing strip lightly packed into tunnel leaving 1 cm exiting wound. Exufiber placed over wound, secondary dressing of Mepilex border foam. Dressing change demonstrated and reviewed with patient, patient acknowledged understanding. Patient fitted for new peg assist insert for post op shoe.   Education provided on ambulating with restrictions using crutches or knee scooter, off loading ulcer a all times, CBG control and effect of diabetes on wound healing, nutrition for wound healing - patient given  nutrition handout, edema control with elevation, healing process, foot care including proper inserts, shoes, and checking feet on a daily basis, skin care with moisturizing, s/sx of infection, and how/ when to notify clinic. All questions answered, patient in agreement with POC. Follow up in two weeks. Home dressing supplies ordered from [facility].        07/01/22 1437   Wound 07/01/22 Foot Left;Plantar   Date First Assessed: 07/01/22   Location: Foot  Wound Location Orientation: Left;Plantar   Wound Image     Wound Etiology Diabetic   Dressing Status New dressing applied   Wound Cleansed Wound cleanser   Dressing/Treatment Other (comment)   Wound Length (cm) 1.4 cm   Wound Width (cm) 1 cm   Wound Depth (cm) 0.7 cm   Wound Surface Area (cm^2) 1.4 cm^2   Wound Volume (cm^3) 0.98 cm^3   Post-Procedure Length (cm) 1.4 cm   Post-Procedure Width (cm) 1 cm   Post-Procedure Depth (cm) 0.7 cm   Post-Procedure Surface Area (cm^2) 1.4 cm^2   Post-Procedure Volume (cm^3) 0.98 cm^3   Distance Tunneling (cm) 1.9 cm   Tunneling Position ___ O'Clock 9   Wound Assessment Pink/red   Drainage Amount Moderate (25-50%)   Drainage Description Serosanguinous   Odor None   Peri-wound Assessment Maceration;Hyperkeratosis (callous)   Margins Defined edges   Wound Thickness Description not for Pressure Injury Full thickness

## 2022-07-05 ENCOUNTER — Encounter

## 2022-07-10 ENCOUNTER — Ambulatory Visit
Admit: 2022-07-10 | Discharge: 2022-07-10 | Payer: MEDICARE | Attending: Foot & Ankle Surgery | Primary: Internal Medicine

## 2022-07-10 ENCOUNTER — Inpatient Hospital Stay: Admit: 2022-07-10 | Payer: MEDICARE | Primary: Internal Medicine

## 2022-07-10 DIAGNOSIS — E11621 Type 2 diabetes mellitus with foot ulcer: Secondary | ICD-10-CM

## 2022-07-11 NOTE — Progress Notes (Signed)
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      He has done fairly well since our last visit.  He has completed a 2nd round of linezolid.  He has now been off antibiotics for about a week.  He is feeling well with no fevers or chills.  Denies any pain in the foot.      Tentative plan for total contact casting at the direction of the [facility] starting next week.  He was upset that the wound care center would not see him 3 times weekly for dressing changes.  He was given some supplies and instructions on local wound care, but still unsure what exactly he is supposed to do with the wound although able to recite their wound care instructions.  He reports that he has not open the box that was mailed to his home.  He did have his friend Lawson Fiscal pack the wound with the AMD packing strips.  Then placed a Band-Aid over the area.  This was few days ago.  There has been some drainage.  He is using some mole skin over calluses to the plantar soles of his feet.  He is requesting debridement of his mycotic nails and plantar callus.  His last hemoglobin A1c was 7.4.  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  He presents today with crutches and his PEG assist offloading insole.  He is completed new radiographs of the left foot and is here for review.  No other concerns at today's visit.    PCP last seen 06/17/2022      Review of systems: Negative except as stated above.     Allergies   Allergen Reactions    Methocarbamol Other (See Comments)    Sulfa Antibiotics Myalgia    Sulfamethoxazole-Trimethoprim Myalgia        Past Medical History:   Diagnosis Date    AKI (acute kidney injury) (HCC)     BPH (benign prostatic hyperplasia)     Chronic prostatitis     DM type 2 (diabetes mellitus, type 2) (HCC)     Hematuria     HTN (hypertension)     Hydronephrosis due to obstruction of  bladder     Infection caused by Enterobacter cloacae     MRSA (methicillin resistant staph aureus) culture positive     Renal stones     Urinary bladder stone     Urinary catheter (Foley) change required     UTI (urinary tract infection)     UTI (urinary tract infection) due to urinary indwelling Foley catheter (HCC)     Wound cellulitis     diabetic        Patient Active Problem List    Diagnosis Date Noted    Diabetic ulcer of left midfoot associated with type 2 diabetes mellitus, 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 (HCC) 07/01/2022    Prominent metatarsal head of left foot 07/01/2022    Diabetic foot infection (HCC) 06/09/2022    Indwelling Foley catheter present 05/08/2022    Finger infection 05/08/2022    Primary hypertension 11/13/2021    Type 2 diabetes mellitus with hyperglycemia, without long-term current use of insulin (HCC) 11/07/2021    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    Bladder stones 12/22/2017    Renal stones  12/22/2017    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          Current Outpatient Medications:     atenolol (TENORMIN) 50 MG tablet, Take 1 tablet by mouth 2 times daily, Disp: 180 tablet, Rfl: 3    glimepiride (AMARYL) 2 MG tablet, Take 1 tablet by mouth 2 times daily (with meals), Disp: 180 tablet, Rfl: 1    blood glucose test strips (ONETOUCH ULTRA) strip, Test sugars once daily E11.65, Disp: 100 each, Rfl: 3    Blood Glucose Monitoring Suppl (ONE TOUCH ULTRA 2) w/Device KIT, Test sugars once daily E11.65, Disp: 1 kit, Rfl: 0    ONE TOUCH ULTRASOFT LANCETS MISC, Test sugars once daily E11.65, Disp: 100 each, Rfl: 3     Past Surgical History:   Procedure Laterality Date    ORTHOPEDIC SURGERY      Lumbar discecomy 2001        Family History   Problem Relation Age of Onset    Hypertension Mother     Diabetes Father     Diabetes Mother         Social History     Tobacco Use    Smoking status: Never     Passive exposure: Past    Smokeless tobacco:  Never   Vaping Use    Vaping Use: Never used   Substance Use Topics    Alcohol use: Not Currently    Drug use: Not Currently        Vitals:    07/10/22 1440   BP: 130/60         Physical Exam:  General: The patient is alert and oriented. NAD.  Psych: Please and cooperative with exam.  Vascular: Dorsalis pedis and posterior tibial artery pulses are palpable, 2/4 bilaterally.  Temperature gradient is warm to cool proximal to distal.  Capillary refills within normal limits.  No significant edema.  Neuro: Sensation to light touch is absent with monofilament testing. No reproducible paraesthesias.   Derm:  All 10 nails are thickened, elongated and full of subungual debris.  No erythema or signs of secondary infection.    There is plantar callus beneath the plantar medial aspect of the right great toe, bilateral 5th metatarsal heads and right 1st metatarsal head without ulceration upon debridement.    There is a reddish discoloration to the skin.  Skin is thin, shiny and atrophic.  Pedal hair growth is absent.    There is a full-thickness ulceration remains beneath the left 1st metatarsal head as seen in the picture below.  The periwound tissue is severely macerated.  Serous drainage is noted.  No purulence.  No surrounding erythema, warmth or swelling.  I am unable to probe to bone today.  The ulcer measures 1 point 2 cm x 1 cm with 0.6 cm in depth pre debridement and 1.6 cm x 1.4 cm with 0.6 cm in depth post debridement.  There is adherent fibrotic tissue within the wound bed which is debrided to healthy bleeding granular tissue.  No exposed tendon or nor bone.  There is mild callus in the   Periphery of the wound.  No other open lesions, ulcerations, macerations or suspicious lesions.            MSK: Muscle strength, tone and bulk are normal for age.  He has a semi-rigid plantar flexed 1st metatarsal head with decreased ankle range of motion in dorsiflexion.  Hammertoes of the lesser digits.    Radiographs:  XR  FOOT  LEFT (MIN 3 VIEWS)  Order: [id number]  Status: Final result       Visible to patient: Yes (seen)       Next appt: 07/15/2022 at 03:00 PM in Wound Ostomy ([facility] RO[facility] 3)       Dx: Diabetic ulcer of other part of foot ...    0 Result Notes  Details    Reading Physician Reading Date Result Priority   Dennison Mascot, MD  [phone] 07/10/2022      Narrative & Impression  EXAM:  XR FOOT LT MIN 3 V     HISTORY:  Left foot pain     COMPARISON:  06/09/2022     TECHNIQUE:  THREE VIEWS OF THE left FOOT.     FINDINGS:  A mild hallux valgus deformity is seen. There is slight hammertoe deformities. There is no fracture or dislocation. The bone density is unremarkable. Calcaneal plantar and Achilles spurs are seen. The joint intervals are intact. Soft tissues are unremarkable.     IMPRESSION:  Degenerative change with no evidence for fracture or dislocation.              Specimen Collected: 07/10/22 14:39 EDT Last Resulted: 07/10/22 14:47 EDT             ASSESSMENT/PLAN:       ICD-10-CM    1. Diabetic ulcer of other part of foot associated with type 2 diabetes mellitus, with fat layer exposed, unspecified laterality (HCC)  E11.621 DEBRIDEMENT, SKIN, SUB-Q TISSUE,=<20 SQ CM    L97.502       2. Diabetic polyneuropathy associated with type 2 diabetes mellitus (HCC)  E11.42 DEBRIDEMENT OF NAILS, 6 OR MORE     TRIM HYPERKERATOTIC SKIN LESION,2-4      3. Plantar flexed metatarsal, unspecified laterality  M21.6X9       4. Onychomycosis  B35.1 DEBRIDEMENT OF NAILS, 6 OR MORE      5. Foot callus  L84 TRIM HYPERKERATOTIC SKIN LESION,2-4          I have reviewed the patient's chart for all new relevant clinical documentation, imaging and laboratory data.    New films of the left foot were obtained and reviewed with the patient.  These were done nonweightbearin to better assess for osteomyelitis.  Alignment is difficult to assess.  No lytic or erosive changes about the 1st MTPJ.  Soft tissues are unremarkable.  No radiographic  evidence of osteomyelitis.    On exam, severely macerated wound his.  His packing was removed.  He has not been following the wound care instructions provided during his wound care consultation at [facility].  He is able to recite back his instructions which were documented in the APR and no, but has not opened up the box that was mailed to his house.  We reviewed the nature of his wound and ideal wound care regimen.  A Band-Aid is simply not enough to absorb some of the drainage coming out of his wound.  He is instructed to pack this with AMD packing, followed by the Exufiber Ag patch with a Mepilex adhesive absorptive dressing.  He understands these instructions.  They are planning on total contact casting to be initiated next week.  I will defer to the Wound Care Center for this.    The wound does require debridement today.  The ulceration was debrided with a tissue Nipper, dermal curette and 15. Blade down to and including the subcutaneous tissue until  healthy bleeding granular base was obtained.  Hemostasis was achieved with compression and silver nitrate.  The wound was irrigated and packed with silver wick followed by Felicie Morn Ag and a dry sterile dressing.  Secured with a Coban wrap.  I do not see an indication for antibiotics at this time.  No evidence of acute infection.    The patient understands that he remains at high risk for amputation.  He will continue to follow up with the Wound Care Center.    All 10 nails were debrided in both thickness and in length as medically necessary to prevent secondary infection.  A total of 4 callus lesions to the plantar foot were debrided with a 15. Blade.  No underlying ulceration.  We will continue with his diabetic shoe gear on the right side and postoperative PEG assist offloading on the left until the total contact casting is initiated.    Reiterated diabetic foot care and ulceration prevention.  Continue with glycemic control.  Close observation of  the feet with daily foot checks and avoidance of barefoot walking.    He was educated on the signs and symptoms of infection will contact the office with any questions or concerns or report to the emergency department.  He understands that he remains at high risk for continued or worsening ulceration, infection and limb loss.  Patient understands and is satisfied with the plan moving forward.  He will contact the office with any questions or concerns.  Follow up with me as needed at this point with initiation of total contact casting.    Please note that portions of this document were created using the M*Modal Fluency Direct dictation system.  Any inconsistencies or typographical errors may be the result of mis-transcription that persist in spite of proof-reading and should be addressed with the document creator.    Ziyanna Tolin Devoria Albe, DPM, FACFAS

## 2022-07-15 ENCOUNTER — Inpatient Hospital Stay: Admit: 2022-07-15 | Discharge: 2022-07-16 | Payer: MEDICARE | Primary: Internal Medicine

## 2022-07-15 DIAGNOSIS — Z4800 Encounter for change or removal of nonsurgical wound dressing: Secondary | ICD-10-CM

## 2022-07-15 NOTE — Other (Signed)
Patient seen for follow up assessment of left plantar diabetic ulcer. Patient arrives ambulating with crutches, post op shoe with peg insert on LLE.  Patient reports he completed his antibiotic therapy. Patient presents today with 1/4" packing to wound, covered with gauze and secured with rolled gauze and ACE bandage. Wound presents with callous and devitalized tissue,  smaller measurements,  tunnel located at 9 o'clock with decreased depth. . No overt s/sx of infection. Wound cleansed with dermal cleanser.  NP in for exam, assisted by [facility]. Sharp debridement of devitalized tissue by NP, assisted by [facility]. Patient tolerated well.   Wound care: Promogran Prisma lightly packed into tunnel and wound bed, secondary dressing of Mepilex border foam. Application of TCC as per documented below per manufacturers guidelines.    Education provided on ambulating with restrictions using crutches or knee scooter, off loading ulcer a all times, CBG control and effect of diabetes on wound healing, nutrition for wound healing,  edema control with elevation, healing process, foot care including proper inserts, shoes, and checking feet on a daily basis, skin care with moisturizing, s/sx of infection, and how/ when to notify clinic. All questions answered, patient in agreement with POC. Follow up appt on Wed for follow up to initial casting.     NAME:  Dominic Stephens  DATE OF BIRTH:  [birth year]  MEDICAL RECORD NUMBER:  [id number]  DATE:  07/15/2022    Goal: Patient will maintain integrity of cast, avoid mobility hazards, and report complications that may occur (foul odor, pain, numbness, cracked cast).    Removed old contact cast, if indicated, and washed extremity with dermal cleanser. Patient tolerated well, skin intact from removal of cast.    Applied ordered dressing.    TCC applied to LLE extremity as ordered by Valarie Cones, NP.    Applied cast per Manufacturer Guidelines. Pt tolerated well.     Instructed patient to  report to health care provider, including wound care center, any back pain, hip pain, or leg pain, numbness of toes, or any odor coming from the cast.     Instructed patient not to stick any foreign objects down into cast.     Instructed patient to utilize assistive devices(crutches, cane or walker) as ordered. Instructed patient to continue offloading as directed.     Electronically signed by Dietrich Pates, RN on 07/15/2022 at 4:29 PM       07/15/22 1507   Wound 07/01/22 Foot Left;Plantar   Date First Assessed: 07/01/22   Location: Foot  Wound Location Orientation: Left;Plantar   Wound Image     Wound Etiology Diabetic   Dressing Status New dressing applied   Wound Cleansed Wound cleanser   Dressing/Treatment Collagen with Ag;Foam   Offloading for Diabetic Foot Ulcers Total contact cast   Wound Length (cm) 0.7 cm   Wound Width (cm) 0.7 cm   Wound Depth (cm) 0.5 cm   Wound Surface Area (cm^2) 0.49 cm^2   Change in Wound Size % (l*w) 65   Wound Volume (cm^3) 0.245 cm^3   Wound Healing % 75   Post-Procedure Length (cm) 0.7 cm   Post-Procedure Width (cm) 0.7 cm   Post-Procedure Depth (cm) 0.5 cm   Post-Procedure Surface Area (cm^2) 0.49 cm^2   Post-Procedure Volume (cm^3) 0.245 cm^3   Distance Tunneling (cm) 1.2 cm   Tunneling Position ___ O'Clock 9   Wound Assessment Pink/red   Drainage Amount Small (< 25%)   Drainage Description Serosanguinous   Odor None  Peri-wound Assessment Hyperkeratosis (callous)   Margins Defined edges   Wound Thickness Description not for Pressure Injury Full thickness

## 2022-07-15 NOTE — Progress Notes (Incomplete)
Wound Care Progress Note    07/15/2022    HPI    Dominic Stephens is a 67 y.o. male is here for followup of diabetic ulcer to left plantar foot.  Patient is receiving wound care assisted by a friend.  Currently packing with AMD and using a silicone border dressing.  His friend recently change the packing on Friday but due to movement of the dressing back outer dressing was changed yesterday.  He has a Post-op shoe with offloading peg insole he is using.  He feels that it is causing some discomfort to the lateral foot.  He reports he is using crutches with ambulation however upon discussion is still weight bearing.  He reports he did open the box of supplies ordered for him and he is utilizing those.      Past Medical History:   Diagnosis Date    AKI (acute kidney injury) (HCC)     BPH (benign prostatic hyperplasia)     Chronic prostatitis     DM type 2 (diabetes mellitus, type 2) (HCC)     Hematuria     HTN (hypertension)     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     Infection caused by Enterobacter cloacae     MRSA (methicillin resistant staph aureus) culture positive     Renal stones     Urinary bladder stone     Urinary catheter (Foley) change required     UTI (urinary tract infection)     UTI (urinary tract infection) due to urinary indwelling Foley catheter (HCC)     Wound cellulitis     diabetic        Allergies   Allergen Reactions    Methocarbamol Other (See Comments)    Sulfa Antibiotics Myalgia    Sulfamethoxazole-Trimethoprim Myalgia       Review of Systems       Vitals:    07/15/22 1459   BP: 106/60   Pulse: 64   Resp: 18   Temp: 97.6 F (36.4 C)       Physical Exam     Wound 07/01/22 Foot Left;Plantar (Active)   Wound Image    07/01/22 1437   Wound Etiology Diabetic 07/15/22 1507   Dressing Status New dressing applied 07/15/22 1507   Wound Cleansed Wound cleanser 07/15/22 1507   Dressing/Treatment Other  (comment) 07/01/22 1437   Offloading for Diabetic Foot Ulcers Post op shoe;Other (comment) 07/15/22 1507   Wound Length (cm) 0.7 cm 07/15/22 1507   Wound Width (cm) 0.7 cm 07/15/22 1507   Wound Depth (cm) 0.5 cm 07/15/22 1507   Wound Surface Area (cm^2) 0.49 cm^2 07/15/22 1507   Change in Wound Size % (l*w) 65 07/15/22 1507   Wound Volume (cm^3) 0.245 cm^3 07/15/22 1507   Wound Healing % 75 07/15/22 1507   Post-Procedure Length (cm) 0.7 cm 07/15/22 1507   Post-Procedure Width (cm) 0.7 cm 07/15/22 1507   Post-Procedure Depth (cm) 0.5 cm 07/15/22 1507   Post-Procedure Surface Area (cm^2) 0.49 cm^2 07/15/22 1507   Post-Procedure Volume (cm^3) 0.245 cm^3 07/15/22 1507   Distance Tunneling (cm) 1.2 cm 07/15/22 1507   Tunneling Position ___ O'Clock 9 07/15/22 1507   Wound Assessment Pink/red 07/01/22 1437   Drainage Amount Moderate (25-50%) 07/01/22 1437   Drainage Description Serosanguinous 07/01/22 1437   Odor None 07/15/22 1507   Peri-wound Assessment Hyperkeratosis (callous) 07/15/22 1507   Margins Defined edges 07/15/22 1507   Wound Thickness Description not for  Pressure Injury Full thickness 07/15/22 1507   Number of days: 14        Orders Placed This Encounter   Procedures    Cast application     LLE:  TCC Per manufacturer's guidelines.     Standing Status:   Standing     Number of Occurrences:   1    DRESSING CHANGE MAJOR     Left foot:  Clean with dermal cleanser. Gently pack Promogran Prisma into ulcer on submet 1, cover with Mepilex Border.  Skin prep to bulla and use of Mepilex Border to cover this and calloused area on submet 2.  TCC Per manufacturer's guidelines.     Standing Status:   Standing     Number of Occurrences:   1         Assessment & Plan:    ICD-10-CM    1. 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 Prominent metatarsal head of left foot  M21.6X2       3. Type 2 diabetes mellitus  with hyperglycemia, without long-term current use of insulin (HCC)  E11.65           Total time spent today was     This document was generated with the aid of voice recognition software.  Please be aware that there may be inadvertent transcription errors not identified and corrected by the author.     Valarie Cones, APRN  [facility]

## 2022-07-16 NOTE — Telephone Encounter (Signed)
Signed form faxed to [facility].    No further action needed.

## 2022-07-16 NOTE — Telephone Encounter (Signed)
Incoming fax from [employer] and [employer] received for Sales executive.     This form was previously signed by patient's PCP, Dr. Kateri Mc, however [facility] is requesting that the form be signed by Dr. Alfredia Ferguson who had seen the patient on 06/17/22 for a hospital follow up.     Form placed in provider's office for signature.

## 2022-07-17 ENCOUNTER — Inpatient Hospital Stay: Admit: 2022-07-17 | Discharge: 2022-07-18 | Payer: MEDICARE | Primary: Internal Medicine

## 2022-07-17 DIAGNOSIS — E11621 Type 2 diabetes mellitus with foot ulcer: Secondary | ICD-10-CM

## 2022-07-17 DIAGNOSIS — Z4800 Encounter for change or removal of nonsurgical wound dressing: Secondary | ICD-10-CM

## 2022-07-17 NOTE — Progress Notes (Incomplete)
Wound Care Progress Note    07/17/2022    HPI    Dominic Stephens is a 67 y.o. 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     MRSA (methicillin resistant staph aureus) culture positive     Renal stones     Urinary bladder stone     Urinary catheter (Foley) change required     UTI (urinary tract infection)     UTI (urinary tract infection) due to urinary indwelling Foley catheter (HCC)     Wound cellulitis     diabetic        Allergies   Allergen Reactions    Methocarbamol Other (See Comments)    Sulfa Antibiotics Myalgia    Sulfamethoxazole-Trimethoprim Myalgia       Review of Systems       There were no vitals filed for this visit.    Physical Exam     Wound 07/01/22 Foot Left;Plantar (Active)   Wound Image    07/15/22 1507   Wound Etiology Diabetic 07/15/22 1507   Dressing Status New dressing applied 07/15/22 1507   Wound Cleansed Wound cleanser 07/15/22 1507   Dressing/Treatment Collagen with Ag;Foam 07/15/22 1507   Offloading for Diabetic Foot Ulcers Total contact cast 07/15/22 1507   Wound Length (cm) 0.5 cm 07/17/22 1526   Wound Width (cm) 0.7 cm 07/17/22 1526   Wound Depth (cm) 0.3 cm 07/17/22 1526   Wound Surface Area (cm^2) 0.35 cm^2 07/17/22 1526   Change in Wound Size % (l*w) 75 07/17/22 1526   Wound Volume (cm^3) 0.105 cm^3 07/17/22 1526   Wound Healing % 89 07/17/22 1526   Post-Procedure Length (cm) 0.5 cm 07/17/22 1526   Post-Procedure Width (cm) 0.5 cm 07/17/22 1526   Post-Procedure Depth (cm) 0.5 cm 07/17/22 1526   Post-Procedure Surface Area (cm^2) 0.25 cm^2 07/17/22 1526   Post-Procedure Volume (cm^3) 0.125 cm^3 07/17/22 1526   Distance Tunneling (cm) 1.2 cm 07/15/22 1507   Tunneling Position ___ O'Clock 9 07/15/22 1507    Wound Assessment Pink/red 07/15/22 1507   Drainage Amount Small (< 25%) 07/15/22 1507   Drainage Description Serosanguinous 07/15/22 1507   Odor None 07/15/22 1507   Peri-wound Assessment Hyperkeratosis (callous) 07/15/22 1507   Margins Defined edges 07/15/22 1507   Wound Thickness Description not for Pressure Injury Full thickness 07/15/22 1507   Number of days: 16        Procedure - Debridement:   The indication for debridement was reviewed with patient. Risks of procedure (bleeding, infection, pain) were discussed with patient and questions were answered.  Consent was obtained.     Wound cleansed with dermal cleanser    Excisional Debridement:  Wound location:  Anesthesia: 4% lidocaine cream  Tissue removed: slough/devitalized tissue  Instrument: curette  Level of debridement: subcutaneous   Amount of bleeding:  Hemostasis: Pressure  Post debridement measurement:  *  Patient tolerated procedure well.    Orders Placed This Encounter   Procedures    DRESSING CHANGE MAJOR     Left foot:  Clean with dermal cleanser. Gently pack Promogran to  ulcer base, cover with exufiber ensuring contact with wound, then Mepilex Border.  Do 3 x week and PRN.  Apply protective bandage to left lateral foot.  A Mepilex Border was applied today and can be kept in place for up to 7 days.    Wear Post-op shoe with offloading peg insole and use crutches with all ambulation.     Standing Status:   Standing     Number of Occurrences:   1        Assessment & Plan:    ICD-10-CM    1. 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 Prominent metatarsal head of left foot  M21.6X2       3. Type 2 diabetes mellitus with hyperglycemia, without long-term current use of insulin (HCC)  E11.65           Total time spent today was     This document was generated with the aid of voice recognition software.  Please be aware that there may be  inadvertent transcription errors not identified and corrected by the author.     Valarie Cones, APRN  [facility]

## 2022-07-17 NOTE — Other (Signed)
Patient seen for follow up assessment of left plantar diabetic ulcer. Patient arrives ambulating with crutches, post op shoe over TCC. Pt reports he's not sure he wants to continue with TCC d/t difficulty with amb/working and discomfort to pretibial area. TCC removed without difficulty; pt tolerated well. Wound presents with primarily devitalized tissue. No directly palpable/visible bone. No overt s/sx of infection. Wound cleansed with dermal cleanser.  NP in for exam, assisted by [facility]. Sharp debridement of devitalized tissue by NP, assisted by [facility]. Patient tolerated well. Pt re-educated re: offloading for wound healing, TCC vs post-op shoe/insole, healing process, risk of delayed wound healing without TCC. Pt choosing to not continue with TCC at this time.   Wound cleansed with dermal cleanser. Collagen applied to wound bed, covered with Exufiber for therapeutic drge management. Covered with Mepilex Border and secured with Coban. Dsgs to be changed 3x/week and prn compromise. Demonstration of dsg application performed for pt - states full understanding. Will order supplies for home use through DME. Pt instructed to wear Procare shoe with offloading peg insole at all times with WB and to continue amb with crutches. Pt to focus weight bearing on heel of foot to reduce pressure to wound area. All questions answered/pt agreeable with plan of care.      07/17/22 1526   Wound 07/01/22 Foot Left;Plantar   Date First Assessed: 07/01/22   Location: Foot  Wound Location Orientation: Left;Plantar   Wound Image     Wound Etiology Diabetic   Dressing Status New dressing applied   Wound Cleansed Wound cleanser   Dressing/Treatment Collagen;Other (comment);Foam;Coban/self-adherent bandages  (Promogran, Exufiber, Mepilex Border, Coban)   Offloading for Diabetic Foot Ulcers Post op shoe  (Procare shoe with offloading peg insole)   Wound Length (cm) 0.5 cm   Wound Width (cm) 0.7 cm   Wound Depth (cm) 0.3 cm   Wound Surface Area  (cm^2) 0.35 cm^2   Change in Wound Size % (l*w) 75   Wound Volume (cm^3) 0.105 cm^3   Wound Healing % 89   Post-Procedure Length (cm) 0.5 cm   Post-Procedure Width (cm) 0.5 cm   Post-Procedure Depth (cm) 0.5 cm   Post-Procedure Surface Area (cm^2) 0.25 cm^2   Post-Procedure Volume (cm^3) 0.125 cm^3   Wound Assessment Devitalized tissue;Granulation tissue   Drainage Amount Small (< 25%)   Drainage Description Serosanguinous   Odor None   Peri-wound Assessment Intact   Margins Defined edges   Wound Thickness Description not for Pressure Injury Full thickness

## 2022-07-22 ENCOUNTER — Inpatient Hospital Stay: Admit: 2022-07-22 | Discharge: 2022-07-22 | Payer: MEDICARE | Primary: Internal Medicine

## 2022-07-22 DIAGNOSIS — Z4800 Encounter for change or removal of nonsurgical wound dressing: Secondary | ICD-10-CM

## 2022-07-22 DIAGNOSIS — L97426 Non-pressure chronic ulcer of left heel and midfoot 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: Secondary | ICD-10-CM

## 2022-07-22 NOTE — Other (Signed)
Patient seen for follow up assessment of left plantar diabetic ulcer. Patient arrives ambulating with crutches, post op shoe w/ peg insert on LLE.  Patient presents with dressings in place since Fridays visit. Plantar foot wound remains unchanged. Patient has newly developed DTI distal lateral aspect of foot, deflated bulla with DTI on proximal lateral aspect of foot.  No overt s/sx of infection. Wounds cleansed with dermal cleanser.  NP in for exam, assisted by [facility]. Sharp debridement of devitalized tissue from plantar wound by NP, assisted by [facility]. Patient tolerated well. Patient  re-educated re: offloading for wound healing, TCC vs post-op shoe/insole, healing process, risk of delayed wound healing without TCC and risk of new wounds to lateral aspect of foot. Patient continues to decline application of TCC.    Wound dressing: Plantar:  Skin prep applied to peri wound.  Collagen applied to wound bed, secondary dressing of Exufiber for therapeutic drge management. Covered with Mepilex Border Foam.  To be changed by patient 3 x week./ prn compromise.   Lateral wounds: Betadine applied to both wounds, secondary dressing of Mepilex border form. Patient instructed to peel back dressing daily and reapply betadine.  Dressing changes demonstrated to patient, verbalized understanding. Patient  instructed to wear Procare shoe with offloading peg insole at all times with WB and to continue ambulation with crutches. Patient encouraged to obtain a knee scooter to use when not at work. Patient  to focus weight bearing on heel of foot to reduce pressure to  plantar wound and lateral wounds. Reinforced education on CBG control and the effect on wound healing, nutrition for wound healing, dressing changes and home dressing supplies,  s/sx of infection and how/ when to notify clinic. All questions answered. Patient in agreement with POC. Follow up in one week.        07/22/22 1409   Wound 07/01/22 Foot Left;Plantar   Date First  Assessed: 07/01/22   Location: Foot  Wound Location Orientation: Left;Plantar   Wound Image     Wound Etiology Diabetic   Dressing Status New dressing applied   Wound Cleansed Wound cleanser   Dressing/Treatment Collagen;Foam   Offloading for Diabetic Foot Ulcers Post op shoe  (w/ off loading peg insert)   Wound Length (cm) 0.5 cm   Wound Width (cm) 0.5 cm   Wound Depth (cm) 0.3 cm   Wound Surface Area (cm^2) 0.25 cm^2   Change in Wound Size % (l*w) 82.14   Wound Volume (cm^3) 0.075 cm^3   Wound Healing % 92   Post-Procedure Length (cm) 0.5 cm   Post-Procedure Width (cm) 0.5 cm   Post-Procedure Depth (cm) 0.5 cm   Post-Procedure Surface Area (cm^2) 0.25 cm^2   Post-Procedure Volume (cm^3) 0.125 cm^3   Wound Assessment Devitalized tissue;Granulation tissue   Drainage Amount Small (< 25%)   Drainage Description Serosanguinous   Odor None   Peri-wound Assessment Intact   Margins Defined edges   Wound Thickness Description not for Pressure Injury Full thickness   Wound 07/22/22 Foot Left;Lateral;Proximal deflated bulla and DTI   Date First Assessed: 07/22/22   Primary Wound Type: Other (comment)  Location: Foot  Wound Location Orientation: Left;Lateral;Proximal  Wound Description (Comments): deflated bulla and DTI   Wound Image    Wound Etiology Other  (Deflated bulla and DTI)   Dressing Status New dressing applied   Wound Cleansed Wound cleanser   Dressing/Treatment Betadine swabs/povidone iodine;Foam   Wound Length (cm) 1.9 cm   Wound Width (cm) 2.2 cm  Wound Surface Area (cm^2) 4.18 cm^2   Drainage Amount Small (< 25%)   Drainage Description Serosanguinous   Odor None   Peri-wound Assessment Blanchable erythema   Margins Undefined edges   Wound 07/22/22 Foot Left;Lateral;Distal   Date First Assessed: 07/22/22   Primary Wound Type: Pressure Injury  Location: Foot  Wound Location Orientation: Left;Lateral;Distal   Wound Image    Wound Etiology Deep tissue/Injury   Dressing Status New dressing applied   Wound Cleansed  Wound cleanser   Dressing/Treatment Betadine swabs/povidone iodine;Foam   Wound Length (cm) 1.5 cm   Wound Width (cm) 2 cm   Wound Surface Area (cm^2) 3 cm^2   Wound Assessment Purple/maroon   Drainage Amount None (dry)   Odor None   Peri-wound Assessment Blanchable erythema   Margins Undefined edges

## 2022-07-22 NOTE — Progress Notes (Signed)
Wound Care Progress Note    07/22/2022    HPI    Dominic Stephens is a 67 y.o. male is here for followup of diabetic ulcer to foot.  At last visit patient declined returning to a total contact often for use of the Post-op shoe with offloading peg insole.  He developed a blister to the lateral mid foot and has some callus to the lateral 5th met head.  Protective bandage was placed over these.  He is wearing crutches.  States he is walking on his lateral foot.  Is not checking his blood glucose levels.  Most recent A1c was 7.4 on 06/10/2022.  Continues to drive a bus.       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          Vitals:    07/22/22 1406   BP: 118/68   Pulse: 72   Resp: 18   Temp: 97.5 F (36.4 C)       Physical Exam  Vitals reviewed.   Constitutional:       General: He is not in acute distress.     Appearance: Normal appearance. He is obese. He is not ill-appearing.   HENT:      Head: Normocephalic and atraumatic.   Pulmonary:      Effort: Pulmonary effort is normal. No respiratory distress.   Musculoskeletal:      Left foot: Prominent metatarsal heads present.        Feet:    Feet:      Comments: The left foot is pink, warm  and appears well perfused.  Full-thickness ulceration to left submet 1 has moist red non granular base with some fibrinous tissue overlying.  Bone is not palpable.  There is a new area of dark purple non Blanche viable tissue to the left lateral 5th metatarsal head that is currently non fluctuant with intact skin.  Left lateral mid foot at area of previous blister has some detached pale tissue.  Appears intact under.  Both aspects are overa bony prominence with little SQ tissue under.  Surrounding tissue is Blanche oval without any erythema, ecchymosis, calor, purulence, lymphangitis.  Skin is overall dry and flaky.  Skin:     General: Skin is warm and dry.      Capillary Refill: Capillary refill takes less than 2 seconds.      Findings: No erythema.   Neurological:      Mental  Status: He is alert and oriented to person, place, and time.   Psychiatric:         Mood and Affect: Mood normal.         Behavior: Behavior normal.          Wound 07/01/22 Foot Left;Plantar (Active)   Wound Image    07/22/22 1409   Wound Etiology Diabetic 07/22/22 1409   Dressing Status New dressing applied 07/22/22 1409   Wound Cleansed Wound cleanser 07/22/22 1409   Dressing/Treatment Collagen;Foam 07/22/22 1409   Offloading for Diabetic Foot Ulcers Post op shoe 07/22/22 1409   Wound Length (cm) 0.5 cm 07/22/22 1409   Wound Width (cm) 0.5 cm 07/22/22 1409   Wound Depth (cm) 0.3 cm 07/22/22 1409   Wound Surface Area (cm^2) 0.25 cm^2 07/22/22 1409   Change in Wound Size % (l*w) 82.14 07/22/22 1409   Wound Volume (cm^3) 0.075 cm^3 07/22/22 1409   Wound Healing % 92 07/22/22 1409   Post-Procedure Length (cm) 0.5 cm 07/22/22 1409   Post-Procedure Width (cm) 0.5 cm 07/22/22 1409   Post-Procedure Depth (cm) 0.5 cm 07/22/22 1409   Post-Procedure Surface Area (cm^2) 0.25 cm^2 07/22/22 1409   Post-Procedure Volume (cm^3) 0.125 cm^3 07/22/22 1409   Distance Tunneling (cm) 1.2 cm 07/15/22 1507   Tunneling Position ___ O'Clock 9 07/15/22 1507   Wound  Assessment Devitalized tissue;Granulation tissue 07/22/22 1409   Drainage Amount Small (< 25%) 07/22/22 1409   Drainage Description Serosanguinous 07/22/22 1409   Odor None 07/22/22 1409   Peri-wound Assessment Intact 07/22/22 1409   Margins Defined edges 07/22/22 1409   Wound Thickness Description not for Pressure Injury Full thickness 07/22/22 1409   Number of days: 21       Wound 07/22/22 Foot Left;Lateral;Proximal deflated bulla and DTI (Active)   Wound Image   07/22/22 1409   Wound Etiology Other 07/22/22 1409   Dressing Status New dressing applied 07/22/22 1409   Wound Cleansed Wound cleanser 07/22/22 1409   Dressing/Treatment Betadine swabs/povidone iodine;Foam 07/22/22 1409   Wound Length (cm) 1.9 cm 07/22/22 1409   Wound Width (cm) 2.2 cm 07/22/22 1409   Wound Surface Area (cm^2) 4.18 cm^2 07/22/22 1409   Drainage Amount Small (< 25%) 07/22/22 1409   Drainage Description Serosanguinous 07/22/22 1409   Odor None 07/22/22 1409   Peri-wound Assessment Blanchable erythema 07/22/22 1409   Margins Undefined edges 07/22/22 1409   Number of days: 0       Wound 07/22/22 Foot Left;Lateral;Distal (Active)   Wound Image   07/22/22 1409   Wound Etiology Deep tissue/Injury 07/22/22 1409   Dressing Status New dressing applied 07/22/22 1409   Wound Cleansed Wound cleanser 07/22/22 1409   Dressing/Treatment Betadine swabs/povidone iodine;Foam 07/22/22 1409   Wound Length (cm) 1.5 cm 07/22/22 1409   Wound Width (cm) 2 cm 07/22/22 1409   Wound Surface Area (cm^2) 3 cm^2 07/22/22 1409   Wound Assessment Purple/maroon 07/22/22 1409   Drainage Amount None (dry) 07/22/22 1409   Odor None 07/22/22 1409   Peri-wound Assessment Blanchable erythema 07/22/22 1409   Margins Undefined edges 07/22/22 1409   Number of days: 0        Procedure - Debridement:   The indication for debridement was reviewed with patient. Risks of procedure (bleeding, infection, pain) were discussed with patient and questions were answered.  Consent was  obtained.     Wound cleansed with dermal cleanser    Excisional Debridement:  Wound  location:  Left foot  Anesthesia:  None neuropathic  Tissue removed: slough/devitalized tissue  Instrument: curette  Level of debridement: subcutaneous   Amount of bleeding:  Scant  Hemostasis: Pressure  Post debridement measurement:  0.5 x 0.5 cm.  Patient tolerated procedure well.    Orders Placed This Encounter   Procedures    VL DUP LOWER EXTREMITY ARTERIES LEFT     Standing Status:   Future     Standing Expiration Date:   07/22/2023     Order Specific Question:   Reason for exam:     Answer:   DM ulcer to left foot with dealyed healing, hx of infection. Hx uncontrolled DM. Developement of new areas of concern on left foot.    VL ABI BILATERAL LIMITED 1-2 LEVELS     Standing Status:   Future     Standing Expiration Date:   07/22/2023     Order Specific Question:   Reason for exam:     Answer:   DM ulcer to left foot with dealyed healing, hx of infection. Hx uncontrolled DM. Developement of new areas of concern on left foot.    DRESSING CHANGE MAJOR     Left plantar foot:  Clean with dermal cleanser. Gently pack Promogran to ulcer base, cover with exufiber ensuring contact with wound, then Mepilex Border.  Do 3 x week and PRN.  Left lateral foot:  Paint betadine to both areas, allow to dry. Do daily with pealing back of protective Mepilex Border and changing the dressing as needed.    Wear Post-op shoe with offloading peg insole and use crutches with all ambulation, weight bearing on the heel.     Standing Status:   Standing     Number of Occurrences:   1        Assessment & Plan:    ICD-10-CM    1. 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 Prominent metatarsal head of left foot  M21.6X2 VL DUP LOWER EXTREMITY ARTERIES LEFT     VL ABI BILATERAL  LIMITED 1-2 LEVELS      3. Type 2 diabetes mellitus with hyperglycemia, without long-term current use of insulin (HCC)  E11.65 VL DUP LOWER EXTREMITY ARTERIES LEFT     VL ABI BILATERAL LIMITED 1-2 LEVELS      4. Deep tissue injury  T14.8XXA VL DUP LOWER EXTREMITY ARTERIES LEFT     VL ABI BILATERAL LIMITED 1-2 LEVELS      5. Diabetic foot infection (HCC)  E11.628 VL DUP LOWER EXTREMITY ARTERIES LEFT    L08.9 VL ABI BILATERAL LIMITED 1-2 LEVELS        Plantar ulcer is without any real improvement with prominent metatarsal head which is factor in wound healing.  He did not want to return to a total contact cast at last visit and returned to the Post-op shoe with offloading peg insole however previously developed a bulla to the mid foot and since his last visit on Friday developed a deep tissue injury to the lateral 5th met head.  Upon discussion nursing evaluation he is ambulating on the lateral aspect of his foot within the postoperative shoe.  The PEG removal insole does not protect or offload non plantar based surfaces.  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  Area is currently intact however will likely evolve into an ulcer.  At this time utilize Betadine with protective dressing.  Discussed avoiding pressure to these areas.  I again reviewed that the postoperative shoe to help with the plantar surfaces not the lateral surfaces that he needs to be mind full of his foot with in any footwear and how he is resting his foot at work and home. He is requesting to return to his sneaker for which will not help offload the plantar aspect.  Again discussed TCC and pressure redistribution which can also help ulcers located on the lateral foot as well.  Does have an upcoming appointment with [facility] orthotics.  Continue to use crutches.   Reviewed impacts of prolonged pressure.  BG levels are improved however previously uncontrolled.  I recommend noninvasive vascular studies to evaluate  for arterial flow.  Patient was agreeable and these were ordered.  Reviewed signs and symptoms of infection and reasons to seek urgent evaluation.  Patient follow up in approximately 1 week.  Advised to call sooner with questions or concerns.  Patient is agreeable plan.  Total time spent today was 30 minutes.      This document was generated with the aid of voice recognition software.  Please be aware that there may be inadvertent transcription errors not identified and corrected by the author.     Valarie Cones, APRN  [facility]

## 2022-07-29 ENCOUNTER — Inpatient Hospital Stay: Admit: 2022-07-29 | Discharge: 2022-07-29 | Payer: MEDICARE | Primary: Internal Medicine

## 2022-07-29 DIAGNOSIS — Z4800 Encounter for change or removal of nonsurgical wound dressing: Secondary | ICD-10-CM

## 2022-07-29 DIAGNOSIS — E11621 Type 2 diabetes mellitus with foot ulcer: Secondary | ICD-10-CM

## 2022-07-29 NOTE — Other (Signed)
Pt presents for f/u of L foot wounds. Pt arrives amb with crutches; Procare shoe with offloading insole in place to L foot. Pt presents with dsgs in place to wounds. NP in to see pt, CWOCN assisted with exam. Pt not checking blood sugars. Pt also reports he's out of his diabetic medication for approx 1 week. Pt has multiple questions re: why his wound(s) have occurred and what is hindering their improvement and closure. Pt re-educated re: pressure relief vs offloading, TCC vs offloading insoles, diabetic effect on wound healing, healing process, delayed wound healing in chronic wounds. Pt states understanding.   L plantar foot wound presents with primarily devitalized tissue. Sharp debridement performed by NP, CWOCN assisted with procedure. Pt tolerated well. Underneath, wound is granular without palpable bone, tunneling or undermining. No s/s infection. L lateral foot distal DTI presents CDI with non-blanching purple/maroon discoloration. L lateral foot proximal DTI presents CDI with non-blanching purple discoloration. Adject blister presents deflated with superficial opening - clean/pink. No s/s infection.   Wounds cleansed with dermal cleanser. Saline moistened collagen applied to plantar wound bed. Betadine painted to DTIs. All wounds covered with Mepilex Borders. Pt to change plantar dsgs 3x/week and prn compromise. Lateral Mepilex Border to be pulled back daily so allow for more Betadine to be applied. Dressing changes demonstrated to patient, verbalized understanding. Patient  instructed to wear Procare shoe with offloading peg insole at all times with WB and to continue ambulation with crutches. Patient encouraged to obtain a knee scooter to use when not at work. Patient to focus weight bearing on heel of foot to reduce pressure to  plantar wound and lateral wounds. All questions answered. Patient in agreement with POC. Follow up in 2 weeks.      07/29/22 1514   Wound 07/01/22 Foot Left;Plantar   Date First  Assessed: 07/01/22   Location: Foot  Wound Location Orientation: Left;Plantar   Wound Image    Wound Etiology Diabetic   Dressing Status New dressing applied   Wound Cleansed Wound cleanser   Dressing/Treatment Collagen;Foam   Offloading for Diabetic Foot Ulcers Post op shoe  (w/ offloading Procare insole)   Wound Length (cm) 0.4 cm   Wound Width (cm) 0.4 cm   Wound Depth (cm) 0.2 cm   Wound Surface Area (cm^2) 0.16 cm^2   Change in Wound Size % (l*w) 88.57   Wound Volume (cm^3) 0.032 cm^3   Wound Healing % 97   Post-Procedure Length (cm) 0.5 cm   Post-Procedure Width (cm) 0.4 cm   Post-Procedure Depth (cm) 0.3 cm   Post-Procedure Surface Area (cm^2) 0.2 cm^2   Post-Procedure Volume (cm^3) 0.06 cm^3   Wound Assessment Devitalized tissue;Granulation tissue   Drainage Amount Moderate (25-50%)   Drainage Description Serosanguinous   Odor None   Peri-wound Assessment Intact   Margins Defined edges   Wound Thickness Description not for Pressure Injury Full thickness   Wound 07/22/22 Foot Left;Lateral;Distal   Date First Assessed: 07/22/22   Primary Wound Type: Pressure Injury  Location: Foot  Wound Location Orientation: Left;Lateral;Distal   Wound Image    Wound Etiology Deep tissue/Injury   Dressing Status New dressing applied   Wound Cleansed Wound cleanser   Dressing/Treatment Betadine swabs/povidone iodine;Foam   Wound Length (cm) 2.5 cm   Wound Width (cm) 1.7 cm   Wound Surface Area (cm^2) 4.25 cm^2   Change in Wound Size % (l*w) -41.67   Wound Assessment Purple/maroon   Drainage Amount None (dry)   Odor  None   Peri-wound Assessment Blanchable erythema   Wound Thickness Description not for Pressure Injury Full thickness   Wound 07/22/22 Foot Left;Lateral;Proximal deflated bulla and DTI   Date First Assessed: 07/22/22   Primary Wound Type: Other (comment)  Location: Foot  Wound Location Orientation: Left;Lateral;Proximal  Wound Description (Comments): deflated bulla and DTI   Wound Image    Wound Etiology  Other  (deflated blister and DTI)   Dressing Status New dressing applied   Wound Cleansed Wound cleanser   Dressing/Treatment Betadine swabs/povidone iodine;Foam   Wound Length (cm) 1.9 cm   Wound Width (cm) 0.7 cm   Wound Depth (cm) 0.1 cm   Wound Surface Area (cm^2) 1.33 cm^2   Change in Wound Size % (l*w) 68.18   Wound Volume (cm^3) 0.133 cm^3   Wound Assessment Pink/red;Purple/maroon   Drainage Amount None (dry)   Odor None   Peri-wound Assessment Blanchable erythema   Margins Flat/open edges   Wound Thickness Description not for Pressure Injury Full thickness

## 2022-07-29 NOTE — Progress Notes (Signed)
Wound Care Progress Note    07/29/2022    HPI    Dominic Stephens is a 67 y.o. male is here to followup of diabetic foot ulcer and deep tissue injury.  Patient arrives with crutches and is wearing Post-op shoe with offloading peg insole.  He is performing wound care using Promogran, exufiber and a silicone border dressing to the plantar foot ulcer.  Use of Betadine and protective dressing to lateral foot.  Noted to have minimal betadine to DTI when dressing was removed.  He reports he is trying to keep pressure off the lateral foot then driving.  He states he is using the crutches when he is walking longer distances.  Upon questioning, patient is not checking his blood sugar.  Also states has been out of his diabetic medication for approximately 1 week.  States needs to pick it up at the pharmacy.His last A1c was 7.4 in May.  Previous reading of 10.6 last October.  Patient has declined re-application of the total contact cast.    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          Vitals:    07/29/22 1500   BP: 112/70   Pulse: 68   Resp: 16   Temp: 97.9 F (36.6 C)       Physical Exam  Vitals reviewed.   Constitutional:       General: He  is not in acute distress.     Appearance: Normal appearance. He is obese. He is not ill-appearing.   HENT:      Head: Normocephalic and atraumatic.   Pulmonary:      Effort: Pulmonary effort is normal. No respiratory distress.   Musculoskeletal:      Left lower leg: No edema.      Left foot: Prominent metatarsal heads present.        Feet:    Feet:      Comments: The left foot is pink, warm and appears well perfused.  Full-thickness ulceration to left submet 1 has some devitalized tissue.  The base is moist red non-granular tissue.  Some periwound callous.  Bone is not visible or palpable.  Left lateral 5th met head with continued dark purple tissue with intact skin.  Lateral left mid foot with small open area from blister and dark purple tissue intact anterior.  There is no erythema, malodor, purulent drainage, lymphangitis, or calor.   Skin:  General: Skin is warm and dry.      Capillary Refill: Capillary refill takes less than 2 seconds.      Findings: No erythema.   Neurological:      Mental Status: He is alert and oriented to person, place, and time.   Psychiatric:         Mood and Affect: Mood normal.         Behavior: Behavior normal.        Wound 07/01/22 Foot Left;Plantar (Active)   Wound Image   07/29/22 1514   Wound Etiology Diabetic 07/29/22 1514   Dressing Status New dressing applied 07/29/22 1514   Wound Cleansed Wound cleanser 07/29/22 1514   Dressing/Treatment Collagen;Foam 07/29/22 1514   Offloading for Diabetic Foot Ulcers Post op shoe 07/29/22 1514   Wound Length (cm) 0.4 cm 07/29/22 1514   Wound Width (cm) 0.4 cm 07/29/22 1514   Wound Depth (cm) 0.2 cm 07/29/22 1514   Wound Surface Area (cm^2) 0.16 cm^2 07/29/22 1514   Change in Wound Size % (l*w) 88.57 07/29/22 1514   Wound Volume (cm^3) 0.032 cm^3 07/29/22 1514   Wound Healing % 97 07/29/22 1514   Post-Procedure Length (cm) 0.5 cm 07/29/22 1514   Post-Procedure Width (cm) 0.4 cm 07/29/22 1514   Post-Procedure Depth (cm) 0.3 cm 07/29/22 1514    Post-Procedure Surface Area (cm^2) 0.2 cm^2 07/29/22 1514   Post-Procedure Volume (cm^3) 0.06 cm^3 07/29/22 1514   Distance Tunneling (cm) 1.2 cm 07/15/22 1507   Tunneling Position ___ O'Clock 9 07/15/22 1507   Wound Assessment Devitalized tissue;Granulation tissue 07/29/22 1514   Drainage Amount Moderate (25-50%) 07/29/22 1514   Drainage Description Serosanguinous 07/29/22 1514   Odor None 07/29/22 1514   Peri-wound Assessment Intact 07/29/22 1514   Margins Defined edges 07/29/22 1514   Wound Thickness Description not for Pressure Injury Full thickness 07/29/22 1514   Number of days: 28       Wound 07/22/22 Foot Left;Lateral;Proximal deflated bulla and DTI (Active)   Wound Image   07/29/22 1514   Wound Etiology Other 07/29/22 1514   Dressing Status New dressing applied 07/29/22 1514   Wound Cleansed Wound cleanser 07/29/22 1514   Dressing/Treatment Betadine swabs/povidone iodine;Foam 07/29/22 1514   Wound Length (cm) 1.9 cm 07/29/22 1514   Wound Width (cm) 0.7 cm 07/29/22 1514   Wound Depth (cm) 0.1 cm 07/29/22 1514   Wound Surface Area (cm^2) 1.33 cm^2 07/29/22 1514   Change in Wound Size % (l*w) 68.18 07/29/22 1514   Wound Volume (cm^3) 0.133 cm^3 07/29/22 1514   Wound Assessment Pink/red;Purple/maroon 07/29/22 1514   Drainage Amount None (dry) 07/29/22 1514   Drainage Description Serosanguinous 07/22/22 1409   Odor None 07/29/22 1514   Peri-wound Assessment Blanchable erythema 07/29/22 1514   Margins Flat/open edges 07/29/22 1514   Wound Thickness Description not for Pressure Injury Full thickness 07/29/22 1514   Number of days: 7       Wound 07/22/22 Foot Left;Lateral;Distal (Active)   Wound Image   07/29/22 1514   Wound Etiology Deep tissue/Injury 07/29/22 1514   Dressing Status New dressing applied 07/29/22 1514   Wound Cleansed Wound cleanser 07/29/22 1514   Dressing/Treatment Betadine swabs/povidone iodine;Foam 07/29/22 1514   Wound Length (cm) 2.5 cm 07/29/22 1514   Wound Width (cm) 1.7 cm 07/29/22 1514    Wound Surface Area (cm^2) 4.25 cm^2 07/29/22 1514   Change in Wound Size % (l*w) -41.67 07/29/22 1514   Wound Assessment  Purple/maroon 07/29/22 1514   Drainage Amount None (dry) 07/29/22 1514   Odor None 07/29/22 1514   Peri-wound Assessment Blanchable erythema 07/29/22 1514   Margins Undefined edges 07/22/22 1409   Wound Thickness Description not for Pressure Injury Full thickness 07/29/22 1514   Number of days: 7        Procedure - Debridement:   The indication for debridement was reviewed with patient. Risks of procedure (bleeding, infection, pain) were discussed with patient and questions were answered.  Consent was obtained.     Wound cleansed with dermal cleanser    Excisional Debridement:  Wound location:  Left plantar foot  Anesthesia: 4% lidocaine cream  Tissue removed: slough/devitalized tissue  Instrument: curette  Level of debridement: subcutaneous   Amount of bleeding:  Scant  Hemostasis: Pressure  Post debridement measurement:  0.5 x 0.4 cm  Patient tolerated procedure well.    Orders Placed This Encounter   Procedures    DRESSING CHANGE MAJOR     Left plantar foot:  Clean with dermal cleanser. Gently pack Promogran to ulcer base, cover with exufiber ensuring contact with wound, then Mepilex Border.  Do 3 x week and PRN.  Left lateral foot:  Paint betadine to both areas, allow to dry. Do daily with pealing back of protective Mepilex Border and changing the dressing as needed.    Wear Post-op shoe with offloading peg insole and use crutches with all ambulation, weight bearing on the heel     Standing Status:   Standing     Number of Occurrences:   1        Assessment & Plan:    ICD-10-CM    1. 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 Deep tissue injury  T14.8XXA       3. Type 2 diabetes mellitus with hyperglycemia, without long-term current use of insulin (HCC)  E11.65       Ulcer to  submet 1 is without current evidence of infection.  No real improvement in size in last couple of weeks.  DTI to lateral foot remains intact and has not evolved into ulce.  Continue with current wound care and use of Post-op shoe with offloading peg insole and crutches.  He asks again about how to help the ulcer heal.   We again discussed at length pressure relieving vs offloading, the Post-op shoe with offloading peg insole vs TCC.  He continues to decline a TCC.  Also reviewed importance of glycemic control.  Patient is currently not checking his blood sugar but he unfortunately is out of medication as well.  He reports has been eating [employer] biggie with chocolate chip cookie.  Reviewed watching carb intake and to obtain medication as soon as possible.  Discussed he continues to be at risk for infection and amputation. Reviewed signs and symptoms of infection and reasons to seek urgent evaluation.  Follow up in approximately 2 weeks.  Patient advised to call with any questions or concerns. Patient agrees with plan. Total time spent today was 25 minutes.     This document was generated with the aid of voice recognition software.  Please be aware that there may be inadvertent transcription errors not identified and corrected by the author.     Valarie Cones, APRN  [facility]

## 2022-08-05 ENCOUNTER — Ambulatory Visit: Payer: MEDICARE | Primary: Internal Medicine

## 2022-08-12 ENCOUNTER — Ambulatory Visit: Admit: 2022-08-12 | Payer: MEDICARE | Attending: Internal Medicine | Primary: Internal Medicine

## 2022-08-12 ENCOUNTER — Ambulatory Visit: Payer: MEDICARE | Primary: Internal Medicine

## 2022-08-12 ENCOUNTER — Inpatient Hospital Stay: Admit: 2022-08-12 | Payer: MEDICARE | Attending: Family | Primary: Internal Medicine

## 2022-08-12 DIAGNOSIS — Z4800 Encounter for change or removal of nonsurgical wound dressing: Secondary | ICD-10-CM

## 2022-08-12 DIAGNOSIS — Z0000 Encounter for general adult medical examination without abnormal findings: Secondary | ICD-10-CM

## 2022-08-12 DIAGNOSIS — I10 Essential (primary) hypertension: Secondary | ICD-10-CM

## 2022-08-12 DIAGNOSIS — L97426 Non-pressure chronic ulcer of left heel and midfoot 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: Secondary | ICD-10-CM

## 2022-08-12 LAB — LIPID PANEL
Chol/HDL Ratio: 4.8
Cholesterol, Total: 155 MG/DL (ref <200)
HDL: 32 MG/DL — ABNORMAL LOW (ref >60)
LDL Cholesterol: 62 MG/DL (ref <100)
Non-HDL Cholesterol: 123 mg/dL (ref <130)
Triglycerides: 305 MG/DL — ABNORMAL HIGH (ref 0–150)

## 2022-08-12 LAB — CBC WITH AUTO DIFFERENTIAL
Basophils %: 1 %
Basophils Absolute: 0.1 10*3/uL (ref 0.0–0.1)
Eosinophils %: 3 %
Eosinophils Absolute: 0.2 10*3/uL (ref 0.0–0.5)
Hematocrit: 44.4 % (ref 40.1–51.0)
Hemoglobin: 14.2 g/dL (ref 13.7–17.5)
Immature Granulocytes %: 1 %
Immature Granulocytes Absolute: 0.1 10*3/uL — ABNORMAL HIGH (ref 0.00–0.03)
Lymphocytes Absolute: 2.3 10*3/uL (ref 1.2–3.7)
Lymphocytes: 26 %
MCH: 29.9 PG (ref 25.6–32.2)
MCHC: 32 g/dL — ABNORMAL LOW (ref 32.2–35.5)
MCV: 93.5 FL (ref 80.0–95.0)
MPV: 9.1 FL — ABNORMAL LOW (ref 9.4–12.4)
Monocytes %: 10 %
Monocytes Absolute: 0.9 10*3/uL — ABNORMAL HIGH (ref 0.2–0.8)
Neutrophils Absolute: 5.1 10*3/uL (ref 1.6–6.1)
Nucleated RBCs: 0 PER 100 WBC (ref 0–0.02)
Platelets: 192 10*3/uL (ref 150–400)
RBC: 4.75 M/uL (ref 4.51–5.93)
RDW: 13.8 % (ref 11.6–14.4)
Seg Neutrophils: 59 %
WBC: 8.6 10*3/uL (ref 4.0–10.0)
nRBC: 0 10*3/uL

## 2022-08-12 LAB — URINALYSIS WITH REFLEX TO CULTURE
Bilirubin, Urine: NEGATIVE
Glucose, Ur: NORMAL mg/dL
Ketones, Urine: NEGATIVE mg/dL
Leukocyte Esterase, Urine: 500 uL — AB
Nitrite, Urine: NEGATIVE
Protein, Urine: 50 mg/dL — AB
Specific Gravity, UA: 1.017 (ref 1.008–1.030)
Urobilinogen, Urine: NORMAL EU/dL
WBC, UA: >100 /hpf — AB (ref 0–5)
pH, Urine: 5.5 (ref 5.0–8.0)

## 2022-08-12 LAB — COMPREHENSIVE METABOLIC PANEL
ALT: 22 U/L (ref 0–50)
AST: 32 U/L (ref 0–50)
Albumin/Globulin Ratio: 1.5 (ref 1.0–3.0)
Albumin: 4.5 g/dL (ref 3.5–5.2)
Alk Phosphatase: 116 U/L (ref 40–129)
Anion Gap: 14 mmol/L (ref 5–15)
BUN/Creatinine Ratio: 23 — ABNORMAL HIGH (ref 12–20)
BUN: 44 MG/DL — ABNORMAL HIGH (ref 8–23)
CO2: 23 mmol/L (ref 22–29)
Calcium: 10.1 MG/DL (ref 8.8–10.2)
Chloride: 106 mmol/L (ref 98–107)
Creatinine: 1.91 MG/DL — ABNORMAL HIGH (ref 0.67–1.17)
Est, Glom Filt Rate: 38 mL/min/{1.73_m2} — ABNORMAL LOW (ref >60)
Glucose: 257 mg/dL — ABNORMAL HIGH (ref 74–109)
Potassium: 5.1 mmol/L (ref 3.5–5.1)
Sodium: 143 mmol/L (ref 136–145)
Total Bilirubin: 0.25 mg/dL (ref 0–1.20)
Total Protein: 7.5 g/dL (ref 6.4–8.3)

## 2022-08-12 LAB — HEMOGLOBIN A1C
Estimated Avg Glucose: 177 mg/dL
Hemoglobin A1C: 7.8 % — ABNORMAL HIGH (ref 4.0–6.0)

## 2022-08-12 MED ORDER — GLIMEPIRIDE 2 MG PO TABS
2 | ORAL_TABLET | Freq: Two times a day (BID) | ORAL | 3 refills | Status: AC
Start: 2022-08-12 — End: ?

## 2022-08-12 MED ORDER — ATENOLOL 50 MG PO TABS
50 | ORAL_TABLET | Freq: Two times a day (BID) | ORAL | 3 refills | Status: AC
Start: 2022-08-12 — End: ?

## 2022-08-12 NOTE — Patient Instructions (Signed)
Learning About Being Active as an Older Adult  Why is being active important as you get older?     Being active is one of the best things you can do for your health. And it's never too late to start. Being active--or getting active, if you aren't already--has definite benefits. It can:  Give you more energy,  Keep your mind sharp.  Improve balance to reduce your risk of falls.  Help you manage chronic illness with fewer medicines.  No matter how old you are, how fit you are, or what health problems you have, there is a form of activity that will work for you. And the more physical activity you can do, the better your overall health will be.  What kinds of activity can help you stay healthy?  Being more active will make your daily activities easier. Physical activity includes planned exercise and things you do in daily life. There are four types of activity:  Aerobic.  Doing aerobic activity makes your heart and lungs strong.  Includes walking, dancing, and gardening.  Aim for at least 2 hours spread throughout the week.  It improves your energy and can help you sleep better.  Muscle-strengthening.  This type of activity can help maintain muscle and strengthen bones.  Includes climbing stairs, using resistance bands, and lifting or carrying heavy loads.  Aim for at least twice a week.  It can help protect the knees and other joints.  Stretching.  Stretching gives you better range of motion in joints and muscles.  Includes upper arm stretches, calf stretches, and gentle yoga.  Aim for at least twice a week, preferably after your muscles are warmed up from other activities.  It can help you function better in daily life.  Balancing.  This helps you stay coordinated and have good posture.  Includes heel-to-toe walking, tai chi, and certain types of yoga.  Aim for at least 3 days a week.  It can reduce your risk of falling.  Even if you have a hard time meeting the recommendations, it's better to be more active  than less active. All activity done in each category counts toward your weekly total. You'd be surprised how daily things like carrying groceries, keeping up with grandchildren, and taking the stairs can add up.  What keeps you from being active?  If you've had a hard time being more active, you're not alone. Maybe you remember being able to do more. Or maybe you've never thought of yourself as being active. It's frustrating when you can't do the things you want. Being more active can help. What's holding you back?  Getting started.  Have a goal, but break it into easy tasks. Small steps build into big accomplishments.  Staying motivated.  If you feel like skipping your activity, remember your goal. Maybe you want to move better and stay independent. Every activity gets you one step closer.  Not feeling your best.  Start with 5 minutes of an activity you enjoy. Prove to yourself you can do it. As you get comfortable, increase your time.  You may not be where you want to be. But you're in the process of getting there. Everyone starts somewhere.  How can you find safe ways to stay active?  Talk with your doctor about any physical challenges you're facing. Make a plan with your doctor if you have a health problem or aren't sure how to get started with activity.  If you're already active, ask your doctor if  there is anything you should change to stay safe as your body and health change.  If you tend to feel dizzy after you take medicine, avoid activity at that time. Try being active before you take your medicine. This will reduce your risk of falls.  If you plan to be active at home, make sure to clear your space before you get started. Remove things like TV cords, coffee tables, and throw rugs. It's safest to have plenty of space to move freely.  The key to getting more active is to take it slow and steady. Try to improve only a little bit at a time. Pick just one area to improve on at first. And if an activity hurts,  stop and talk to your doctor.  Where can you learn more?  Go to [employer] and enter P600 to learn more about "Learning About Being Active as an Older Adult."  Current as of: July 02, 2021  Content Version: 14.1   2006-2024 [employer].   Care instructions adapted under license by [facility]. If you have questions about a medical condition or this instruction, always ask your healthcare professional. [employer] disclaims any warranty or liability for your use of this information.           Learning About Dental Care for Older Adults  Dental care for older adults: Overview  Dental care for older people is much the same as for younger adults. But older adults do have concerns that younger adults do not. Older adults may have problems with gum disease and decay on the roots of their teeth. They may need missing teeth replaced or broken fillings fixed. Or they may have dentures that need to be cared for. Some older adults may have trouble holding a toothbrush.  You can help remind the person you are caring for to brush and floss their teeth or to clean their dentures. In some cases, you may need to do the brushing and other dental care tasks. People who have trouble using their hands or who have dementia may need this extra help.  How can you help with dental care?  Normal dental care  To keep the teeth and gums healthy:  Brush the teeth with fluoride toothpaste twice a day--in the morning and at night--and floss at least once a day. Plaque can quickly build up on the teeth of older adults.  Watch for the signs of gum disease. These signs include gums that bleed after brushing or after eating hard foods, such as apples.  See a dentist regularly. Many experts recommend checkups every 6 months.  Keep the dentist up to date on any new medications the person is taking.  Encourage a balanced diet that includes whole grains, vegetables, and fruits, and that is low in  saturated fat and sodium.  Encourage the person you're caring for not to use tobacco products. They can affect dental and general health.  Many older adults have a fixed income and feel that they can't afford dental care. But most towns and cities have programs in which dentists help older adults by lowering fees. Contact your area's public health offices or social services for information about dental care in your area.  Using a toothbrush  Older adults with arthritis sometimes have trouble brushing their teeth because they can't easily hold the toothbrush. Their hands and fingers may be stiff, painful, or weak. If this is the case, you can:  Offer an Mining engineer toothbrush.  Enlarge the handle of  a non-electric toothbrush by wrapping a sponge, an elastic bandage, or adhesive tape around it.  Push the toothbrush handle through a ball made of rubber or soft foam.  Make the handle longer and thicker by taping Popsicle sticks or tongue depressors to it.  You may also be able to buy special toothbrushes, toothpaste dispensers, and floss holders.  Your doctor may recommend a soft-bristle toothbrush if the person you care for bleeds easily. Bleeding can happen because of a health problem or from certain medicines.  A toothpaste for sensitive teeth may help if the person you care for has sensitive teeth.  How do you brush and floss someone's teeth?  If the person you are caring for has a hard time cleaning their teeth on their own, you may need to brush and floss their teeth for them. It may be easiest to have the person sit and face away from you, and to sit or stand behind them. That way you can steady their head against your arm as you reach around to floss and brush their teeth. Choose a place that has good lighting and is comfortable for both of you.  Before you begin, gather your supplies. You will need gloves, floss, a toothbrush, and a container to hold water if you are not near a sink. Wash and dry your hands well  and put on gloves. Start by flossing:  Gently work a piece of floss between each of the teeth toward the gums. A plastic flossing tool may make this easier, and they are available at most drugstores.  Curve the floss around each tooth into a U-shape and gently slide it under the gum line.  Move the floss firmly up and down several times to scrape off the plaque.  After you've finished flossing, throw away the used floss and begin brushing:  Wet the brush and apply toothpaste.  Place the brush at a 45-degree angle where the teeth meet the gums. Press firmly, and move the brush in small circles over the surface of the teeth.  Be careful not to brush too hard. Vigorous brushing can make the gums pull away from the teeth and can scratch the tooth enamel.  Brush all surfaces of the teeth, on the tongue side and on the cheek side. Pay special attention to the front teeth and all surfaces of the back teeth.  Brush chewing surfaces with short back-and-forth strokes.  After you've finished, help the person rinse the remaining toothpaste from their mouth.  Where can you learn more?  Go to [employer] and enter F944 to learn more about "Learning About Dental Care for Older Adults."  Current as of: September 02, 2021  Content Version: 14.1   2006-2024 [employer].   Care instructions adapted under license by [facility]. If you have questions about a medical condition or this instruction, always ask your healthcare professional. [employer] disclaims any warranty or liability for your use of this information.           Starting a Weight Loss Plan: Care Instructions  Overview     It can be a challenge to lose weight. But your doctor can help you make a weight-loss plan that meets your needs.  You don't have to make a lot of big changes at once. A better idea might be to focus on small changes and stick with them. When those changes become habit, you can add a few more  changes.  Some people find it helpful to take  an exercise or nutrition class. If you have questions, ask your doctor about seeing a registered dietitian or an exercise specialist. You might also think about joining a weight-loss support group.  If you're not ready to make changes right now, try to pick a date in the future. Then make an appointment with your doctor to talk about when and how you'll get started with a plan.  Follow-up care is a key part of your treatment and safety. Be sure to make and go to all appointments, and call your doctor if you are having problems. It's also a good idea to know your test results and keep a list of the medicines you take.  How can you care for yourself at home?  Set realistic goals. Many people expect to lose much more weight than is likely. A weight loss of 5% to 10% of your body weight may be enough to improve your health.  Get family and friends involved to provide support. Talk to them about why you are trying to lose weight, and ask them to help. They can help by participating in exercise and having meals with you, even if they may be eating something different.  Find what works best for you. If you do not have time or do not like to cook, a program that offers meal replacement bars or shakes may be better for you. Or if you like to prepare meals, finding a plan that includes daily menus and recipes may be best.  Ask your doctor about other health professionals who can help you achieve your weight loss goals.  A dietitian can help you make healthy changes in your diet.  An exercise specialist or personal trainer can help you develop a safe and effective exercise program.  A counselor or psychiatrist can help you cope with issues such as depression, anxiety, or family problems that can make it hard to focus on weight loss.  Consider joining a support group for people who are trying to lose weight. Your doctor can suggest groups in your area.  Where can you learn more?  Go  to [employer] and enter U357 to learn more about "Starting a Weight Loss Plan: Care Instructions."  Current as of: October 17, 2021  Content Version: 14.1   2006-2024 [employer], [employer].   Care instructions adapted under license by [facility]. If you have questions about a medical condition or this instruction, always ask your healthcare professional. [employer] disclaims any warranty or liability for your use of this information.           A Healthy Heart: Care Instructions  Overview     Coronary artery disease, also called heart disease, occurs when a substance called plaque builds up in the vessels that supply oxygen-rich blood to your heart muscle. This can narrow the blood vessels and reduce blood flow. A heart attack happens when blood flow is completely blocked. A high-fat diet, smoking, and other factors increase the risk of heart disease.  Your doctor has found that you have a chance of having heart disease. A heart-healthy lifestyle can help keep your heart healthy and prevent heart disease. This lifestyle includes eating healthy, being active, staying at a weight that's healthy for you, and not smoking or using tobacco. It also includes taking medicines as directed, managing other health conditions, and trying to get a healthy amount of sleep.  Follow-up care is a key part of your treatment and safety. Be sure to make and go to all  appointments, and call your doctor if you are having problems. It's also a good idea to know your test results and keep a list of the medicines you take.  How can you care for yourself at home?  Diet   Use less salt when you cook and eat. This helps lower your blood pressure. Taste food before salting. Add only a little salt when you think you need it. With time, your taste buds will adjust to less salt.    Eat fewer snack items, fast foods, canned soups, and other high-salt, high-fat, processed foods.    Read food labels  and try to avoid saturated and trans fats. They increase your risk of heart disease by raising cholesterol levels.    Limit the amount of solid fat--butter, margarine, and shortening--you eat. Use olive, peanut, or canola oil when you cook. Bake, broil, and steam foods instead of frying them.    Eat a variety of fruit and vegetables every day. Dark green, deep orange, red, or yellow fruits and vegetables are especially good for you. Examples include spinach, carrots, peaches, and berries.    Foods high in fiber can reduce your cholesterol and provide important vitamins and minerals. High-fiber foods include whole-grain cereals and breads, oatmeal, beans, brown rice, citrus fruits, and apples.    Eat lean proteins. Heart-healthy proteins include seafood, lean meats and poultry, eggs, beans, peas, nuts, seeds, and soy products.    Limit drinks and foods with added sugar. These include candy, desserts, and soda pop.   Heart-healthy lifestyle   If your doctor recommends it, get more exercise. For many people, walking is a good choice. Or you may want to swim, bike, or do other activities. Bit by bit, increase the time you're active every day. Try for at least 30 minutes on most days of the week.    Try to quit or cut back on using tobacco and other nicotine products. This includes smoking and vaping. If you need help quitting, talk to your doctor about stop-smoking programs and medicines. These can increase your chances of quitting for good. Quitting is one of the most important things you can do to protect your heart. It is never too late to quit. Try to avoid secondhand smoke too.    Stay at a weight that's healthy for you. Talk to your doctor if you need help losing weight.    Try to get 7 to 9 hours of sleep each night.    Limit alcohol to 2 drinks a day for men and 1 drink a day for women. Too much alcohol can cause health problems.    Manage other health problems such as diabetes, high blood pressure,  and high cholesterol. If you think you may have a problem with alcohol or drug use, talk to your doctor.   Medicines   Take your medicines exactly as prescribed. Call your doctor if you think you are having a problem with your medicine.    If your doctor recommends aspirin, take the amount directed each day. Make sure you take aspirin and not another kind of pain reliever, such as acetaminophen (Tylenol).   When should you call for help?   Call 911 if you have symptoms of a heart attack. These may include:   Chest pain or pressure, or a strange feeling in the chest.    Sweating.    Shortness of breath.    Pain, pressure, or a strange feeling in the back, neck, jaw, or upper belly  or in one or both shoulders or arms.    Lightheadedness or sudden weakness.    A fast or irregular heartbeat.   After you call 911, the operator may tell you to chew 1 adult-strength or 2 to 4 low-dose aspirin. Wait for an ambulance. Do not try to drive yourself.  Watch closely for changes in your health, and be sure to contact your doctor if you have any problems.  Where can you learn more?  Go to [employer] and enter F075 to learn more about "A Healthy Heart: Care Instructions."  Current as of: July 21, 2021  Content Version: 14.1   2006-2024 [employer], [employer].   Care instructions adapted under license by [facility]. If you have questions about a medical condition or this instruction, always ask your healthcare professional. [employer] disclaims any warranty or liability for your use of this information.      Personalized Preventive Plan for Dominic Stephens - 08/12/2022  Medicare offers a range of preventive health benefits. Some of the tests and screenings are paid in full while other may be subject to a deductible, co-insurance, and/or copay.    Some of these benefits include a comprehensive review of your medical history including lifestyle, illnesses that may run in your  family, and various assessments and screenings as appropriate.    After reviewing your medical record and screening and assessments performed today your provider may have ordered immunizations, labs, imaging, and/or referrals for you.  A list of these orders (if applicable) as well as your Preventive Care list are included within your After Visit Summary for your review.    Other Preventive Recommendations:    A preventive eye exam performed by an eye specialist is recommended every 1-2 years to screen for glaucoma; cataracts, macular degeneration, and other eye disorders.  A preventive dental visit is recommended every 6 months.  Try to get at least 150 minutes of exercise per week or 10,000 steps per day on a pedometer .  Order or download the FREE "Exercise & Physical Activity: Your Everyday Guide" from [employer]. Call [phone] or search [employer] online.  You need 1200-1500 mg of calcium and 1000-2000 IU of vitamin D per day. It is possible to meet your calcium requirement with diet alone, but a vitamin D supplement is usually necessary to meet this goal.  When exposed to the sun, use a sunscreen that protects against both UVA and UVB radiation with an SPF of 30 or greater. Reapply every 2 to 3 hours or after sweating, drying off with a towel, or swimming.  Always wear a seat belt when traveling in a car. Always wear a helmet when riding a bicycle or motorcycle.

## 2022-08-12 NOTE — Other (Signed)
Patient presents for follow up L foot wounds. Patient arrives amb with crutches; Procare shoe with offloading insole in place to L foot. Patient reports he is having assistance with wound dressing 3 x week utilizing Promorgan , Exufiber and Mepilex border to planar wound, Betadine and Mepilex border to lateral foot wounds.  Patient reports his CBG tis AM was 146-reports it usual runs 140-150. Patient reports he did pick up his glimepiride last week and has been adherent to taking it.  Wound cleansed with dermal cleanser.   L plantar foot wound presents with decreased depth,  no tunneling or undermining, no overt s/sx of infection, intact periwound. . L lateral foot distal DTI presents with small area of devitalized tissue, removed with cleansing. Open area measuring 1.0 x 1.0 with intact base, remaining wound non-blanching purple/maroon discoloration. L lateral foot proximal DTI presents CDI with non-blanching purple discoloration. Adject blister presents deflated with superficial opening - clean/pink. No s/s infection.    Dressing orders received: Cleanse with dermal cleanser. Saline moistened collagen applied to plantar wound bed, secondary dressing of Exufiber, cover with Mepilex border foam.   Betadine painted to DTIs, cover with Mepilex Borders. Patient  to change plantar dsgs 3x/week and prn compromise. Lateral Mepilex Border to be pulled back daily so allow for more Betadine to be applied. Dressing changes reviewed with  patient, verbalized understanding. Patient  instructed to wear Procare shoe with offloading peg insole at all times with WB and to continue ambulation with crutches. Patient encouraged to obtain a knee scooter to use when not at work. Patient to focus weight bearing on heel of foot to reduce pressure to  plantar wound and lateral wounds. Reinforce education to patient on proper nutrition for wound healing, CBG control, dressing changes and home dressing supplies, s/sx of infection and how/  when to contact clinic.  All questions answered. Patient in agreement with POC. Follow up in 1 week.      08/12/22 1517   Wound 07/22/22 Foot Left;Lateral;Proximal deflated bulla and DTI   Date First Assessed: 07/22/22   Primary Wound Type: Other (comment)  Location: Foot  Wound Location Orientation: Left;Lateral;Proximal  Wound Description (Comments): deflated bulla and DTI   Wound Image    Wound Etiology Other  (deflated blister and DTI)   Dressing Status New dressing applied   Wound Cleansed Wound cleanser   Dressing/Treatment Betadine swabs/povidone iodine;Foam   Wound Length (cm) 1.8 cm   Wound Width (cm) 0.7 cm   Wound Depth (cm) 0.1 cm   Wound Surface Area (cm^2) 1.26 cm^2   Change in Wound Size % (l*w) 69.86   Wound Volume (cm^3) 0.126 cm^3   Wound Healing % 5   Wound Assessment Pink/red;Purple/maroon   Drainage Amount None (dry)   Odor None   Peri-wound Assessment Blanchable erythema   Margins Flat/open edges   Wound Thickness Description not for Pressure Injury Full thickness   Wound 07/22/22 Foot Left;Lateral;Distal   Date First Assessed: 07/22/22   Primary Wound Type: Pressure Injury  Location: Foot  Wound Location Orientation: Left;Lateral;Distal   Wound Image    Wound Etiology Deep tissue/Injury   Dressing Status New dressing applied   Wound Cleansed Wound cleanser   Dressing/Treatment Betadine swabs/povidone iodine;Foam   Wound Length (cm) 2.5 cm   Wound Width (cm) 1.8 cm   Wound Surface Area (cm^2) 4.5 cm^2   Change in Wound Size % (l*w) -50   Wound Assessment Pink/red   Drainage Amount Scant (moist but unmeasurable)  Odor None   Peri-wound Assessment Blanchable erythema   Wound Thickness Description not for Pressure Injury Full thickness   Wound 07/01/22 Foot Left;Plantar   Date First Assessed: 07/01/22   Location: Foot  Wound Location Orientation: Left;Plantar   Wound Etiology Diabetic   Dressing Status New dressing applied   Wound Cleansed Wound cleanser   Dressing/Treatment  Collagen;Hydrofiber;Foam   Offloading for Diabetic Foot Ulcers Post op shoe;Other (comment)  (w/ offloading procare insole)   Wound Length (cm) 0.4 cm   Wound Width (cm) 0.4 cm   Wound Depth (cm) 0.1 cm   Wound Surface Area (cm^2) 0.16 cm^2   Change in Wound Size % (l*w) 88.57   Wound Volume (cm^3) 0.016 cm^3   Wound Healing % 98   Wound Assessment Granulation tissue   Drainage Amount Small (< 25%)   Drainage Description Serosanguinous   Odor None   Peri-wound Assessment Intact   Margins Defined edges   Wound Thickness Description not for Pressure Injury Full thickness

## 2022-08-12 NOTE — Progress Notes (Addendum)
Annual Exam Medicare Annual Wellness Visit    Dominic Stephens is here for Medicare AWV    Assessment & Plan   Annual physical exam  Welcome to Medicare preventive visit  -     AMB POC EKG ROUTINE W/ 12 LEADS, INTER & REP  Type 2 diabetes mellitus with hyperglycemia, without long-term current use of insulin (HCC)  -     glimepiride (AMARYL) 2 MG tablet; Take 1 tablet by mouth 2 times daily (with meals), Disp-180 tablet, R-3Normal  -     Comprehensive Metabolic Panel; Future  -     Hemoglobin A1C; Future  Primary hypertension  -     atenolol (TENORMIN) 50 MG tablet; Take 1 tablet by mouth 2 times daily, Disp-180 tablet, R-3Normal  -     CBC with Auto Differential; Future  Hyperlipidemia, unspecified hyperlipidemia type  -     Lipid Panel; Future  Urinary retention due to benign prostatic hyperplasia  Indwelling Foley catheter present  Urinary tract infection without hematuria, site unspecified  -     Urinalysis with Reflex to Culture; Future  This is a 67 y.o. year-old male  who comes today for annual physical as part of the annual physical we discussed diet,exercise ,weight loss, discussed adequate sleep, moderate alcohol.He does not smoke.Discussed safety use of seatbelts and helmets.Discussed sun exposure and use of sunblock reviewed immunizations and screening procedures  This visit was also Welcome to Medicare preventative visit conducted age friendly evaluation what matters medication mentation and mobility cognitive screening fall risk depression screen health risk assessment conducted and discussion of advanced directives  Diabetes mellitus last hemoglobin A1c 7.4 follow-up A1c has been ordered for today he will continue with glimepiride 2 mg twice a day  Hypertension blood pressure 118/62 will continue atenolol 50 mg twice a day  Hyperlipidemia dyslipidemia have ordered repeat lipids for today  Urinary retention due to BPH has indwelling Foley catheter  Patient feels as if he has UTI due to cloudiness and  sediment in urine will order urine culture  Patient will need Foley catheter changed  Regarding possible urinary tract infection urine will be sent for culture I did start him empirically on Keflex 500 4 times a day may need to adjust depending on results of culture  Patient tells me that he may be moving to [place] soon    Recommendations for Preventive Services Due: see orders and patient instructions/AVS.  Recommended screening schedule for the next 5-10 years is provided to the patient in written form: see Patient Instructions/AVS.     No follow-ups on file.     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  He has history of hypertension hyperlipidemia chronic kidney disease urinary retention due to BPH he has an indwelling Foley catheter history of kidney stones and bladder stone urology has told him that even if he had a procedure for his prostate that the bladder function may not return        Patient's complete Health Risk Assessment and screening values have been reviewed and are found in Flowsheets. The following problems were reviewed today and where indicated follow up appointments were made and/or referrals ordered.    Positive Risk Factor Screenings with Interventions:                Inactivity:  On average, how many days per week do you engage in moderate to strenuous exercise (like a brisk walk)?:  0 days (!) Abnormal  On average, how many minutes do you engage in exercise at this level?: 0 min  Interventions - Inactivity:  Discussed regular activities    Abnormal BMI (obese):  Body mass index is 30.97 kg/m. (!) Abnormal  Interventions:  Discussed diet and exercise         Dentist Screen:  Have you seen the dentist within the past year?: (!) No    Intervention:  Needs to see dentist      Vision Screen:  Visual Acuity screen is abnormal due to a score of 20/25 or worse.                  Objective   Vision Screening    Right eye Left eye  Both eyes   Without correction 20/25 20/25 20/20    With correction         Vitals:    08/12/22 1334   BP: 118/62   Site: Right Upper Arm   Position: Sitting   Cuff Size: Medium Adult   Pulse: 56   Resp: 18   Weight: 118.5 kg (261 lb 3.2 oz)   Height: 1.956 m (6' 5.01")      Body mass index is 30.97 kg/m.        General Appearance: alert and oriented to person, place and time, well developed and well- nourished, in no acute distress  Skin: warm and dry, no rash or erythema  Head: normocephalic and atraumatic  Eyes: pupils equal, round, and reactive to light, extraocular eye movements intact, conjunctivae normal  ENT: tympanic membrane, external ear and ear canal normal bilaterally, nose without deformity, nasal mucosa and turbinates normal without polyps  Neck: supple and non-tender without mass, no thyromegaly or thyroid nodules, no cervical lymphadenopathy  Pulmonary/Chest: clear to auscultation bilaterally- no wheezes, rales or rhonchi, normal air movement, no respiratory distress  Cardiovascular: normal rate, regular rhythm, normal S1 and S2, no murmurs, rubs, clicks, or gallops, distal pulses intact, no carotid bruits  Abdomen: soft, non-tender, non-distended, normal bowel sounds, no masses or organomegaly  Extremities:  Left foot ulcer improving  Musculoskeletal: normal range of motion, no joint swelling, deformity or tenderness  Neurologic: reflexes normal and symmetric, no cranial nerve deficit, gait, coordination and speech normal            Allergies   Allergen Reactions    Methocarbamol Other (See Comments)    Sulfa Antibiotics Myalgia    Sulfamethoxazole-Trimethoprim Myalgia     Prior to Visit Medications    Medication Sig Taking? Authorizing Provider   cephALEXin (KEFLEX) 500 MG capsule Take 1 capsule by mouth 4 times daily for 7 days Yes Billy Rocco, Demetrios Isaacs, MD   glimepiride (AMARYL) 2 MG tablet Take 1 tablet by mouth 2 times daily (with meals) Yes Gentry Pilson, Demetrios Isaacs, MD   atenolol (TENORMIN) 50 MG tablet  Take 1 tablet by mouth 2 times daily Yes Perrin Gens, Demetrios Isaacs, MD   blood glucose test strips (ONETOUCH ULTRA) strip Test sugars once daily E11.65 Yes Kennita Pavlovich, Demetrios Isaacs, MD   Blood Glucose Monitoring Suppl (ONE TOUCH ULTRA 2) w/Device KIT Test sugars once daily E11.65 Yes Fabian Walder, Demetrios Isaacs, MD   ONE TOUCH ULTRASOFT LANCETS MISC Test sugars once daily E11.65 Yes Ikea Demicco, Demetrios Isaacs, MD       CareTeam (Including outside providers/suppliers regularly involved in providing care):   Patient Care Team:  Henreitta Spittler, Demetrios Isaacs, MD as PCP - General (Internal Medicine)  Allison Quarry, RN as  Care Coordinator      Reviewed and updated this visit:  Tobacco  Allergies  Meds  Problems  Med Hx  Surg Hx  Soc Hx  Fam Hx

## 2022-08-13 MED ORDER — CEPHALEXIN 500 MG PO CAPS
500 MG | ORAL_CAPSULE | Freq: Four times a day (QID) | ORAL | 0 refills | Status: DC
Start: 2022-08-13 — End: 2022-08-15

## 2022-08-14 ENCOUNTER — Telehealth

## 2022-08-14 NOTE — Telephone Encounter (Signed)
-----   Message from Juanita Laster, MD sent at 08/13/2022 10:16 PM EDT -----  A1c 7.8  Urine has gram negative rods   Elevated trigs low HDL

## 2022-08-14 NOTE — Telephone Encounter (Signed)
Urine Cx is still pending   Pt was started on Keflex per OV notes     Will f/up with pt once all results are in

## 2022-08-15 LAB — CULTURE, URINE: Culture: >100000 — AB

## 2022-08-15 MED ORDER — CIPROFLOXACIN HCL 500 MG PO TABS
500 MG | ORAL_TABLET | Freq: Two times a day (BID) | ORAL | 0 refills | Status: AC
Start: 2022-08-15 — End: 2022-08-22

## 2022-08-15 NOTE — Telephone Encounter (Signed)
Chart Reviewed...     Used "Window's Snipping Tool" to copy & paste the below...     Urine Culture Results:       Prescribed cephalexin 500 mg QID x 7 by PCP back on July 16th.    Forwarding to covering Provider to review/advise regarding alternative antibiotic due to PCP being away from the office until Monday.      Allergies   Allergen Reactions    Methocarbamol Other (See Comments)    Sulfa Antibiotics Myalgia    Sulfamethoxazole-Trimethoprim Myalgia

## 2022-08-15 NOTE — Telephone Encounter (Signed)
Called and spoke with patient.....    Informed/discussed the results of his finalized urine culture results.  Patient is aware that he will need to discontinue the cephalexin and pick up/start on the Cipro instead.  Patient verbalized his understanding and noted that he will pick up/start the Cipro today.    Patient also notes that Dr. Kateri Mc was going to touch base with one of the local Urologists about him potentially "stopping by" to have his foley catheter changed and is wondering if there is an update on this. Patient notes that his current foley catheter has been in place for approximately x 3 months. He is hoping to avoid having to to go to the ER to have this changed. Asked if he has a current Urologist that he sees/works with and he reported "yes" - however; he reports that their office is in [place] and it's "very hard" to get in to see them.      Noted that this Nurse would look into this for him and follow back up with him via phone.   Patient verbalized his understanding/agreement.    Chart Reviewed...     Used "Window's Snipping Tool" to copy & paste the below...     Dr. Joette Catching Notes dated 02.16.2024:         Do not see any follow-up or upcoming appointments with Urology - ? If he failed to schedule this.

## 2022-08-15 NOTE — Telephone Encounter (Signed)
Based on the culture results less change the antibiotic from Keflex to ciprofloxacin.  Prescription sent to pharmacy

## 2022-08-19 ENCOUNTER — Ambulatory Visit: Payer: MEDICARE | Primary: Internal Medicine

## 2022-08-19 NOTE — Other (Signed)
Patient presents for follow up L foot wounds. Patient arrives amb with crutches; Procare shoe with offloading insole in place to L foot. Patient reports he is having assistance with wound dressing 3 x week utilizing Promorgan , Exufiber and Mepilex border to plantar wound, Betadine and Mepilex border to lateral foot wounds.  Patient reports his CBG this AM was 189--most recent A1C done on  7/15 is 7.8. Patient reports he is trying to eat better to help lose weight and control his glucose. Eduction provided on healthy protein  and protein supplement and carb control. Patient reports he has been adherent with the off loading shoe and crutches with all ambulation.    Wounds cleansed with dermal cleanser. L plantar foot wound presents with small measurement,  no tunneling or undermining, no overt s/sx of infection, intact periwound. . L lateral foot wounds present with devitalized tissue. Per NP orders, selective sharp debridement performed by [facility] as documented below. Patient tolerated well. Proximal wound is closed, distal wound has remaining opening of 0.6 x 0.8 x 0.1cm. No tunneling or undermining, no overt s/sx of infection    Dressing orders received: Cleanse with dermal cleanser. Saline moistened collagen applied to plantar wound and lateral distal wound beds, secondary dressing of Exufiber, cover with Mepilex border foam. Proximal lateral foot wound covered with Mepilex border foam for protection of new epithilium.  Dressing changes reviewed with  patient, verbalized understanding. Patient  instructed to wear Procare shoe with offloading peg insole at all times with WB and to continue ambulation with crutches.  Patient to focus weight bearing on heel of foot to reduce pressure to  plantar wound and lateral wounds. Reinforce education to patient on proper nutrition for wound healing, CBG control, dressing changes and home dressing supplies, s/sx of infection and how/ when to contact clinic.  All questions  answered. Patient in agreement with POC. Follow up in 1 week.     Ulcer assessment: Due to presence of devitalized within the wound bed, ulcer requires debridement.    Procedure: Debridement:   The indication for selective debridement was reviewed with patient. Risks of procedure (bleeding, infection, pain) were discussed with patient and consent signed. Questions were answered    Selective debridement   Indication: to remove slough / devitalized tissue/ selectively  Consent in chart   Time out : Yes  Anesthesia: N/a- patient is insensate  Instrument: curette  Bleeding: <58ml   Hemostasis: N/a  Tolerated procedure well  Procedural Pain: 0  Post - procedural pain: 0    Pre Debridement measurements (cm): Left lateral foot proximal wound  Wound Length: 1.5  Wound Width : 0.7  Wound Depth : 0.1    Post debridement measurements: 0 x 0 x 0 cm  Surface area debrided: 0 sq. Cm    Pre Debridement measurements (cm): Left lateral foot distal wound  Wound Length: 2.5  Wound Width : 1.8  Wound Depth : 0.1    Post debridement measurements: 0.6 x 0.8 x 0.1 cm  Surface area debrided: 0.48 sq. cm    Karyn Romanoski- [facility] recognized need for sharp debridement today by [facility].  [facility] performed debridement per NP order.  Patient agreed to debridement today.  Time out taken pre debridement to identify patient and site.     08/19/22 1514   Wound 07/01/22 Foot Left;Plantar   Date First Assessed: 07/01/22   Location: Foot  Wound Location Orientation: Left;Plantar   Wound Etiology Diabetic   Dressing Status New dressing applied  Wound Cleansed Wound cleanser   Dressing/Treatment Collagen;Hydrofiber;Foam   Offloading for Diabetic Foot Ulcers Post op shoe;Other (comment)  (w/ peg assist insert)   Wound Length (cm) 0.3 cm   Wound Width (cm) 0.2 cm   Wound Depth (cm) 0.1 cm   Wound Surface Area (cm^2) 0.06 cm^2   Change in Wound Size % (l*w) 95.71   Wound Volume (cm^3) 0.006 cm^3   Wound Healing % 99   Wound Assessment Granulation tissue    Drainage Amount Scant (moist but unmeasurable)   Drainage Description Serosanguinous   Odor None   Peri-wound Assessment Intact   Margins Defined edges   Wound Thickness Description not for Pressure Injury Full thickness   Wound 07/22/22 Foot Left;Lateral;Proximal deflated bulla and DTI   Date First Assessed: 07/22/22   Primary Wound Type: Other (comment)  Location: Foot  Wound Location Orientation: Left;Lateral;Proximal  Wound Description (Comments): deflated bulla and DTI   Wound Image     Wound Etiology Other  (deflated bulla and DTI)   Dressing Status Other (Comment)   Wound Cleansed Wound cleanser   Dressing/Treatment Foam   Wound Length (cm) 1.5 cm   Wound Width (cm) 0.7 cm   Wound Depth (cm) 0.1 cm   Wound Surface Area (cm^2) 1.05 cm^2   Change in Wound Size % (l*w) 74.88   Wound Volume (cm^3) 0.105 cm^3   Wound Healing % 21   Post-Procedure Length (cm) 0 cm   Post-Procedure Width (cm) 0 cm   Post-Procedure Depth (cm) 0 cm   Post-Procedure Surface Area (cm^2) 0 cm^2   Post-Procedure Volume (cm^3) 0 cm^3   Wound Assessment Devitalized tissue   Drainage Amount None (dry)   Odor None   Peri-wound Assessment Blanchable erythema   Wound 07/22/22 Foot Left;Lateral;Distal   Date First Assessed: 07/22/22   Primary Wound Type: Pressure Injury  Location: Foot  Wound Location Orientation: Left;Lateral;Distal   Wound Image     Wound Etiology Deep tissue/Injury   Dressing Status New dressing applied   Wound Cleansed Wound cleanser   Dressing/Treatment Collagen;Hydrofiber;Foam   Wound Length (cm) 2.5 cm   Wound Width (cm) 1.8 cm   Wound Depth (cm) 0.1 cm   Wound Surface Area (cm^2) 4.5 cm^2   Change in Wound Size % (l*w) -50   Wound Volume (cm^3) 0.45 cm^3   Post-Procedure Length (cm) 2.5 cm   Post-Procedure Width (cm) 1.8 cm   Post-Procedure Depth (cm) 0.1 cm   Post-Procedure Surface Area (cm^2) 4.5 cm^2   Post-Procedure Volume (cm^3) 0.45 cm^3   Wound Assessment Pink/red;Devitalized tissue   Drainage Amount None (dry)    Odor None   Peri-wound Assessment Blanchable erythema   Margins Flat/open edges   Wound Thickness Description not for Pressure Injury Full thickness

## 2022-08-26 ENCOUNTER — Inpatient Hospital Stay: Admit: 2022-08-26 | Discharge: 2022-08-27 | Payer: MEDICARE | Primary: Internal Medicine

## 2022-08-26 DIAGNOSIS — Z4800 Encounter for change or removal of nonsurgical wound dressing: Secondary | ICD-10-CM

## 2022-08-26 DIAGNOSIS — E11621 Type 2 diabetes mellitus with foot ulcer: Secondary | ICD-10-CM

## 2022-08-26 DIAGNOSIS — L97426 Non-pressure chronic ulcer of left heel and midfoot 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: Secondary | ICD-10-CM

## 2022-08-26 NOTE — Progress Notes (Signed)
Wound Care Progress Note    08/26/2022    HPI    Dominic Stephens is a 67 y.o. 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  Patient is performing wound care using Promogran, Exufiber and a silicone border foam.  Patient is in a Post-op shoe with offloading peg insole and utilize in crutches as assistive device with weight bearing.  Is not checking his blood sugar regularly.  Most recent A1c increased to 7.8 on 08/12/2022.  Prior reading was 7.4.  Denied new symptoms.    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          Vitals:    08/26/22 1508   BP: 124/64   Pulse: 60   Temp: (!) 96.4 F (35.8 C)       Physical Exam  Vitals reviewed.   Constitutional:       General: He is not in acute distress.     Appearance: Normal appearance. He is obese. He is not ill-appearing.   HENT:      Head: Normocephalic and atraumatic.   Pulmonary:      Effort: Pulmonary effort is normal. No respiratory distress.   Musculoskeletal:      Left lower leg: No edema.      Left foot: Prominent metatarsal heads present.         Feet:    Feet:      Comments: The left foot is pink, warm and appears well perfused.  Full-thickness ulcer to left sub metatarsal 1 has a dark red non granular base with some devitalized tissue.  Margin is circumferentially detached with some hyperkeratosis.  The lateral 5th met head had ulcer is moist dark red and non granular with some devitalized tissue.  The lateral mid foot is intact.  Bone is not visible or palpable. There is no erythema, malodor, purulent drainage, lymphangitis, or calor.   Skin:     General: Skin is warm and dry.      Capillary Refill: Capillary refill takes less than 2 seconds.      Findings: No erythema.   Neurological:      Mental Status: He is alert and oriented to person, place, and time.   Psychiatric:         Mood and Affect: Mood normal.  Behavior: Behavior normal.          Wound 07/01/22 Foot Left;Plantar (Active)   Wound Image   07/29/22 1514   Wound Etiology Diabetic 08/26/22 1515   Dressing Status New dressing applied 08/26/22 1515   Wound Cleansed Wound cleanser 08/26/22 1515   Dressing/Treatment Collagen;Hydrofiber;Foam 08/26/22 1515   Offloading for Diabetic Foot Ulcers Other (comment);Post op shoe 08/26/22 1515   Wound Length (cm) 0.3 cm 08/26/22 1515   Wound Width (cm) 0.3 cm 08/26/22 1515   Wound Depth (cm) 0.3 cm 08/26/22 1515   Wound Surface Area (cm^2) 0.09 cm^2 08/26/22 1515   Change in Wound Size % (l*w) 93.57 08/26/22 1515   Wound Volume (cm^3) 0.027 cm^3 08/26/22 1515   Wound Healing % 97 08/26/22 1515   Post-Procedure Length (cm) 0.6 cm 08/26/22 1515   Post-Procedure Width (cm) 0.5 cm 08/26/22 1515   Post-Procedure Depth (cm) 0.5 cm 08/26/22 1515   Post-Procedure Surface Area (cm^2) 0.3 cm^2 08/26/22 1515   Post-Procedure Volume (cm^3) 0.15 cm^3 08/26/22 1515   Distance Tunneling (cm) 1.2 cm 07/15/22 1507   Tunneling Position ___ O'Clock 9 07/15/22 1507   Wound Assessment Devitalized tissue 08/26/22 1515   Drainage Amount Small (< 25%) 08/26/22 1515    Drainage Description Serosanguinous 08/26/22 1515   Odor None 08/26/22 1515   Peri-wound Assessment Intact 08/19/22 1514   Margins Unattached edges;Defined edges 08/26/22 1515   Wound Thickness Description not for Pressure Injury Full thickness 08/26/22 1515   Number of days: 56       Wound 07/22/22 Foot Left;Lateral;Proximal deflated bulla and DTI (Active)   Wound Image   08/26/22 1515   Wound Etiology Other 08/26/22 1515   Dressing Status New dressing applied 08/26/22 1515   Wound Cleansed Wound cleanser 08/26/22 1515   Dressing/Treatment Foam 08/26/22 1515   Offloading for Diabetic Foot Ulcers Other (comment);Post op shoe 08/26/22 1515   Wound Length (cm) 0 cm 08/26/22 1515   Wound Width (cm) 0 cm 08/26/22 1515   Wound Depth (cm) 0 cm 08/26/22 1515   Wound Surface Area (cm^2) 0 cm^2 08/26/22 1515   Change in Wound Size % (l*w) 100 08/26/22 1515   Wound Volume (cm^3) 0 cm^3 08/26/22 1515   Wound Healing % 100 08/26/22 1515   Post-Procedure Length (cm) 0 cm 08/26/22 1515   Post-Procedure Width (cm) 0 cm 08/26/22 1515   Post-Procedure Depth (cm) 0 cm 08/26/22 1515   Post-Procedure Surface Area (cm^2) 0 cm^2 08/26/22 1515   Post-Procedure Volume (cm^3) 0 cm^3 08/26/22 1515   Wound Assessment Epithelialization 08/26/22 1515   Drainage Amount None (dry) 08/26/22 1515   Drainage Description Serosanguinous 07/22/22 1409   Odor None 08/19/22 1514   Peri-wound Assessment Blanchable erythema 08/19/22 1514   Margins Flat/open edges 08/12/22 1517   Wound Thickness Description not for Pressure Injury Full thickness 08/12/22 1517   Number of days: 35       Wound 07/22/22 Foot Left;Lateral;Distal (Active)   Wound Image    08/26/22 1515   Wound Etiology Other 08/26/22 1515   Dressing Status New dressing applied 08/26/22 1515   Wound Cleansed Wound cleanser 08/26/22 1515   Dressing/Treatment Collagen;Hydrofiber;Foam 08/26/22 1515   Offloading for Diabetic Foot Ulcers Other (comment);Post op shoe 08/26/22 1515   Wound Length (cm)  0.8 cm 08/26/22 1515   Wound Width (cm) 0.6 cm 08/26/22 1515   Wound Depth (cm) 0.2 cm 08/26/22 1515   Wound Surface Area (cm^2) 0.48 cm^2  08/26/22 1515   Change in Wound Size % (l*w) 84 08/26/22 1515   Wound Volume (cm^3) 0.096 cm^3 08/26/22 1515   Wound Healing % 79 08/26/22 1515   Post-Procedure Length (cm) 0.8 cm 08/26/22 1515   Post-Procedure Width (cm) 0.6 cm 08/26/22 1515   Post-Procedure Depth (cm) 0.2 cm 08/26/22 1515   Post-Procedure Surface Area (cm^2) 0.48 cm^2 08/26/22 1515   Post-Procedure Volume (cm^3) 0.096 cm^3 08/26/22 1515   Wound Assessment Pink/red;Devitalized tissue 08/26/22 1515   Drainage Amount Scant (moist but unmeasurable) 08/26/22 1515   Drainage Description Serosanguinous 08/26/22 1515   Odor None 08/26/22 1515   Peri-wound Assessment Hyperkeratosis (callous) 08/26/22 1515   Margins Flat/open edges 08/26/22 1515   Wound Thickness Description not for Pressure Injury Full thickness 08/26/22 1515   Number of days: 35        Procedure - Debridement:   The indication for debridement was reviewed with patient. Risks of procedure (bleeding, infection, pain) were discussed with patient and questions were answered.  Consent was obtained.     Wound cleansed with dermal cleanser    Excisional Debridement:  Wound location:  Left foot  Anesthesia:  None neuropathic  Tissue removed: slough/devitalized tissue  Instrument: curette  Level of debridement: subcutaneous   Amount of bleeding:  Scant  Hemostasis: Pressure  Post debridement measurement:  0.6 x 0.5 cm  0.8 x 0.6 cm  Patient tolerated procedure well.    Orders Placed This Encounter   Procedures    DRESSING CHANGE MAJOR     Left foot:  Cleanse with dermal cleanser. Apply Promogran to plantar and lateral distal ulces with cover with exufiber.  Apply Mepilex bordered foams ensuring the lateral covers mid foot for protection. .     Standing Status:   Standing     Number of Occurrences:   1        Assessment & Plan:    ICD-10-CM    1. 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 Type 2 diabetes mellitus with hyperglycemia, without long-term current use of insulin (HCC)  E11.65       3. Prominent metatarsal head of left foot  M21.6X2       Ulcer to sub metatarsal 1 is without change with continued evidence of pressure despite using Post-op shoe with offloading peg insole and crutches.  There is some wear however his Post-op shoe with offloading peg insole continues to provide some pressure relieving.  I have again advised he attempt to be non-weight bearing.  I again reviewed impact of pressure a mechanical forces on wound healing.  The lateral mid foot is closed however fragile with small ulceration to the distal foot.  No current evidence of infection.  Advised to continue to minimize pressure and avoid laying foot laterally onto the area.  Patient again declines use of a total contact cast.  Again reviewed importance of strict glycemic control.  Most recent A1c did increase to 7.8 this month.  Discussed continues to be at risk for infection amputation.  Advised to follow up in approximately 2 weeks.  Advised to call sooner with questions or concerns.  Patient is agreeable with the plan.        This document was generated with the aid of voice recognition software.  Please be aware that there may be inadvertent transcription  errors not identified and corrected by the author.     Valarie Cones, APRN  [facility]

## 2022-08-26 NOTE — Other (Signed)
Patient presents for follow up L foot wounds. Patient arrives amb with crutches; Procare shoe with offloading insole in place to L foot. Patient reports he is having assistance with wound dressing 3 x week utilizing Promorgan , Exufiber and Mepilex border to plantar wound and distal lateral foot wound, covering with Mepilex border foam.  Patients most  recent A1C done on  7/15 is 7.8.   Patient reports he has been adherent with the off loading shoe and crutches with all ambulation. Patient does report he did notice a few times over the week where he was standing with full weight on his foot. Reports he does walk to the bathroom at night barefoot.    Wounds cleansed with dermal cleanser. L plantar foot wound has increased depth and measurements. . Lateral proximal wound remains closed, lateral distal foot has increased measurements.  NP in for exam, assisted by [facility]. Sharp debridement of plantar and lateral distal wound by NP, assisted by CWOCN. Patient tolerated well.   Wound dressing:  Saline moistened collagen applied to plantar wound and lateral distal wound beds, secondary dressing of Exufiber, cover with Mepilex border foam. Proximal lateral foot wound covered with Mepilex border foam for protection of new epithilium.  Dressing changes reviewed with  patient, verbalized understanding. Patient  instructed to wear Procare shoe with offloading peg insole at all times with WB and to continue ambulation with crutches.  Patient to focus weight bearing on heel of foot to reduce pressure to  plantar wound and lateral wounds. Patient educated to not walk barefoot or in socks at home. Patient needs to wear footwear at all times. Reinforce education to patient on proper nutrition for wound healing, CBG control and the effect on wound healing, dressing changes and home dressing supplies, s/sx of infection and how/ when to contact clinic.  All questions answered. Patient in agreement with POC. Follow up in 2 weeks.  Supplies ordered from [facility]       08/26/22 1515   Wound 07/22/22 Foot Left;Lateral;Proximal deflated bulla and DTI   Date First Assessed: 07/22/22   Primary Wound Type: Other (comment)  Location: Foot  Wound Location Orientation: Left;Lateral;Proximal  Wound Description (Comments): deflated bulla and DTI   Wound Image    Wound Etiology Other  (deflated bulla and DTI)   Dressing Status New dressing applied   Wound Cleansed Wound cleanser   Dressing/Treatment Foam   Offloading for Diabetic Foot Ulcers Other (comment);Post op shoe  (w/ peg assist)   Wound Length (cm) 0 cm   Wound Width (cm) 0 cm   Wound Depth (cm) 0 cm   Wound Surface Area (cm^2) 0 cm^2   Change in Wound Size % (l*w) 100   Wound Volume (cm^3) 0 cm^3   Wound Healing % 100   Post-Procedure Length (cm) 0 cm   Post-Procedure Width (cm) 0 cm   Post-Procedure Depth (cm) 0 cm   Post-Procedure Surface Area (cm^2) 0 cm^2   Post-Procedure Volume (cm^3) 0 cm^3   Wound Assessment Epithelialization   Drainage Amount None (dry)   Odor None   Wound 07/22/22 Foot Left;Lateral;Distal   Date First Assessed: 07/22/22   Primary Wound Type: Pressure Injury  Location: Foot  Wound Location Orientation: Left;Lateral;Distal   Wound Image     Wound Etiology Other  (deep tissue injury)   Dressing Status New dressing applied   Wound Cleansed Wound cleanser   Dressing/Treatment Collagen;Hydrofiber;Foam   Offloading for Diabetic Foot Ulcers Other (comment);Post op shoe  (w/  peg insert)   Wound Length (cm) 0.8 cm   Wound Width (cm) 0.6 cm   Wound Depth (cm) 0.2 cm   Wound Surface Area (cm^2) 0.48 cm^2   Change in Wound Size % (l*w) 84   Wound Volume (cm^3) 0.096 cm^3   Wound Healing % 79   Post-Procedure Length (cm) 0.8 cm   Post-Procedure Width (cm) 0.6 cm   Post-Procedure Depth (cm) 0.2 cm   Post-Procedure Surface Area (cm^2) 0.48 cm^2   Post-Procedure Volume (cm^3) 0.096 cm^3   Wound Assessment Pink/red;Devitalized tissue   Drainage Amount Scant (moist but unmeasurable)    Drainage Description Serosanguinous   Odor None   Peri-wound Assessment Hyperkeratosis (callous)   Margins Flat/open edges   Wound Thickness Description not for Pressure Injury Full thickness   Wound 07/01/22 Foot Left;Plantar   Date First Assessed: 07/01/22   Location: Foot  Wound Location Orientation: Left;Plantar   Wound Etiology Diabetic   Dressing Status New dressing applied   Wound Cleansed Wound cleanser   Dressing/Treatment Collagen;Hydrofiber;Foam   Offloading for Diabetic Foot Ulcers Other (comment);Post op shoe  (w/ peg insert)   Wound Length (cm) 0.3 cm   Wound Width (cm) 0.3 cm   Wound Depth (cm) 0.3 cm   Wound Surface Area (cm^2) 0.09 cm^2   Change in Wound Size % (l*w) 93.57   Wound Volume (cm^3) 0.027 cm^3   Wound Healing % 97   Post-Procedure Length (cm) 0.6 cm   Post-Procedure Width (cm) 0.5 cm   Post-Procedure Depth (cm) 0.5 cm   Post-Procedure Surface Area (cm^2) 0.3 cm^2   Post-Procedure Volume (cm^3) 0.15 cm^3   Wound Assessment Devitalized tissue   Drainage Amount Small (< 25%)   Drainage Description Serosanguinous   Odor None   Peri-wound Assessment Hyperkeratosis (callous)   Margins Unattached edges;Defined edges   Wound Thickness Description not for Pressure Injury Full thickness

## 2022-09-02 ENCOUNTER — Ambulatory Visit: Payer: MEDICARE | Primary: Internal Medicine

## 2022-09-09 ENCOUNTER — Inpatient Hospital Stay: Admit: 2022-09-09 | Discharge: 2022-09-09 | Payer: MEDICARE | Primary: Internal Medicine

## 2022-09-09 DIAGNOSIS — E11621 Type 2 diabetes mellitus with foot ulcer: Secondary | ICD-10-CM

## 2022-09-09 NOTE — Other (Addendum)
Patient presents for follow up L foot wounds. Patient arrives amb with crutches; Procare shoe with offloading insole in place to L foot. Patient reports he is having assistance with wound dressing 3 x week utilizing Promorgan , Exufiber and Mepilex border to plantar wound and distal lateral foot wound, covering with Mepilex border foam.  Patient reports fasting CBG this morning was 197 fasting. Patient reports he has been working extra hours and is back to eating [employer] for dinner. Reinforce proper nutrition for wound healing and CBG control.  Patient reports he has been adherent with the off loading shoe and crutches with all ambulation.   Wounds cleansed with dermal cleanser. Lateral proximal wound remains closed, lateral distal  and plantar wounds present with slightly small measurement, devitalized tissue. Per NP order,   CWOCN to perform selective sharp debridement of devitalized tissue.  Selective sharp debridement performed of plantar and lateral distal wound performed  by [facility] as documented below . Patient tolerated well.   Wound dressing orders received:  Saline moistened collagen applied to plantar wound and lateral distal wound beds, secondary dressing of Exufiber, cover with Mepilex border foam. Proximal lateral foot wound covered with Mepilex border foam for protection of new epithilium.  Blue tubi fast applied to foot to secure dressings.  Dressing changes reviewed with  patient, verbalized understanding. Patient  instructed to wear Procare shoe with offloading peg insole at all times with WB and to continue ambulation with crutches.  Patient to focus weight bearing on heel of foot to reduce pressure to  plantar wound and lateral wounds. Patient educated to not walk barefoot or in socks at home. Patient needs to wear footwear at all times. Reinforce education to patient on proper nutrition for wound healing, CBG control and the effect on wound healing, dressing changes and home dressing  supplies, s/sx of infection and how/ when to contact clinic.  All questions answered. Patient in agreement with POC. Follow up in 2 weeks. Supplies ordered from [facility]    Ulcer assessment: Due to presence of slough within the wound bed, ulcer requires debridement.    Procedure: Debridement:   The indication for debridement was reviewed with patient. Risks of procedure (bleeding, infection, pain) were discussed with patient and consent signed. Questions were answered    Selective debridement   Indication: to remove slough / devitalized tissue/ selectively  Consent in chart   Time out : Yes  Anesthesia: N/a  Instrument: curette  Bleeding: <12ml   Hemostasis: N/a  Tolerated procedure well  Procedural Pain: 0  Post - procedural pain: 0    Left plantar wound:  Pre Debridement measurements (cm):   Wound Length: 0.3  Wound Width : 0.3  Wound Depth : 0.2  Post debridement measurements: 0.3 x 0.3 x 0.2 cm  Surface area debrided: 0.06 sq. Cm    Left lateral distal wound:   Pre Debridement measurements (cm):   Wound Length: 0.5  Wound Width : 0.5  Wound Depth : 0.1  Post debridement measurements: 0.5 x 0.5 x 0.1 cm  Surface area debrided: 0.25 sq. cm    NP recognized need for  selective sharp debridement today by RN.  RN performed  selective debridement per NP order.  Patient agreed to debridement today.  Time out taken pre debridement to identify patient and site.       09/09/22 1506   [REMOVED] Wound 07/22/22 Foot Left;Lateral;Proximal deflated bulla and DTI   Final Assessment Date: 09/09/22  Date First  Assessed: 07/22/22   Primary Wound Type: Other (comment)  Location: Foot  Wound Location Orientation: Left;Lateral;Proximal  Wound Description (Comments): deflated bulla and DTI  Wound Outcome: Healed   Wound Image    Wound Etiology Other  (deflated Bulla and DTI)   Dressing Status New dressing applied   Wound Cleansed Wound cleanser   Dressing/Treatment Foam   Wound Length (cm) 0 cm   Wound Width (cm) 0 cm   Wound Depth (cm) 0  cm   Wound Surface Area (cm^2) 0 cm^2   Change in Wound Size % (l*w) 100   Wound Volume (cm^3) 0 cm^3   Wound Healing % 100   Wound Assessment Epithelialization   Drainage Amount None (dry)   Odor None   Wound 07/22/22 Foot Left;Lateral;Distal   Date First Assessed: 07/22/22   Primary Wound Type: Pressure Injury  Location: Foot  Wound Location Orientation: Left;Lateral;Distal   Wound Image     Wound Etiology Deep tissue/Injury   Dressing Status New dressing applied   Wound Cleansed Wound cleanser   Dressing/Treatment Collagen;Hydrofiber;Foam   Offloading for Diabetic Foot Ulcers Post op shoe;Other (comment)  (w/ peg insert)   Wound Length (cm) 0.5 cm   Wound Width (cm) 0.5 cm   Wound Depth (cm) 0.1 cm   Wound Surface Area (cm^2) 0.25 cm^2   Change in Wound Size % (l*w) 91.67   Wound Volume (cm^3) 0.025 cm^3   Wound Healing % 94   Post-Procedure Length (cm) 0.5 cm   Post-Procedure Width (cm) 0.5 cm   Post-Procedure Depth (cm) 0.1 cm   Post-Procedure Surface Area (cm^2) 0.25 cm^2   Post-Procedure Volume (cm^3) 0.025 cm^3   Wound Assessment Pink/red;Devitalized tissue   Drainage Amount Scant (moist but unmeasurable)   Drainage Description Serosanguinous   Odor None   Peri-wound Assessment Blanchable erythema   Margins Flat/open edges   Wound Thickness Description not for Pressure Injury Full thickness   Wound 07/01/22 Foot Left;Plantar   Date First Assessed: 07/01/22   Location: Foot  Wound Location Orientation: Left;Plantar   Wound Image     Wound Etiology Diabetic   Dressing Status New dressing applied   Wound Cleansed Wound cleanser   Dressing/Treatment Collagen;Hydrofiber;Foam   Offloading for Diabetic Foot Ulcers Post op shoe;Other (comment)  (w/ peg assist)   Wound Length (cm) 0.3 cm   Wound Width (cm) 0.2 cm   Wound Depth (cm) 0.2 cm   Wound Surface Area (cm^2) 0.06 cm^2   Change in Wound Size % (l*w) 95.71   Wound Volume (cm^3) 0.012 cm^3   Wound Healing % 99   Post-Procedure Length (cm) 0.3 cm   Post-Procedure  Width (cm) 0.2 cm   Post-Procedure Depth (cm) 0.2 cm   Post-Procedure Surface Area (cm^2) 0.06 cm^2   Post-Procedure Volume (cm^3) 0.012 cm^3   Wound Assessment Pink/red;Devitalized tissue   Drainage Amount Small (< 25%)   Drainage Description Serosanguinous   Odor None   Peri-wound Assessment Dry/flaky;Hyperkeratosis (callous)   Margins Defined edges   Wound Thickness Description not for Pressure Injury Full thickness

## 2022-09-16 ENCOUNTER — Ambulatory Visit: Payer: MEDICARE | Primary: Internal Medicine

## 2022-09-19 NOTE — Care Coordination-Inpatient (Unsigned)
TC and VM message left from Mr. Rathgeb he reports his wound is healing fine, he does visit the wound care clinic every other week.  Patient reports his BS are under control.    Patient has an indwelling foley catheter for BPH he states it has been in 3 months now he would like to get it changed, discussed referral to urology with PCP at [facility] in [place] even if he can just get the catheter changed.     Patient reports he was established with a urologist however when patient opted to not have surgery but keep the foley catheter, that office seems to have lost interest.

## 2022-09-19 NOTE — Telephone Encounter (Signed)
Hi Dr. Kateri Mc     I spoke with Stephanie this afternoon, he is scheduled to have a catheter change on Tuesday 08/27 at 10:30 at [facility] office.  They can get a sample (UA-culture) the day they change the catheter  He states the catheter has not been changed in 3 months.    Patient feels he may have another UTI the urine in the foley bag is cloudy and he does report a foul odor.  Patient states he has been on Cipro before for this and it has worked well in the past.  Would you like to re-order this for him without having the urinalysis and culture prior?     Also can we fit him in at some point tomorrow 08/23 he has been experiencing some vertigo, and blocked ears for a few days now and would like you to check this for him.  I can ask Maralyn Sago to put him in for whatever time may work best for you.    Please advise thank you.

## 2022-09-20 ENCOUNTER — Inpatient Hospital Stay: Admit: 2022-09-20 | Payer: MEDICARE | Primary: Internal Medicine

## 2022-09-20 ENCOUNTER — Ambulatory Visit: Admit: 2022-09-20 | Discharge: 2022-09-20 | Payer: MEDICARE | Attending: Internal Medicine | Primary: Internal Medicine

## 2022-09-20 DIAGNOSIS — Z87440 Personal history of urinary (tract) infections: Secondary | ICD-10-CM

## 2022-09-20 LAB — AMB POC URINALYSIS DIP STICK MANUAL W/O MICRO
Bilirubin, Urine, POC: NEGATIVE
Glucose, Urine, POC: NEGATIVE
Ketones, Urine, POC: NEGATIVE
Nitrite, Urine, POC: POSITIVE
Protein, Urine, POC: 30
Specific Gravity, Urine, POC: 1.01 (ref 1.001–1.035)
Urobilinogen, POC: 0.2
pH, Urine, POC: 6 (ref 4.6–8.0)

## 2022-09-20 LAB — URINALYSIS WITH REFLEX TO CULTURE
Bilirubin, Urine: NEGATIVE
Glucose, Ur: NORMAL mg/dL
Ketones, Urine: NEGATIVE mg/dL
Leukocyte Esterase, Urine: 500 uL — AB
Protein, Urine: 30 mg/dL — AB
Specific Gravity, UA: 1.011 (ref 1.008–1.030)
Urobilinogen, Urine: NORMAL EU/dL
WBC, UA: >100 /HPF — AB (ref 0–5)
pH, Urine: 6 (ref 5.0–8.0)

## 2022-09-20 MED ORDER — CIPROFLOXACIN HCL 500 MG PO TABS
500 | ORAL_TABLET | Freq: Two times a day (BID) | ORAL | 0 refills | Status: AC
Start: 2022-09-20 — End: 2022-09-27

## 2022-09-20 NOTE — Progress Notes (Signed)
Dominic Stephens (DOB:  [birth year]) is a 67 y.o. male, Established patient, here for evaluation of the following chief complaint(s):  Ear Fullness (Pt experiencing vertigo about a week ago. Has happened 2-3 times. Subsided. Feeling nauseous w/ headache. Bi-lat ear fullness.)         Assessment & Plan      Results    1. History of recurrent UTIs  -     AMB POC URINALYSIS DIP STICK MANUAL W/O MICRO  -     Urinalysis with Reflex to Culture; Future  2. Indwelling Foley catheter present  3. Vertigo  4. Dysfunction of both eustachian tubes  5. 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 Diabetic foot infection (HCC)  Patient with history recurrent urinary tract infections appears to have another infection urine will be sent to laboratory he does have follow-up appointment with Urology for Foley catheter change prescription was sent for Cipro 500 twice a day  Vertigo episode was last week is doing better now  Dysfunction Eustachian tubes patient is told he could use Flonase nasal spray  Diabetic ulcer foot being followed by wound care    No follow-ups on file.       Subjective   History of Present Illness  This is a 67 year old male coming today for follow-up.  He has an indwelling Foley catheter due to BPH he is scheduled to have the catheter changed next week feels he has a urinary tract infection no fever but cloudy foul-smelling urine he also had episode of vertigo last week and has some ear fullness.  He is a diabetic he is followed for diabetic foot ulcer at wound clinic  His last A1c was 7.8           Objective   Blood pressure 118/68, pulse 68, temperature 97.6 F (36.4 C), temperature source Temporal, height 1.969 m (6' 5.5"), weight 124.1 kg (273 lb 8 oz), SpO2 96%.  Physical Exam  Well-developed well-nourished male no acute distress  Head eyes ears nose and throat are normal both ear canals were clean and drums were  intact  Neck is supple no JVD  Heart is S1-S2  Lungs are clear  Abdomen is soft bowel sounds positive         The patient (or guardian, if applicable) and other individuals in attendance with the patient were advised that Artificial Intelligence will be utilized during this visit to record, process the conversation to generate a clinical note and to support improvement of the AI technology. The patient (or guardian, if applicable) and other individuals in attendance at the appointment consented to the use of AI, including the recording.      An electronic signature was used to authenticate this note.    --Juanita Laster, MD

## 2022-09-20 NOTE — Telephone Encounter (Signed)
Spoke with Dr. Kateri Mc and his CMA Maralyn Sago ok to fit Mr. Baggarly in for a 12:15 ov today 08/23 with PCP.    TC to [place] to make him aware.

## 2022-09-22 LAB — CULTURE, URINE: Culture: >100000 — AB

## 2022-09-23 ENCOUNTER — Inpatient Hospital Stay: Admit: 2022-09-23 | Discharge: 2022-09-23 | Payer: MEDICARE | Primary: Internal Medicine

## 2022-09-23 DIAGNOSIS — E11621 Type 2 diabetes mellitus with foot ulcer: Secondary | ICD-10-CM

## 2022-09-23 NOTE — Progress Notes (Incomplete)
Wound Care Progress Note    09/23/2022    HPI    Dominic Stephens is a 67 y.o. 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     MRSA (methicillin resistant staph aureus) culture positive     Renal stones     Urinary bladder stone     Urinary catheter (Foley) change required     UTI (urinary tract infection)     UTI (urinary tract infection) due to urinary indwelling Foley catheter (HCC)     Wound cellulitis     diabetic        Allergies   Allergen Reactions    Methocarbamol Other (See Comments)    Sulfa Antibiotics Myalgia    Sulfamethoxazole-Trimethoprim Myalgia       Review of Systems       Vitals:    09/23/22 1506   BP: 130/74   Pulse: 70   Resp: 16   Temp: 97.4 F (36.3 C)       Physical Exam     Wound 07/01/22 Foot Left;Plantar (Active)   Wound Image   09/23/22 1506   Wound Etiology Diabetic 09/23/22 1506   Dressing Status New dressing applied 09/23/22 1506   Wound Cleansed Wound cleanser 09/23/22 1506   Dressing/Treatment Hydrofiber;Foam 09/23/22 1506   Offloading for Diabetic Foot Ulcers Post op shoe 09/23/22 1506   Dressing Change Due 09/25/22 09/23/22 1506   Wound Length (cm) 0.3 cm 09/23/22 1506   Wound Width (cm) 0.2 cm 09/23/22 1506   Wound Depth (cm) 0.4 cm 09/23/22 1506   Wound Surface Area (cm^2) 0.06 cm^2 09/23/22 1506   Change in Wound Size % (l*w) 95.71 09/23/22 1506   Wound Volume (cm^3) 0.024 cm^3 09/23/22 1506   Wound Healing % 98 09/23/22 1506   Post-Procedure Length (cm) 0.5 cm 09/23/22 1506   Post-Procedure Width (cm) 0.4 cm 09/23/22 1506   Post-Procedure Depth (cm) 0.3 cm 09/23/22 1506   Post-Procedure Surface Area (cm^2) 0.2 cm^2 09/23/22 1506   Post-Procedure Volume (cm^3) 0.06 cm^3 09/23/22 1506   Distance  Tunneling (cm) 1.2 cm 07/15/22 1507   Tunneling Position ___ O'Clock 9 07/15/22 1507   Undermining Maxium Distance (cm) 0.2 09/23/22 1506   Wound Assessment Pink/red;Devitalized tissue 09/23/22 1506   Drainage Amount Moderate (25-50%) 09/23/22 1506   Drainage Description Serosanguinous 09/23/22 1506   Odor None 09/23/22 1506   Peri-wound Assessment Hyperkeratosis (callous) 09/23/22 1506   Margins Unattached edges;Defined edges 09/23/22 1506   Wound Thickness Description not for Pressure Injury Full thickness 09/23/22 1506   Number of days: 84       Wound 07/22/22 Foot Left;Lateral;Distal (Active)   Wound Image   09/23/22 1506   Wound Etiology Deep tissue/Injury 09/23/22 1506   Dressing Status New dressing applied 09/23/22 1506   Wound Cleansed Wound cleanser 09/23/22 1506   Dressing/Treatment Collagen;Hydrofiber;Foam 09/23/22 1506   Offloading for Diabetic Foot Ulcers Post op shoe 09/23/22 1506   Dressing Change Due 09/25/22 09/23/22 1506   Wound Length (cm) 0.4 cm 09/23/22 1506   Wound Width (cm) 0.4 cm 09/23/22 1506  Wound Depth (cm) 0.2 cm 09/23/22 1506   Wound Surface Area (cm^2) 0.16 cm^2 09/23/22 1506   Change in Wound Size % (l*w) 94.67 09/23/22 1506   Wound Volume (cm^3) 0.032 cm^3 09/23/22 1506   Wound Healing % 93 09/23/22 1506   Post-Procedure Length (cm) 0.5 cm 09/23/22 1506   Post-Procedure Width (cm) 0.4 cm 09/23/22 1506   Post-Procedure Depth (cm) 0.2 cm 09/23/22 1506   Post-Procedure Surface Area (cm^2) 0.2 cm^2 09/23/22 1506   Post-Procedure Volume (cm^3) 0.04 cm^3 09/23/22 1506   Wound Assessment Pink/red;Devitalized tissue 09/23/22 1506   Drainage Amount Small (< 25%) 09/23/22 1506   Drainage Description Serosanguinous 09/23/22 1506   Odor None 09/23/22 1506   Peri-wound Assessment Maceration 09/23/22 1506   Margins Flat/open edges 09/23/22 1506   Wound Thickness Description not for Pressure Injury Full thickness 09/23/22 1506   Number of days: 63        Procedure - Debridement:   The indication  for debridement was reviewed with patient. Risks of procedure (bleeding, infection, pain) were discussed with patient and questions were answered.  Consent was obtained.     Wound cleansed with dermal cleanser    Excisional Debridement:  Wound location:  Anesthesia: 4% lidocaine cream  Tissue removed: slough/devitalized tissue  Instrument: curette  Level of debridement: subcutaneous   Amount of bleeding:  Hemostasis: Pressure  Post debridement measurement:  *  Patient tolerated procedure well.    Orders Placed This Encounter   Procedures    DRESSING CHANGE MAJOR     Left foot:  Clean with dermal cleanser. Apply Promogran to lateral ulcer.  Apply Exufiber to plantar and lateral foot ulcers, then cover with Mepilex bordered foam.  Wound care to be performed MWF and PRN.  Protective bandage/bandaid to closed lateral foot.     Standing Status:   Standing     Number of Occurrences:   1        Assessment & Plan:    ICD-10-CM    1. Diabetic ulcer of left midfoot associated with type 2 diabetes mellitus, with fat layer exposed (HCC)  E11.621     L97.422       2. Type 2 diabetes mellitus with hyperglycemia, without long-term current use of insulin (HCC)  E11.65       3. Prominent metatarsal head of left foot  M21.6X2         Total time spent today was       This document was generated with the aid of voice recognition software.  Please be aware that there may be inadvertent transcription errors not identified and corrected by the author.     Valarie Cones, APRN  [facility]
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
## 2022-10-01 ENCOUNTER — Ambulatory Visit: Payer: MEDICARE | Primary: Internal Medicine

## 2022-10-04 DIAGNOSIS — S161XXA Strain of muscle, fascia and tendon at neck level, initial encounter: Secondary | ICD-10-CM
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
## 2022-10-05 ENCOUNTER — Inpatient Hospital Stay
Admit: 2022-10-05 | Discharge: 2022-10-05 | Disposition: A | Discharge status: Home / Self Care | Payer: PRIVATE HEALTH INSURANCE | Attending: Emergency Medical Services

## 2022-10-05 ENCOUNTER — Emergency Department: Admit: 2022-10-05 | Payer: PRIVATE HEALTH INSURANCE | Primary: Internal Medicine

## 2022-10-05 LAB — POCT GLUCOSE: POC Glucose: 244 mg/dL

## 2022-10-05 MED ORDER — NAPROXEN 500 MG PO TABS
500 | ORAL_TABLET | Freq: Two times a day (BID) | ORAL | 0 refills | Status: DC | PRN
Start: 2022-10-05 — End: 2024-02-04

## 2022-10-05 MED ORDER — CYCLOBENZAPRINE HCL 10 MG PO TABS
10 | ORAL_TABLET | Freq: Three times a day (TID) | ORAL | 0 refills | Status: AC | PRN
Start: 2022-10-05 — End: 2022-10-15
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
## 2022-10-05 NOTE — ED Notes (Signed)
 D/C instructions reviewed with patient. All questions answered. Patient D/C'd home.
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
## 2022-10-15 ENCOUNTER — Inpatient Hospital Stay: Payer: MEDICARE | Primary: Internal Medicine

## 2022-10-28 ENCOUNTER — Ambulatory Visit: Payer: Medicare (Managed Care) | Primary: Internal Medicine

## 2022-12-16 NOTE — Telephone Encounter (Signed)
 Chart Reviewed...     Used "Window's Snipping Tool" to copy & paste the below...         Forwarding to [facility] Team to reach out to patient to assist him with scheduling a Follow-Up Visit with Dr. Kateri Mc as per his request..Dominic Stephens

## 2022-12-18 NOTE — Telephone Encounter (Signed)
 Tried calling pt but his vm is full and could not leave a message.    Please schedule an OV for DM f.u ~ per Dr.Reape's request.    Will send Mychart message to pt.

## 2022-12-19 NOTE — Telephone Encounter (Signed)
 Unable to lvm at mobile number.  Spoke with contact/Laurie who states patient has been travelling for work since 08/2022 and does not know when he will return but will pass on message that patient needs to schedule a follow up.   MyChart message sent 11/20/

## 2022-12-20 NOTE — Telephone Encounter (Signed)
 FYI to provider

## 2022-12-28 IMAGING — DX XR FOOT 3+ VIEWS LEFT
1 series · 3 of 3 positions shown · non-contrast
Comparison: None

PT foot has several wounds. From diabetes. PT states the wound by his 5th metatarsal bone hurts the most.
FINAL REPORT:
XR FOOT 3+ VIEWS LEFT
INDICATION: DM wounds pain

[Series 3686: AP · right · 0.10mm/px · 3 of 3 slices shown]
[im 1/3]
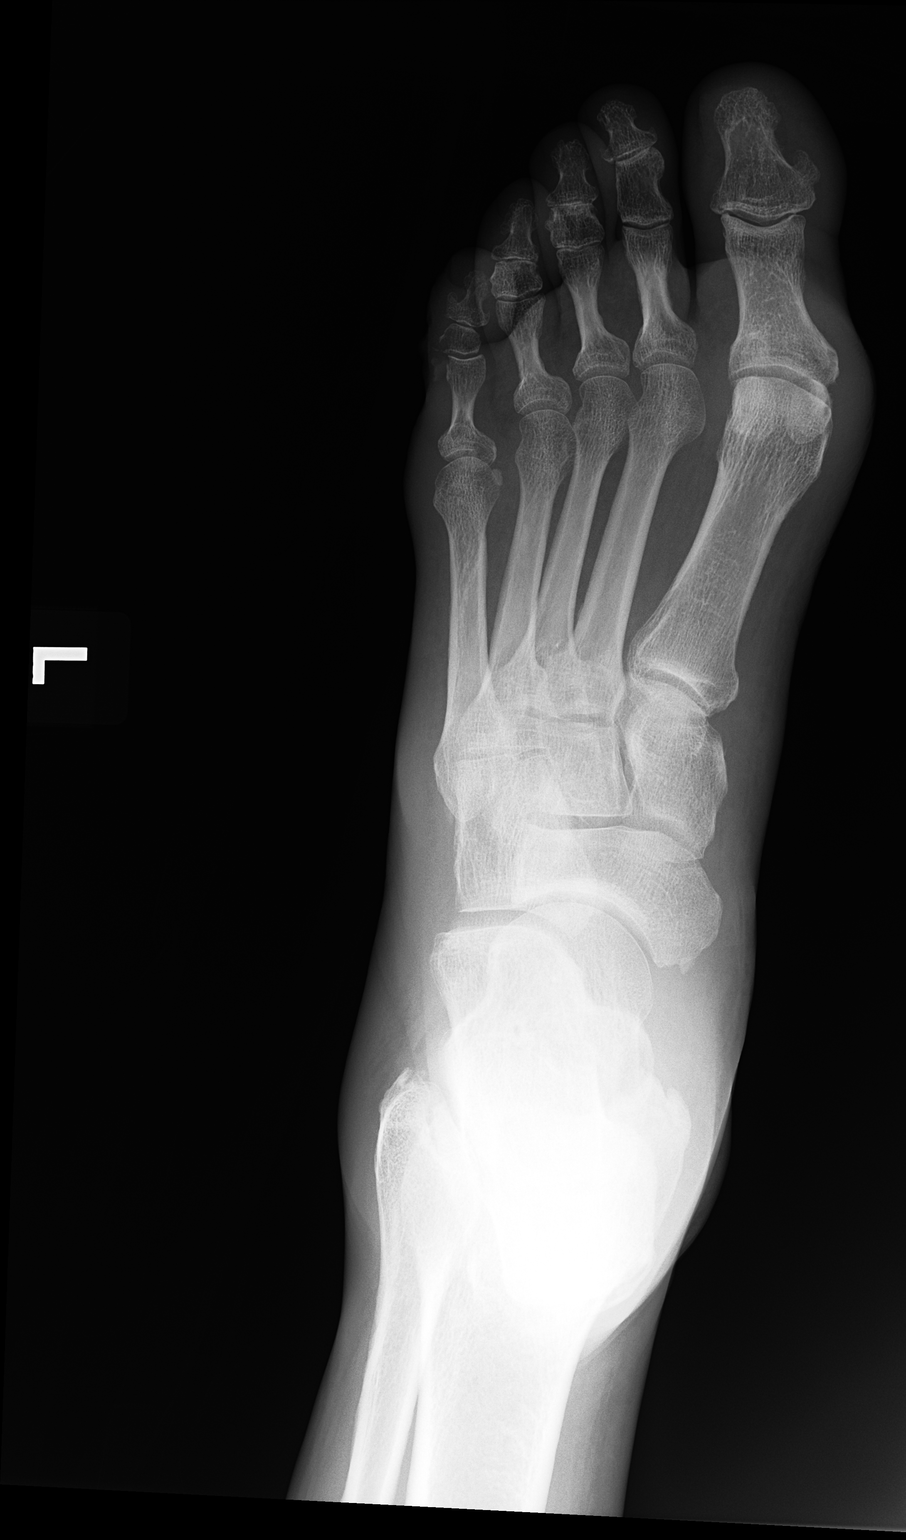
[im 2/3]
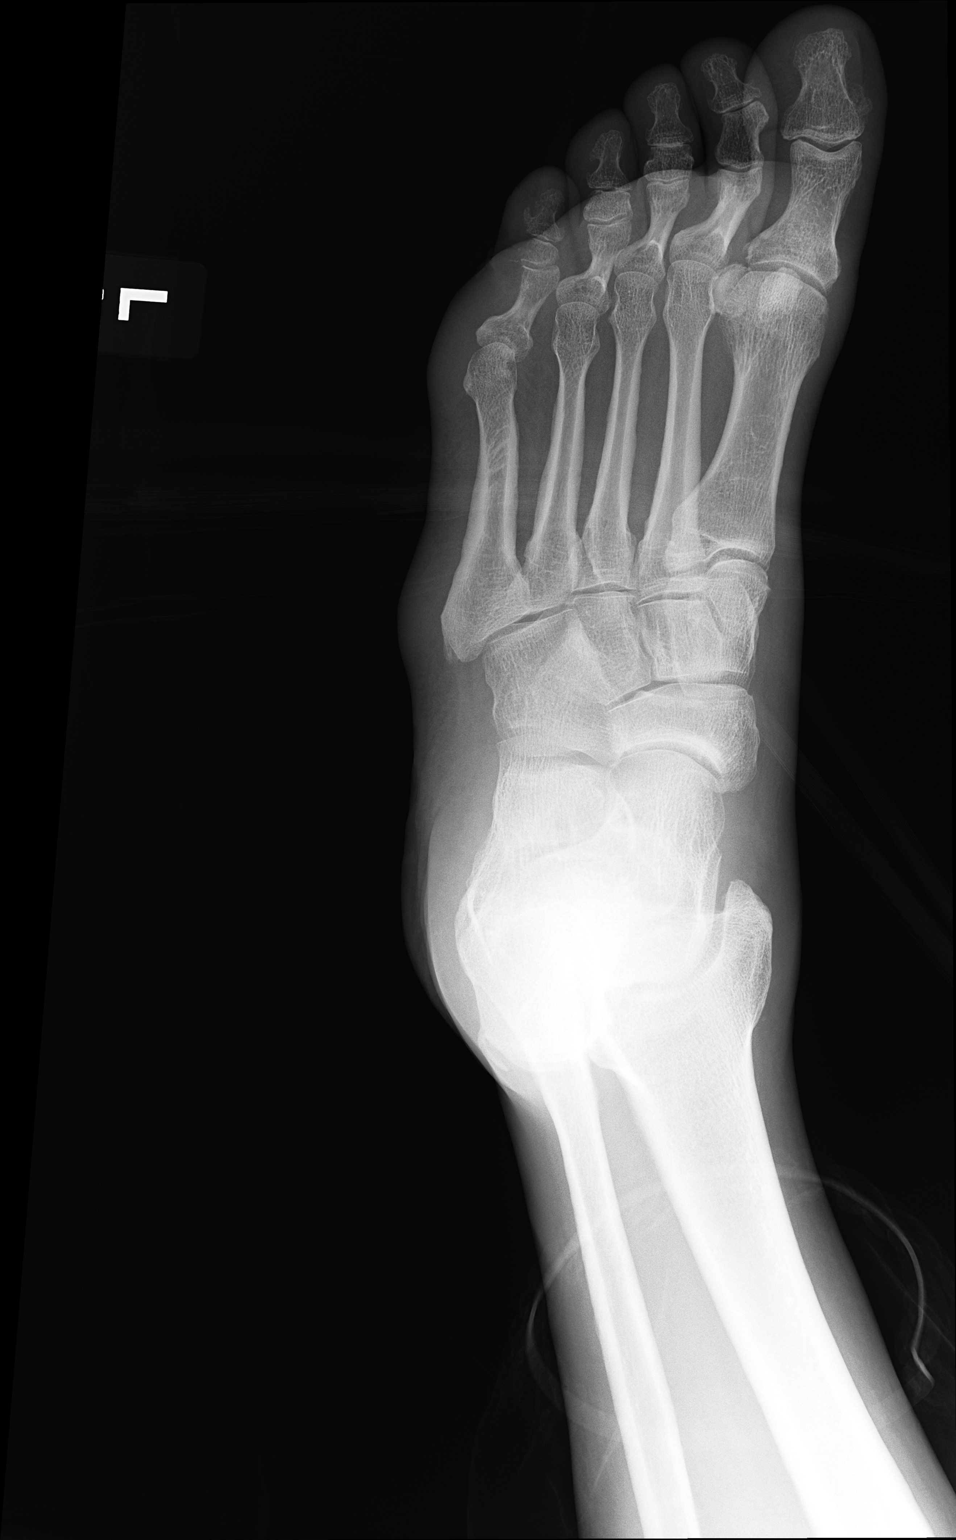
[im 3/3]
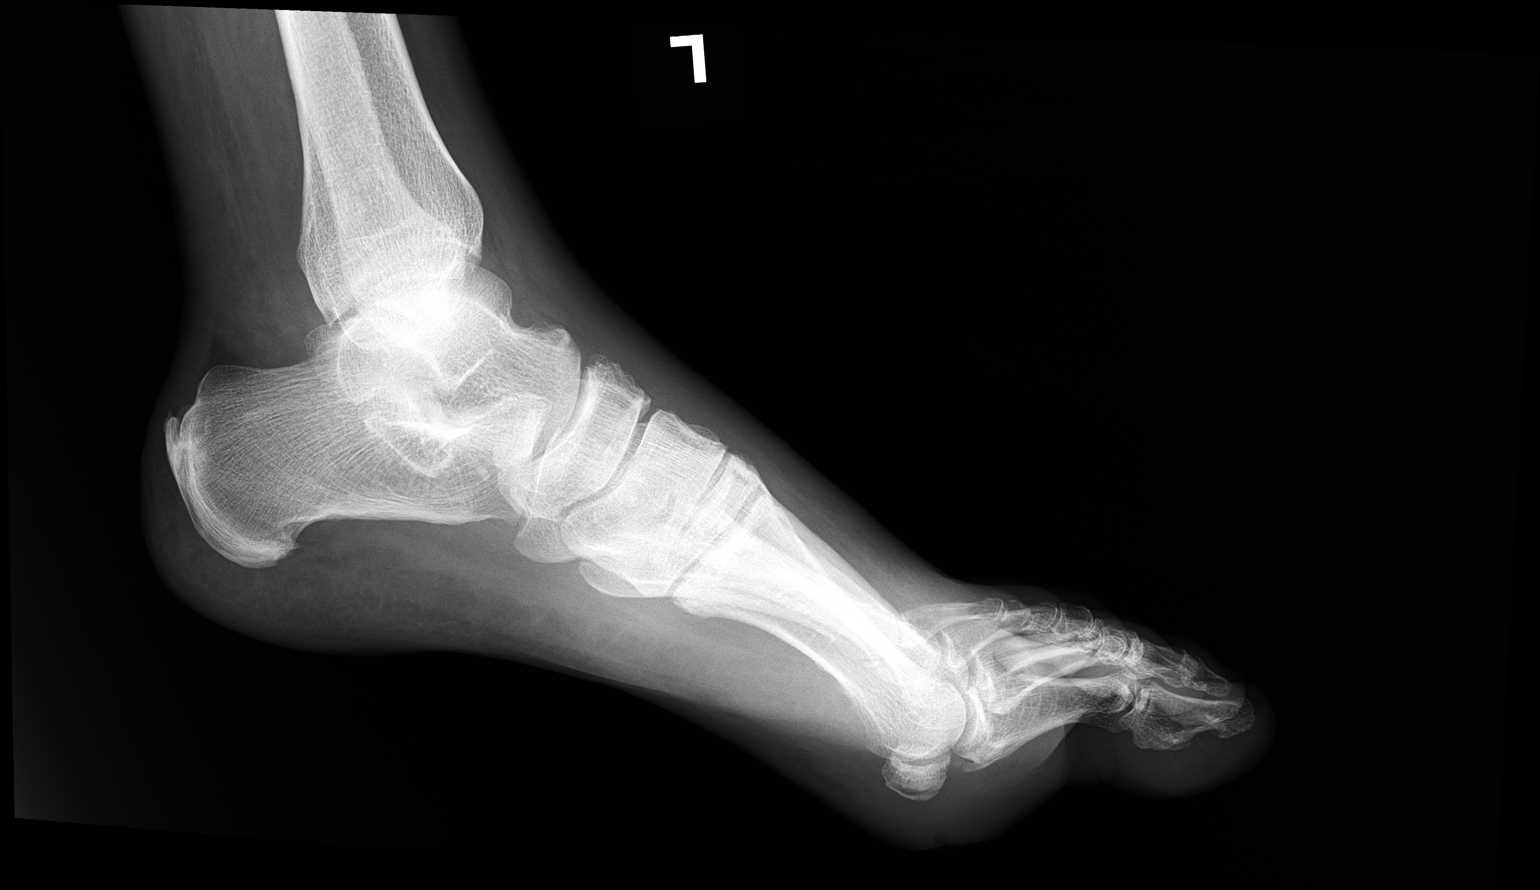

[3 of 3 positions shown; findings below may reference images not displayed]

FINDINGS: 3 views left foot. Soft tissue swelling seen throughout. There is subtle lucency and bony erosion involving the lateral aspects of the proximal and middle phalanges of the fifth phalanx. Other bones are unremarkable. Mild degenerative changes first MTP joint.
IMPRESSION: Subtle lucency and bony erosion involving the lateral aspects of the proximal and middle phalanges of the fifth phalanx. This can be seen with osteomyelitis. Consider MRI for better evaluation.

## 2022-12-29 IMAGING — MR MRI FOOT LEFT WITH AND WITHOUT IV CONTRAST
10 series · 40 of 40 positions shown · IV contrast (Contrast agent)
Comparison: Plain films 12/28/2022

FINAL REPORT:
MRI left forefoot with and without contrast.
INDICATION: Pain. Evaluate for osteomyelitis.
TECHNIQUE: Multiplanar multisequence MR imaging of the left forefoot with and without contrast

[Series 3001: survey · axial · left · 6.0mm · 0.59mm/px · 1 of 11 slices shown]
[im 1/11]
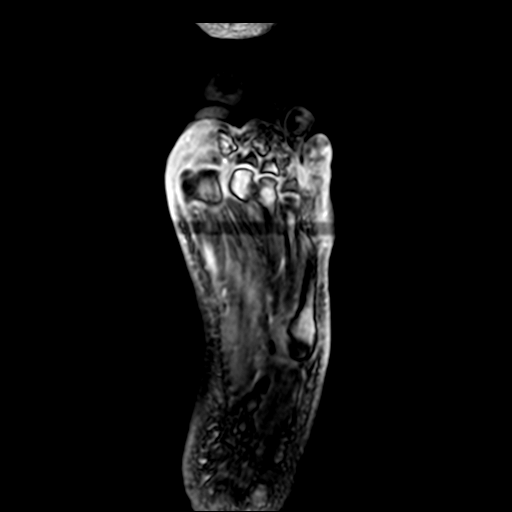

[Series 4001: T2 fat-sat · coronal · left · 4.0mm · 0.47mm/px · 5 of 32 slices shown (1 of 2)]
[im 1/32]
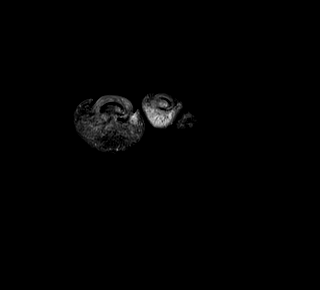
[im 8/32]
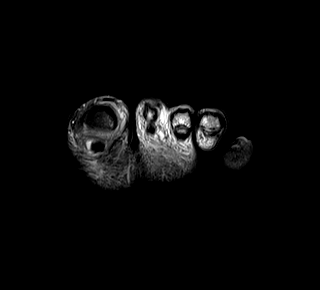
[im 16/32]
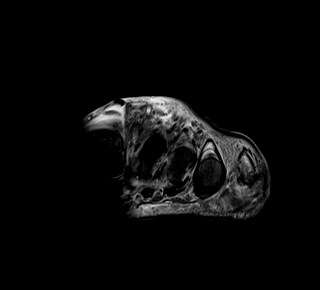
[im 24/32]
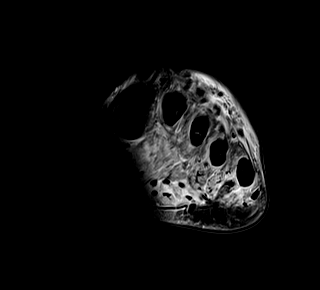
[im 32/32]
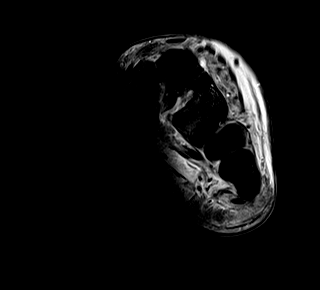

[Series 5001: T1 · coronal · left · 4.0mm · 0.47mm/px · 5 of 32 slices shown (1 of 3)]
[im 1/32]
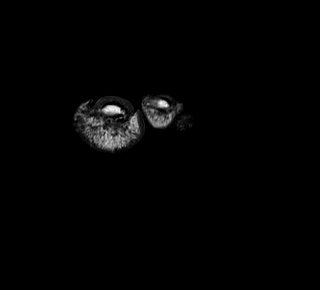
[im 8/32]
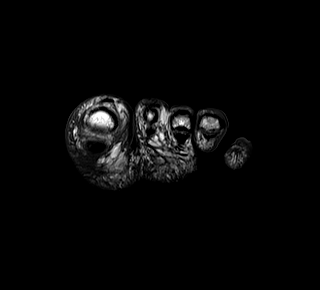
[im 16/32]
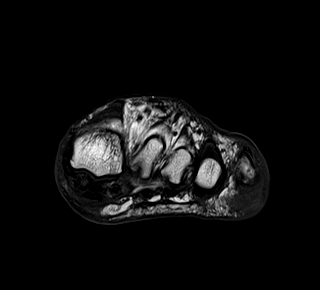
[im 24/32]
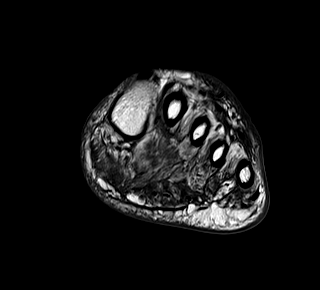
[im 32/32]
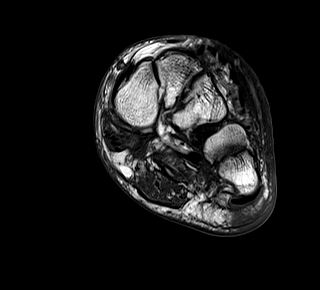

[Series 6001: T1 · axial · left · 4.0mm · 0.53mm/px · z∈[-91,+0]mm · 4 of 23 slices shown (2 of 3)]
[im 1/23]
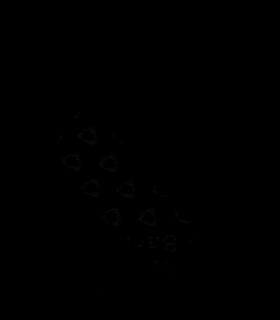
[im 8/23]
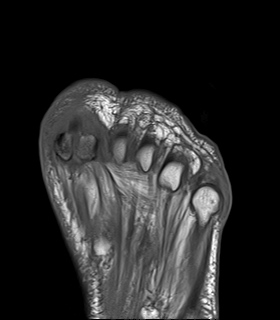
[im 15/23]
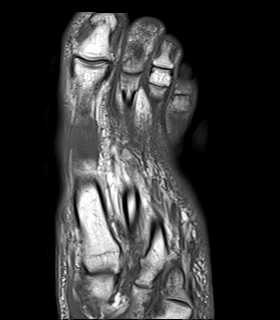
[im 23/23]
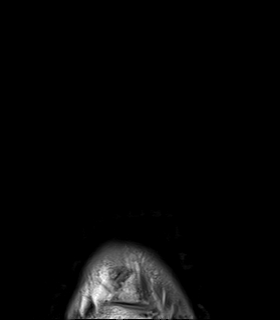

[Series 7001: T2 fat-sat · axial · left · 4.0mm · 0.53mm/px · z∈[-91,+0]mm · 4 of 23 slices shown (2 of 2)]
[im 1/23]
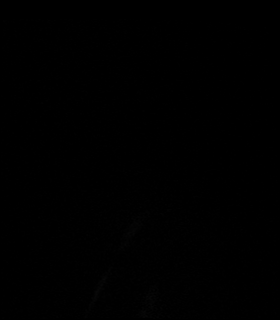
[im 8/23]
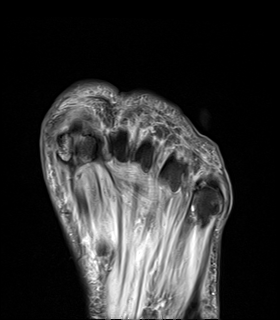
[im 15/23]
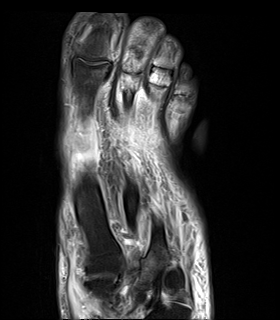
[im 23/23]
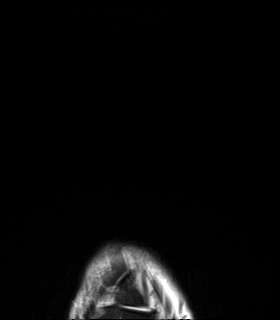

[Series 8001: STIR · sagittal · left · 4.0mm · 0.53mm/px · 4 of 22 slices shown]
[im 1/22]
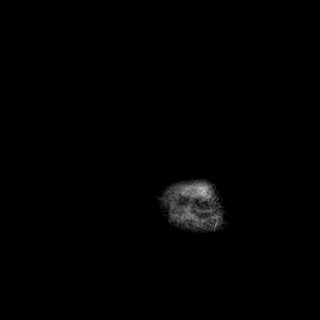
[im 8/22]
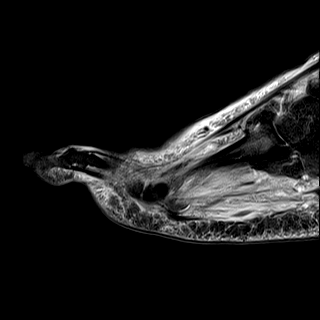
[im 15/22]
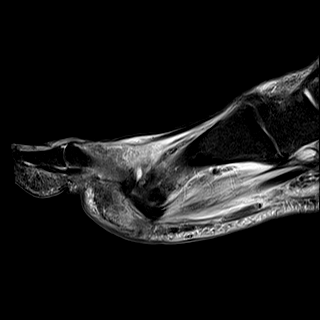
[im 22/22]
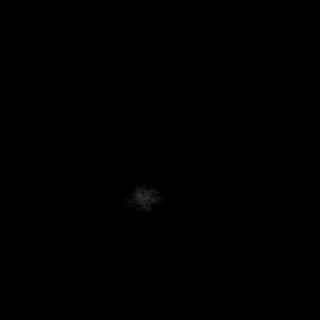

[Series 9001: T1 · sagittal · left · 4.0mm · 0.53mm/px · 4 of 22 slices shown (3 of 3)]
[im 1/22]
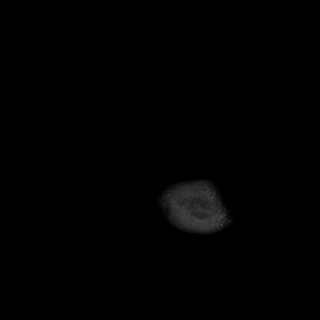
[im 8/22]
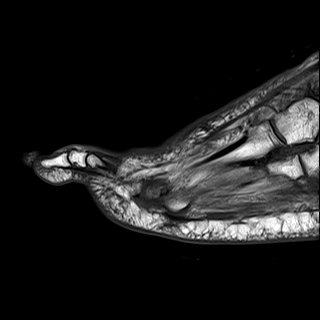
[im 15/22]
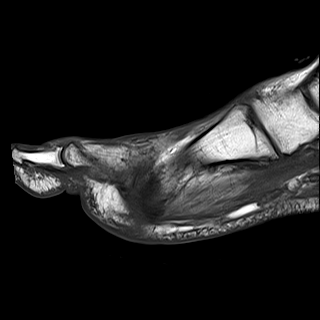
[im 22/22]
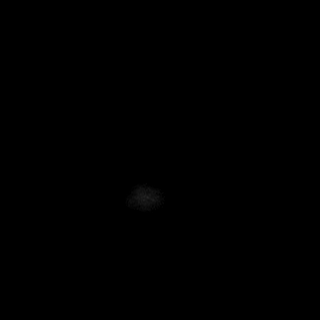

[T1 fat-sat post-contrast · axial · left · 4.0mm · 0.53mm/px · z∈[-91,+0]mm · 4 of 23 slices shown (1 of 3)]
[im 1/23]
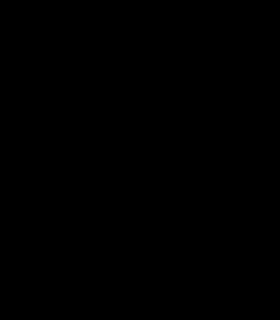
[im 8/23]
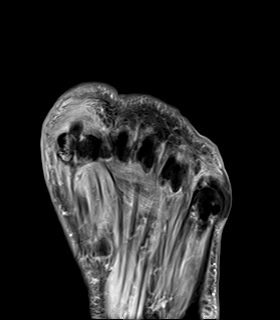
[im 15/23]
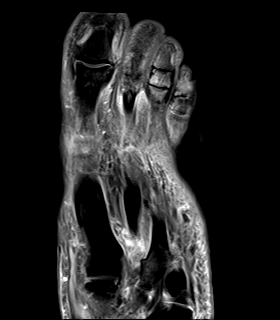
[im 23/23]
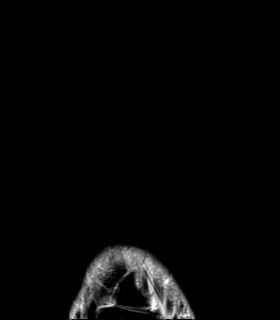

[T1 fat-sat post-contrast · coronal · left · 4.0mm · 0.47mm/px · 5 of 32 slices shown (2 of 3)]
[im 1/32]
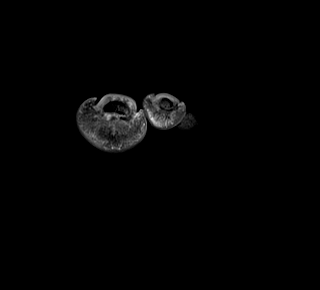
[im 8/32]
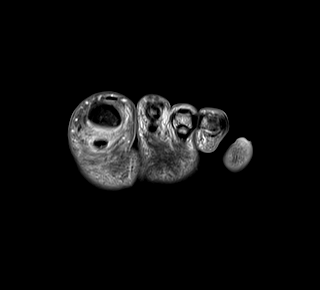
[im 16/32]
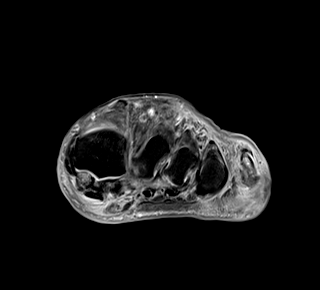
[im 24/32]
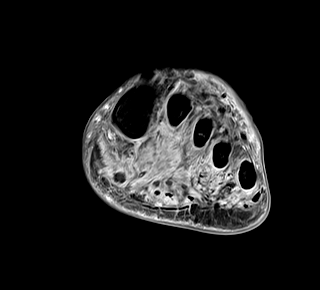
[im 32/32]
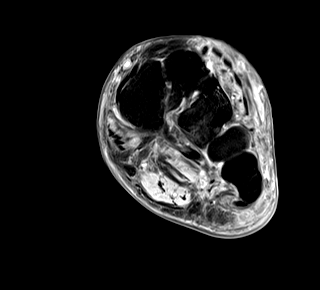

[T1 fat-sat post-contrast · sagittal · left · 4.0mm · 0.53mm/px · 4 of 22 slices shown (3 of 3)]
[im 1/22]
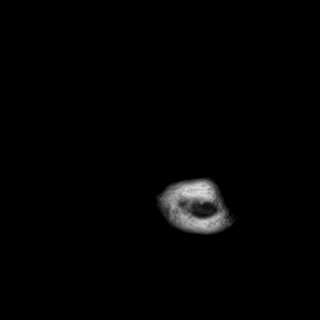
[im 8/22]
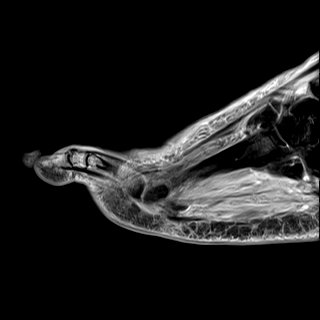
[im 15/22]
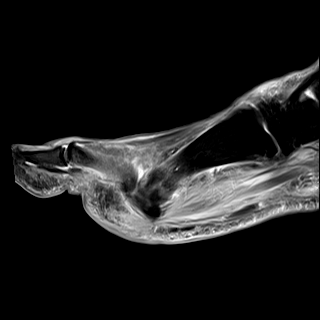
[im 22/22]
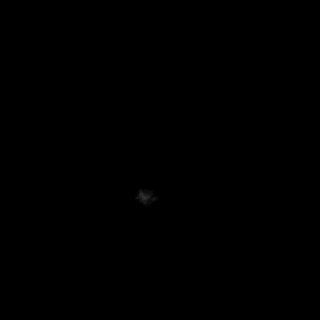

[40 of 40 positions shown; findings below may reference images not displayed]

FINDINGS: Soft tissue swelling is seen diffusely. There is abnormal soft tissue ulceration seen along the plantar soft tissues adjacent to the first MTP joint. The adjacent medial and lateral sesamoid bones demonstrate mild edema and enhancement. There is also abnormal soft tissue swelling and edema throughout the fifth digit with plantar soft tissue swelling and likely ulceration. There is abnormal T1 hypointensity, T2 hyperintensity, and enhancement of the phalanges of the fifth digit in keeping with osteomyelitis. Other bony structures appear normal in signal. No fracture or dislocation. Alignment is maintained.
IMPRESSION: 1. Findings in keeping with osteomyelitis of the phalanges of the fifth digit.
2. Soft tissue ulceration seen along the plantar soft tissues adjacent to the first MTP joint. There is mild edema and enhancement of the adjacent medial and lateral sesamoid bones of the first digit which may indicate early osteomyelitis.

## 2023-02-06 NOTE — Telephone Encounter (Signed)
Patient last visit to wound clinic on 10/14/2022. No further communication from patient. Patient discharged from wound clinic at this time.

## 2023-06-11 ENCOUNTER — Encounter

## 2023-07-04 ENCOUNTER — Ambulatory Visit: Admit: 2023-07-11 | Payer: PRIVATE HEALTH INSURANCE | Attending: Internal Medicine | Primary: Internal Medicine

## 2023-07-04 MED ORDER — DOXYCYCLINE HYCLATE 100 MG PO TABS
100 | ORAL_TABLET | Freq: Two times a day (BID) | ORAL | 0 refills | 7.00000 days | Status: DC
Start: 2023-07-04 — End: 2023-07-11

## 2023-07-17 NOTE — Progress Notes (Signed)
 Dominic Stephens (DOB:  March 03, 1955) is a 68 y.o. male, Established patient, here for evaluation of the following chief complaint(s):  Eye Problem (?Eye lid infection - x 1 week- Left eye)         Assessment & Plan      Results    1. Blepharitis of left upper eyelid, unspecified type  2. Type 2 diabetes mellitus with hyperglycemia, without long-term current use of insulin  (HCC)  Blepharitis left upper eyelid patient will be treated with doxycycline  100 mg twice a day for 7 days warm compresses can be used patient will call if he is not doing better  Diabetes mellitus patient is overdue for blood work to check on hemoglobin A1c last hemoglobin A1c was 7.8  No follow-ups on file.       Subjective   History of Present Illness  This is a 67 year old male coming today for evaluation of swelling of left upper eyelid.  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 rate 18, height 1.969 m (6' 5.5), weight (!) 138.8 kg (306 lb).  Physical Exam  Well-developed well-nourished male no acute distress  Head eyes ears nose and throat swelling and erythema of left upper lid  Neck is supple no JVD  Heart is S1-S2  Lungs are clear  Abdomen is soft bowel sounds are positive           The patient (or guardian, if applicable) and other individuals in attendance with the patient were advised that Artificial Intelligence will be utilized during this visit to record, process the conversation to generate a clinical note and to support improvement of the AI technology. The patient (or guardian, if applicable) and other individuals in attendance at the appointment consented to the use of AI, including the recording.      An electronic signature was used to authenticate this note.    --Nancyann FORBES Seat, MD

## 2023-07-21 NOTE — Telephone Encounter (Addendum)
-----   Message from Dr. Nancyann Seat, MD sent at 07/17/2023  8:22 AM EDT -----  Patient needs appointment follow-up on diabetes and Medicare annual wellness I did order blood work patient needs to do that fasting

## 2023-07-21 NOTE — Telephone Encounter (Signed)
 Forwarding to [facility] Team to reach out to patient to assist him with scheduling a DM Follow Up/AWV with Dr. Sherri as per his request.....    Please also inform him of the fasting laboratory orders generated for him Dr. Sherri would like him to have drawn prior to the appointment. (Should fast 8-12 hours prior to having these drawn).

## 2023-07-24 ENCOUNTER — Ambulatory Visit: Payer: MEDICARE | Primary: Internal Medicine

## 2023-07-29 NOTE — Telephone Encounter (Signed)
 LM and sent MYC mssg.

## 2023-08-06 NOTE — Telephone Encounter (Signed)
 LVM to schedule AWV/DM follow up, also let patient know appt can be scheduled with PCP or another available provider if there is not a readily available appt with PCP.

## 2023-08-13 ENCOUNTER — Encounter

## 2023-08-13 NOTE — Telephone Encounter (Signed)
 Patient's name and date of birth verified at start of call.   Incoming refill request.  Caller has been notified that we require a minimum of 2 business days for refill processing. (This does not include weekends, holidays, etc.)      Dominic Stephens  1955-10-24      Confirmed best contact number:   Home Phone [phone] (home)    Medications Requested:  Requested Prescriptions     Pending Prescriptions Disp Refills    atenolol  (TENORMIN ) 50 MG tablet [Pharmacy Med Name: Atenolol  50 MG Oral Tablet] 180 tablet 0     Sig: Take 1 tablet by mouth twice daily    glimepiride  (AMARYL ) 2 MG tablet 180 tablet 3     Sig: Take 1 tablet by mouth 2 times daily (with meals)       Preferred Pharmacy:   [facility] [address] [place] - [address] - P [phone] - F [phone]   Has patient already contacted pharmacy to confirm no refills were on file: Yes    Notes for office regarding medication request:      Patient is out     Other instructions and notes:  Last visit with provider: 07/04/2023  Next scheduled visit with provider: Visit date not found  *If next visit is not scheduled, book next needed visit or document if recall was added to list prior to sending for processing*  Inform patient that this needs to be on file before sending request as we do require for them to remain up-to-date with their recommended healthcare in order to avoid any interruptions in our ability to provide ongoing care such as refills.     Patient MyChart Status:  For Active Patients - Patient has been notified that they will receive an automated notification via MyChart once their script has been processed.   For Inactive Patients - Patient is aware that we have a patient portal, MyChart, which offers many benefits such as being able to request their refills electronically and receive automated notifications when scripts are processed.  In addition, they can schedule and manage appointments, view their testing results and visit notes,  and stay connected with their care team.  Patient offered MyChart today.  Patient accepted.  (Send link for set up if accepted)

## 2023-08-14 MED ORDER — ATENOLOL 50 MG PO TABS
50 | ORAL_TABLET | Freq: Two times a day (BID) | ORAL | 3 refills | Status: AC
Start: 2023-08-14 — End: ?

## 2023-08-14 MED ORDER — GLIMEPIRIDE 2 MG PO TABS
2 | ORAL_TABLET | Freq: Two times a day (BID) | ORAL | 0 refills | Status: AC
Start: 2023-08-14 — End: ?

## 2023-08-14 NOTE — Telephone Encounter (Signed)
 Antihyperglycemics Protocol Failed07/16/2025 11:21 AM   Protocol Details Last creatinine level resulted within the past 12 months    Last HgbA1c resulted within the past 6 months    Last GFR = or > 30 in the past 12 months    Visit with authorizing provider in past 6 months or upcoming 90 days            Medications Requested:  Requested Prescriptions     Pending Prescriptions Disp Refills    atenolol  (TENORMIN ) 50 MG tablet [Pharmacy Med Name: Atenolol  50 MG Oral Tablet] 180 tablet 0     Sig: Take 1 tablet by mouth twice daily    glimepiride  (AMARYL ) 2 MG tablet 180 tablet 3     Sig: Take 1 tablet by mouth 2 times daily (with meals)       Preferred Pharmacy:   [facility] [address] [place] - [address] - P [phone] [address] [phone]  [address]  [place] [address]  Phone: [phone] Fax: [phone]    [facility] [place], [place] - [address] - [place] [phone] [address] [phone]  [address]  [place] [address]  Phone: [phone] Fax: [phone]      Date of Last Refill:     Prescription Refill Protocol reviewed: Yes    Allergy List Reviewed and Verified: Yes    Possible medication to medication interactions reviewed: Yes    Last appt @ PCP Office: 07/04/2023     No future appointments.    MOST RECENT BLOOD PRESSURES  BP Readings from Last 3 Encounters:   07/04/23 (!) 146/72   10/05/22 112/78   09/23/22 130/74         MOST RECENT LAB DATA  Lab Results   Component Value Date/Time    K 5.1 08/12/2022 04:07 PM    ALT 22 08/12/2022 04:07 PM    TSH 1.570 10/31/2021 01:38 PM    CHOL 155 08/12/2022 04:07 PM    HGB 14.2 08/12/2022 04:07 PM    HCT 44.4 08/12/2022 04:07 PM    HBA1CPOC 7.4 05/08/2022 03:26 PM    INR 1.2 06/10/2022 04:47 AM

## 2023-08-18 NOTE — Telephone Encounter (Signed)
 LVM

## 2023-08-19 NOTE — Telephone Encounter (Signed)
 Appt made, pt said he will figure out annual appt later when he comes in for this appt    Future Appointments   Date Time Provider Department Center   09/15/2023 11:30 AM Tarry, Arlean [place], MD NPSSN [facility]

## 2023-08-19 NOTE — Telephone Encounter (Signed)
 Left Message / Sent MYC message. Several attempts made, closing encounter.

## 2023-08-27 ENCOUNTER — Inpatient Hospital Stay: Admit: 2023-08-27 | Payer: MEDICARE | Primary: Internal Medicine

## 2023-08-27 DIAGNOSIS — N200 Calculus of kidney: Principal | ICD-10-CM

## 2023-08-27 NOTE — Telephone Encounter (Signed)
 LMTCB

## 2023-08-27 NOTE — Telephone Encounter (Signed)
-----   Message from Dr. Nancyann Seat, MD sent at 08/27/2023  3:16 PM EDT -----  Nonobstructing stone left kidney stable left renal cyst 1.4 cm bladder stone follow-up urology

## 2023-08-28 NOTE — Telephone Encounter (Signed)
 2nd attempt - LMTCB

## 2023-08-28 NOTE — Telephone Encounter (Signed)
-----   Message from Dr. Nancyann Seat, MD sent at 08/27/2023  3:16 PM EDT -----  Nonobstructing stone left kidney stable left renal cyst 1.4 cm bladder stone follow-up urology

## 2023-09-10 ENCOUNTER — Inpatient Hospital Stay: Admit: 2023-09-10 | Payer: MEDICARE | Primary: Internal Medicine

## 2023-09-10 DIAGNOSIS — Z125 Encounter for screening for malignant neoplasm of prostate: Principal | ICD-10-CM

## 2023-09-10 DIAGNOSIS — I10 Essential (primary) hypertension: Principal | ICD-10-CM

## 2023-09-10 LAB — CBC WITH AUTO DIFFERENTIAL
Basophils %: 1 %
Basophils Absolute: 0.07 K/UL (ref 0.00–0.10)
Eosinophils %: 4.5 %
Eosinophils Absolute: 0.33 K/UL (ref 0.00–0.50)
Hematocrit: 44.5 % (ref 40.1–51.0)
Hemoglobin: 14.4 g/dL (ref 13.7–17.5)
Immature Granulocytes %: 1.4 %
Immature Granulocytes Absolute: 0.1 K/UL — ABNORMAL HIGH (ref 0.00–0.03)
Lymphocytes Absolute: 1.79 K/UL (ref 1.20–3.70)
Lymphocytes: 24.7 %
MCH: 29.9 pg (ref 25.6–32.2)
MCHC: 32.4 g/dL (ref 32.2–35.5)
MCV: 92.5 FL (ref 80.0–95.0)
MPV: 9.1 FL — ABNORMAL LOW (ref 9.4–12.4)
Monocytes %: 9.4 %
Monocytes Absolute: 0.68 K/UL (ref 0.20–0.80)
Neutrophils Absolute: 4.29 K/UL (ref 1.60–6.10)
Nucleated RBCs: 0 /100{WBCs} (ref 0–0.02)
Platelets: 167 K/uL (ref 150–400)
RBC: 4.81 M/uL (ref 4.51–5.93)
RDW: 13.2 % (ref 11.6–14.4)
Seg Neutrophils: 59 %
WBC: 7.3 K/uL (ref 4.0–10.0)
nRBC: 0 K/uL

## 2023-09-10 LAB — LIPID PANEL
Chol/HDL Ratio: 5.2
Cholesterol, Total: 181 mg/dL (ref <200)
HDL: 35 mg/dL — ABNORMAL LOW (ref >60)
LDL Cholesterol: 82.2 mg/dL (ref <100)
Non-HDL Cholesterol: 146 mg/dL — ABNORMAL HIGH (ref <130)
Triglycerides: 319 mg/dL — ABNORMAL HIGH (ref 0–150)

## 2023-09-10 LAB — ALBUMIN/CREATININE RATIO, URINE
Albumin Urine: 1258 mg/L — ABNORMAL HIGH (ref 0–20.00)
Albumin/Creatinine Ratio: 673 mg/g — ABNORMAL HIGH (ref <30)
Creatinine, Ur: 187 mg/dL (ref 40–278)

## 2023-09-10 LAB — COMPREHENSIVE METABOLIC PANEL
ALT: 63 U/L — ABNORMAL HIGH (ref 0–50)
AST: 53 U/L — ABNORMAL HIGH (ref 0–50)
Albumin/Globulin Ratio: 1.2 (ref 1.0–3.0)
Albumin: 4 g/dL (ref 3.5–5.2)
Alk Phosphatase: 93 U/L (ref 40–129)
Anion Gap: 18 mmol/L — ABNORMAL HIGH (ref 5–15)
BUN/Creatinine Ratio: 24 — ABNORMAL HIGH (ref 12–20)
BUN: 37 mg/dL — ABNORMAL HIGH (ref 8–23)
CO2: 19 mmol/L — ABNORMAL LOW (ref 22–29)
Calcium: 9.3 mg/dL (ref 8.8–10.2)
Chloride: 99 mmol/L (ref 98–107)
Creatinine: 1.52 mg/dL — ABNORMAL HIGH (ref 0.67–1.17)
Est, Glom Filt Rate: 50 ml/min/1.73m2 — ABNORMAL LOW (ref >60)
Glucose: 376 mg/dL — ABNORMAL HIGH (ref 70–99)
Potassium: 4.5 mmol/L (ref 3.5–5.1)
Sodium: 136 mmol/L (ref 136–145)
Total Bilirubin: 0.53 mg/dL (ref 0–1.20)
Total Protein: 7.3 g/dL (ref 6.4–8.3)

## 2023-09-10 LAB — PSA, DIAGNOSTIC: PSA: 1.94 ng/mL (ref 0–4.00)

## 2023-09-10 LAB — HEMOGLOBIN A1C
Estimated Avg Glucose: 289 mg/dL
Hemoglobin A1C: 11.7 % — ABNORMAL HIGH (ref 4.0–6.0)

## 2023-09-15 ENCOUNTER — Ambulatory Visit: Admit: 2023-09-15 | Discharge: 2023-09-15 | Payer: MEDICARE | Primary: Internal Medicine

## 2023-09-15 VITALS — BP 132/76 | HR 65 | Ht 77.5 in | Wt 311.1 lb

## 2023-09-15 DIAGNOSIS — E118 Type 2 diabetes mellitus with unspecified complications: Principal | ICD-10-CM

## 2023-09-15 MED ORDER — GLIMEPIRIDE 2 MG PO TABS
2 | ORAL_TABLET | Freq: Two times a day (BID) | ORAL | 2 refills | 30.00000 days | Status: DC
Start: 2023-09-15 — End: 2023-12-29

## 2023-09-15 NOTE — Addendum Note (Signed)
 Addended byBETHA EVELIA DRAGON F on: 09/15/2023 12:19 PM     Modules accepted: Orders

## 2023-09-15 NOTE — Progress Notes (Signed)
 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        URINE CULTURE with Raotella ornitologica       Lab Results   Component Value Date    NA 136 09/10/2023    K 4.5 09/10/2023    CL 99 09/10/2023    CO2 19 (L) 09/10/2023    BUN 37 (H) 09/10/2023    CREATININE 1.52 (H) 09/10/2023    GLUCOSE 376 (H) 09/10/2023    CALCIUM  9.3 09/10/2023    BILITOT 0.53 09/10/2023    ALKPHOS 93 09/10/2023    AST 53 (H) 09/10/2023    ALT 63 (H) 09/10/2023    LABGLOM 50 (L) 09/10/2023    GFRAA 74 01/21/2019    GLOB 2.4 01/21/2019        Lab Results   Component Value Date    WBC 7.3 09/10/2023    HGB 14.4 09/10/2023    HCT 44.5 09/10/2023    MCV 92.5 09/10/2023    PLT 167 09/10/2023      Hemoglobin A1C   Date Value Ref Range Status   09/10/2023 11.7 (H) 4.0 - 6.0 % Final     Comment:     Reference Range  Diabetic >=6.5%  Prediabetes  5.7-6.4%  Normal       <5.7%  The American Diabetes Association recommends an A1C goal of <7% for most adults with diabetes.        Height: 1.969 m (6' 5.5), Weight - Scale: (!) 141.1 kg (311 lb 1.6 oz), BP: 132/76     Dominic Stephens is a 68 y.o. y.o. male, Established patient, here for evaluation of the following chief complaint(s):  Follow-up (Topics addressed today: FU for medications, diabetes, test results/)        Assessment & Plan    ICD-10-CM    1. Diabetes mellitus type 2 with complications (HCC)  E11.8         Assessment & Plan  1. Urinary tract infection:  - The patient's urine culture revealed an unusual organism, Raoultella ornithinolytica, which can be challenging to treat with  antibiotics.  - He is currently on cephalexin  500 mg twice a day for 7 days, prescribed by [facility]. He will return to [facility] on Friday to have his catheter changed and provide another urine sample to check if the infection has cleared.    2. Diabetes mellitus:  - His glucose level was recorded at 376, indicating poor control of his diabetes. His last A1c was 11.7, further confirming uncontrolled diabetes.  - The potential benefits and risks of Jardiance and Ozempic were discussed, including their effects on weight loss and kidney health.  - The dosage of glimepiride  will be increased from 2 mg to 3 mg. He is advised to carry glucose tablets with him at all times and to take one immediately if he experiences hypoglycemia. A follow-up appointment will be scheduled in one month to monitor his response to the increased dosage.    3.  Kidney dysfunction:  - The patient has a history of kidney dysfunction, which has been fluctuating since 2023. He was hospitalized in 2023 due to retention caused by his prostate, leading to a kidney infection.  - His potassium levels are currently good, but his protein levels are slightly high.  - He has a permanent Foley catheter in place to help empty his bladder daily. The importance of keeping infections under control and ensuring proper urine flow through the prostate and bladder was emphasized.    4. Bladder stones:  - The patient has a stone in his bladder, which may be contributing to recurrent infections.  - The possibility of removing the stone was discussed, as bacteria can grow inside the stone, making it difficult to eradicate the infection.    Follow-up: A follow-up appointment will be scheduled in one month to monitor his response to the increased dosage of glimepiride .      Subjective   History of Present Illness  The patient presents for review of blood work and medication management.    He reports that his kidney-related lab values have been  inconsistent since 2023. In 2023, he was hospitalized at [facility] for a week due to urinary retention caused by his prostate, which led to a kidney infection. During this time, his potassium, creatinine, and bilirubin levels were elevated. These levels have since fluctuated. He has a permanent Foley catheter in place, which has been beneficial in preventing backflow and ensuring complete bladder emptying. He also mentions that his protein level is slightly elevated. He is currently on an antibiotic regimen for a urinary tract infection (UTI), prescribed by his urologist. He has been taking cephalexin  500 mg twice daily for the past 5 days. He is scheduled to have his catheter changed this Friday and will provide another urine sample to check if the infection has cleared. He is unsure if the infection is related to his prostate or if bacteria have ascended into his urethra around the sides of the tube, as he has noticed discharge from his bladder. He is allergic to sulfa drugs. He has a bladder stone and another stone elsewhere, which are scheduled to be addressed later this fall. He believes the bladder stone may be contributing to recurrent infections. He has been informed that the likelihood of surgery improving his condition is about 17 percent, and that he would need to self-catheterize several times a day. He is considering this option as he currently has no issues with urination and does not want to undergo the discomfort of frequent catheterization. He is hopeful that a more effective treatment for prostate shrinkage will be developed in the future.    He is currently taking glimepiride  for diabetes management but does not take metformin . He has not tried Jardiance or Ozempic and is trying to avoid insulin  at this time. His weight had decreased to 264 pounds but has since increased to 311 pounds. He used to be a regular gym-goer but has not lifted weights since 2000. He is aware of the importance  of diet and exercise and is trying to establish a routine that allows him to visit the gym five days a week. He reports feeling fatigued but does not have any chest pain, pressure, shortness of breath, dizziness, or abdominal pain.    He had a foot ulcer that required surgical intervention to remove some remaining infection. However, he believes he wrapped his foot too tightly with Coban, which may have cut off blood circulation  to his small toe. He noticed a large blister on his small toe after removing his sock one day. Despite his efforts to care for it, the blister eventually burst and became infected. After a couple of weeks, he removed the bandage and found his toe had turned purple-black due to lack of oxygen. He ended up losing his small toe, which was subsequently removed along with the ball of his large toe. His foot is now healed.    PAST SURGICAL HISTORY:  Foot ulcer surgery  Toe amputation    FAMILY HISTORY  He does not have a family history of thyroid cancer or MEN2 syndrome.    Current Outpatient Medications   Medication Sig Dispense Refill    atenolol  (TENORMIN ) 50 MG tablet Take 1 tablet by mouth twice daily 180 tablet 3    glimepiride  (AMARYL ) 2 MG tablet Take 1 tablet by mouth 2 times daily (with meals) 180 tablet 0    blood glucose test strips (ONETOUCH ULTRA) strip Test sugars once daily E11.65 100 each 3    Blood Glucose Monitoring Suppl (ONE TOUCH ULTRA 2) w/Device KIT Test sugars once daily E11.65 1 kit 0    ONE TOUCH ULTRASOFT LANCETS MISC Test sugars once daily E11.65 100 each 3    naproxen  (NAPROSYN ) 500 MG tablet Take 1 tablet by mouth 2 times daily as needed for Pain Take with plenty fluid and food (Patient not taking: Reported on 09/15/2023) 30 tablet 0     No current facility-administered medications for this visit.      Review of Systems  Review of Systems       Objective   Vitals:    09/15/23 1141   BP: 132/76   Pulse:    SpO2:        Physical Exam  Physical Exam      Labs    Lab  Results   Component Value Date    NA 136 09/10/2023    K 4.5 09/10/2023    CL 99 09/10/2023    CO2 19 (L) 09/10/2023    BUN 37 (H) 09/10/2023    CREATININE 1.52 (H) 09/10/2023    GLUCOSE 376 (H) 09/10/2023    CALCIUM  9.3 09/10/2023    BILITOT 0.53 09/10/2023    ALKPHOS 93 09/10/2023    AST 53 (H) 09/10/2023    ALT 63 (H) 09/10/2023    LABGLOM 50 (L) 09/10/2023    GFRAA 74 01/21/2019    GLOB 2.4 01/21/2019        Lab Results   Component Value Date    CHOL 181 09/10/2023    TRIG 319 (H) 09/10/2023    HDL 35 (L) 09/10/2023    LDL 82.2 09/10/2023    CHOLHDLRATIO 5.2 09/10/2023        Lab Results   Component Value Date    WBC 7.3 09/10/2023    HGB 14.4 09/10/2023    HCT 44.5 09/10/2023    MCV 92.5 09/10/2023    PLT 167 09/10/2023        Hemoglobin A1C   Date Value Ref Range Status   09/10/2023 11.7 (H) 4.0 - 6.0 % Final     Comment:     Reference Range  Diabetic >=6.5%  Prediabetes  5.7-6.4%  Normal       <5.7%  The American Diabetes Association recommends an A1C goal of <7% for most adults with diabetes.       Hemoglobin A1C, POC   Date Value Ref Range  Status   05/08/2022 7.4 % Final        Lab Results   Component Value Date    TSH 1.570 10/31/2021          No follow-ups on file.         On this date 09/15/2023 I have spent 30 minutes reviewing previous notes, test results and face to face with the patient discussing the diagnosis and importance of compliance with the treatment plan as well as documenting on the day of the visit.    The patient (or guardian, if applicable) and other individuals in attendance with the patient were advised that Artificial Intelligence will be utilized during this visit to record, process the conversation to generate a clinical note and to support improvement of the AI technology. The patient (or guardian, if applicable) and other individuals in attendance at the appointment consented to the use of AI, including the recording.       The patient (or guardian, if applicable) and other  individuals in attendance with the patient were advised that Artificial Intelligence will be utilized during this visit to record, process the conversation to generate a clinical note, and support improvement of the AI technology. The patient (or guardian, if applicable) and other individuals in attendance at the appointment consented to the use of AI, including the recording.

## 2023-09-23 NOTE — Telephone Encounter (Signed)
 Med refill

## 2023-09-24 NOTE — Telephone Encounter (Addendum)
-----   Message from Dr. Nancyann Seat, MD sent at 09/23/2023  1:32 PM EDT -----  Labs show glucose is out of control hemoglobin A1c 11.7 abnormal liver function tests probably related to the diabetes elevated triglycerides related to diabetes increase urinary protein due to diabetes needs to get blood sugar under better control.      Pt notified       //////////////////////////////////////////////////////////    IN ADDITION:    Pt is trying to get a hold of his urologist regarding a repeat urine test s/p abx tx  - No repeat Cx noted only a U/A on 09/11/23 (which is positive for nitrates)    Original urine Cx below  Pt was treated with a course of Keflex     Pt continues to have sx's s/p abx   - Thick pus like substance at end of urination - same color as urine  - Burning sensation at end of urination   - Foul smelling urine     Foley cath in place   Last cath change was Friday 09/19/23 in order to repeat the urine test   Pt was 3 days post abx therapy at that time     Pt will continue to reach out to urology   However, giving the rare gram negative bacterium and ongoing sx's - msg to PCP    Please review original Cx report below  Pt still symptomatic s/p Keflex        Component 2 wk ago   Urine Culture >=100000 CFU/mL Raoultella ornithinolytica Abnormal    Resulting Agency    Susceptibility    Organism Antibiotic Method Susceptibility   Raoultella ornithinolytica MBTID     Raoultella ornithinolytica Ampicillin MIC >16 ug/ml: Resistant   Raoultella ornithinolytica Ampicillin/Sulbactam MIC <=4/2 ug/ml: Sensitive   Raoultella ornithinolytica Cefazolin MIC <=2 ug/ml: Sensitive   Raoultella ornithinolytica Cefoxitin MIC <=8 ug/ml: Sensitive   Raoultella ornithinolytica Ceftriaxone  MIC <=1 ug/ml: Sensitive   Raoultella ornithinolytica Ciprofloxacin  MIC <=0.25 ug/ml: Sensitive   Raoultella ornithinolytica Gentamicin MIC <=2 ug/ml: Sensitive   Raoultella ornithinolytica Nitrofurantoin  MIC <=32 ug/ml: Sensitive   Raoultella  ornithinolytica Trimethoprim/Sulfamethoxazole MIC <=0.5/9.5 ug/ml: Sensitive   Specimen Collected: 09/04/23 11:43

## 2023-09-26 NOTE — Telephone Encounter (Addendum)
 Chart Reviewed...    Patient was treated with Macrobid  today by Urology.  Closing Encounter.

## 2023-12-17 ENCOUNTER — Encounter: Payer: MEDICARE | Primary: Internal Medicine

## 2023-12-26 ENCOUNTER — Encounter

## 2023-12-29 MED ORDER — GLIMEPIRIDE 2 MG PO TABS
2 | ORAL_TABLET | Freq: Two times a day (BID) | ORAL | 0 refills | 30.00000 days | Status: AC
Start: 2023-12-29 — End: ?

## 2023-12-29 NOTE — Telephone Encounter (Signed)
"  Medications Requested:  Requested Prescriptions     Signed Prescriptions Disp Refills    glimepiride  (AMARYL ) 2 MG tablet 180 tablet 0     Sig: TAKE 1 TABLET BY MOUTH TWICE DAILY WITH MEALS     Authorizing Provider: SHERRI NANCYANN BRAVO     Ordering User: SHEPPARD CATHERENE SAILOR       Preferred Pharmacy:   [id number] - [place], [place] - [address] - P [phone] [address] [phone]  [address]  [place] [address]  Phone: [phone] Fax: [phone]    [facility] - [place], [place] - [address] - [place] [phone] [address] [phone]  [address]  [place] [address]  Phone: [phone] Fax: [phone]      Date of Last Refill: 09/15/23     Antihyperglycemics Protocol Passed11/28/2025 09:44 PM   Protocol Details Last creatinine level resulted within the past 12 months    Last HgbA1c resulted within the past 6 months    Visit with authorizing provider in past 6 months or upcoming 90 days    Last GFR = or > 30 in the past 12 months       Last appt @ PCP Office: 09/15/2023     No future appointments.    MOST RECENT BLOOD PRESSURES  BP Readings from Last 3 Encounters:   09/15/23 132/76   07/04/23 (!) 146/72   10/05/22 112/78         MOST RECENT LAB DATA  Lab Results   Component Value Date/Time    K 4.5 09/10/2023 11:21 AM    ALT 63 09/10/2023 11:21 AM    TSH 1.570 10/31/2021 01:38 PM    CHOL 181 09/10/2023 11:21 AM    HGB 14.4 09/10/2023 11:21 AM    HCT 44.5 09/10/2023 11:21 AM    HBA1CPOC 7.4 05/08/2022 03:26 PM    INR 1.2 06/10/2022 04:47 AM     "

## 2024-01-27 NOTE — Telephone Encounter (Signed)
"  INCOMING CALL NOTE     Caller Name:  Haseeb    Relationship of Caller to Patient:       Return number:  [phone]    Reason for Call:  cough,sinus congestion brownish discharge    Is the reason for call a hot word listed on the triage list?       Call Notes:   Future Appointments   Date Time Provider Department Center   01/28/2024  8:20 AM Tina Tawni Amble, PA [facility]         Staff Member Action: routed to front desk     -------------    Is a visit being scheduled within the next 48 hours?    If yes, please ensure that the Travel Screening questionnaire is completed on the phone with the patient.  "

## 2024-01-28 ENCOUNTER — Ambulatory Visit
Admit: 2024-01-28 | Discharge: 2024-01-28 | Payer: MEDICARE | Attending: Physician Assistant | Primary: Internal Medicine

## 2024-01-28 ENCOUNTER — Inpatient Hospital Stay: Admit: 2024-01-28 | Payer: MEDICARE | Primary: Internal Medicine

## 2024-01-28 MED ORDER — AZITHROMYCIN 250 MG PO TABS
250 | ORAL_TABLET | ORAL | 0 refills | 5.00000 days | Status: DC
Start: 2024-01-28 — End: 2024-02-04

## 2024-01-28 MED ORDER — BENZONATATE 100 MG PO CAPS
100 | ORAL_CAPSULE | Freq: Three times a day (TID) | ORAL | 0 refills | 10.00000 days | Status: AC | PRN
Start: 2024-01-28 — End: 2024-02-07

## 2024-01-28 NOTE — Progress Notes (Signed)
 "Dominic Stephens is a 68 y.o. male who is seen in the clinic c/o sinus/URI symptoms x 1 week.  Assessment & Plan  Respiratory infection  Acute cough  Symptoms started around 01/20/2024, including fatigue, coughing, and wheezing. Cough and wheeze is more of an issue when lying down. Tells me it feels like COVID that he had in the past. Did not do home COVID test. Now 7-8 days in, no test done in office.  Consider COVID, Flu, Pneumonia, Viral URI.  Will get CXR to r/o pneumonia.  Will treat with Azithromycin  and Tessalon  Perles.  OTC medications for additional symptom relief ok.  Follow-up prn.     Orders:    azithromycin  (ZITHROMAX ) 250 MG tablet; 2 tabs on day 1 and then 1 tab po daily from day 2 to day 5    XR CHEST (2 VW); Future    benzonatate  (TESSALON ) 100 MG capsule; Take 1-2 capsules by mouth 3 times daily as needed for Cough      Return if symptoms worsen or fail to improve.    SUBJECTIVE  History of Present Illness  He did have sick exposure from someone at work on night of 12/23. On 12/24 he felt very fatigued. Went right to bed after work. Slept through 12/25 and 12/26. Friday he woke up and felt better, went to get his paycheck. Then came home and went back to bed. Felt unwell again over the weekend. Started with colored nasal discharge and sputum from cough.  Tells me sputum tastes horrible. Has a harder time breathing when he lies down. Has been taking OTC Flu/Cold medications, not sure which ones, also unsure if helpful. Tells me symptoms feel similar to prior COVID infection. Fatigue is horrible. No home COVID test done. VSS today. Denies CP, SOB, fever, chills, sweats, N/V, myalgia.    Patient is otherwise well with no other complaints today.      Review of Systems  A comprehensive review of systems was negative except for what was noted in the HPI.    OBJECTIVE  BP 122/60 (BP Site: Left Upper Arm, Patient Position: Sitting, BP Cuff Size: Medium Adult)   Pulse 68   Temp 98.6 F (37 C)  (Temporal)   Ht 1.956 m (6' 5)   Wt 130.6 kg (288 lb)   SpO2 98%   BMI 34.15 kg/m       Physical Exam  Constitutional:       Appearance: He is well-developed and well-groomed. He is obese. He is not ill-appearing.   HENT:      Head: Normocephalic and atraumatic.      Right Ear: Tympanic membrane, ear canal and external ear normal.      Left Ear: Tympanic membrane, ear canal and external ear normal.      Nose:      Right Sinus: No maxillary sinus tenderness or frontal sinus tenderness.      Left Sinus: No maxillary sinus tenderness or frontal sinus tenderness.      Mouth/Throat:      Mouth: Mucous membranes are moist.      Pharynx: Oropharynx is clear. Uvula midline. No oropharyngeal exudate, posterior oropharyngeal erythema or uvula swelling.   Eyes:      Extraocular Movements: Extraocular movements intact.      Conjunctiva/sclera: Conjunctivae normal.   Cardiovascular:      Rate and Rhythm: Normal rate and regular rhythm.      Heart sounds: S1 normal and S2 normal.   Pulmonary:  Effort: Pulmonary effort is normal. No respiratory distress.      Breath sounds: Normal breath sounds.   Musculoskeletal:      Cervical back: Normal range of motion and neck supple.   Lymphadenopathy:      Cervical: No cervical adenopathy.   Skin:     General: Skin is warm and dry.   Neurological:      Mental Status: He is alert and oriented to person, place, and time.      Gait: Gait is intact.   Psychiatric:         Mood and Affect: Mood and affect normal.         Behavior: Behavior is cooperative.       The patient (or guardian, if applicable) and other individuals in attendance with the patient were advised that Artificial Intelligence will be utilized during this visit to record, process the conversation to generate a clinical note, and support improvement of the AI technology. The patient (or guardian, if applicable) and other individuals in attendance at the appointment consented to the use of AI, including the recording.       An electronic signature was used to authenticate this encounter note.    Tawni Idell Barnacle, PA-C  "

## 2024-01-28 NOTE — Patient Instructions (Signed)
"  Get chest x-ray done - this is walk-in service at the hospital  Take Azithromycin  and Tessalon  Perles, as prescribed - sent to [facility]  Drink plenty of fluids  Get plenty of rest  Please call with any questions and/or concerns  "

## 2024-02-02 NOTE — Telephone Encounter (Signed)
"  Caller name: Rhodes Calvert phone number: [phone] - leave detailed voicemail if no answer      Symptoms: seen 12.31.25, place on antibiotic, still coughing, yellow nasal discharge      Appointment date/time: 1.7.26/11:40 - scheduled Wednesday due to patient's work schedule.      Staff member action: appt with Tawni Barnacle    "

## 2024-02-04 ENCOUNTER — Ambulatory Visit
Admit: 2024-02-04 | Discharge: 2024-02-04 | Payer: MEDICARE | Attending: Physician Assistant | Primary: Internal Medicine

## 2024-02-04 ENCOUNTER — Inpatient Hospital Stay: Admit: 2024-02-04 | Payer: MEDICARE | Primary: Internal Medicine

## 2024-02-04 VITALS — BP 110/62 | HR 70 | Temp 98.10000°F

## 2024-02-04 DIAGNOSIS — N3001 Acute cystitis with hematuria: Secondary | ICD-10-CM

## 2024-02-04 DIAGNOSIS — J988 Other specified respiratory disorders: Principal | ICD-10-CM

## 2024-02-04 LAB — COMPREHENSIVE METABOLIC PANEL
ALT: 61 U/L — ABNORMAL HIGH (ref 0–50)
AST: 45 U/L (ref 0–50)
Albumin/Globulin Ratio: 1.3 (ref 1.0–3.0)
Albumin: 4.1 g/dL (ref 3.5–5.2)
Alk Phosphatase: 83 U/L (ref 40–129)
Anion Gap: 13 mmol/L (ref 5–15)
BUN/Creatinine Ratio: 32 — ABNORMAL HIGH (ref 12–20)
BUN: 55 mg/dL — ABNORMAL HIGH (ref 8–23)
CO2: 21 mmol/L — ABNORMAL LOW (ref 22–29)
Calcium: 9.8 mg/dL (ref 8.8–10.2)
Chloride: 103 mmol/L (ref 98–107)
Creatinine: 1.74 mg/dL — ABNORMAL HIGH (ref 0.67–1.17)
Est, Glom Filt Rate: 42 ml/min/1.73m2 — ABNORMAL LOW (ref >60)
Glucose: 253 mg/dL — ABNORMAL HIGH (ref 70–99)
Potassium: 4.7 mmol/L (ref 3.5–5.1)
Sodium: 137 mmol/L (ref 136–145)
Total Bilirubin: 0.44 mg/dL (ref 0–1.20)
Total Protein: 7.3 g/dL (ref 6.4–8.3)

## 2024-02-04 LAB — CBC WITH AUTO DIFFERENTIAL
Basophils %: 0.5 %
Basophils Absolute: 0.05 K/UL (ref 0.00–0.10)
Eosinophils %: 1.8 %
Eosinophils Absolute: 0.17 K/UL (ref 0.00–0.50)
Hematocrit: 42.6 % (ref 40.1–51.0)
Hemoglobin: 14.1 g/dL (ref 13.7–17.5)
Immature Granulocytes %: 1.9 %
Immature Granulocytes Absolute: 0.18 K/UL — ABNORMAL HIGH (ref 0.00–0.03)
Lymphocytes Absolute: 1.78 K/UL (ref 1.20–3.70)
Lymphocytes: 18.8 %
MCH: 29.4 pg (ref 25.6–32.2)
MCHC: 33.1 g/dL (ref 32.2–35.5)
MCV: 88.9 FL (ref 80.0–95.0)
MPV: 8.6 FL — ABNORMAL LOW (ref 9.4–12.4)
Monocytes %: 9 %
Monocytes Absolute: 0.85 K/UL — ABNORMAL HIGH (ref 0.20–0.80)
Neutrophils Absolute: 6.44 K/UL — ABNORMAL HIGH (ref 1.60–6.10)
Nucleated RBCs: 0 /100{WBCs} (ref 0–0.02)
Platelets: 223 K/uL (ref 150–400)
RBC: 4.79 M/uL (ref 4.51–5.93)
RDW: 12.8 % (ref 11.6–14.4)
Seg Neutrophils: 68 %
WBC: 9.5 K/uL (ref 4.0–10.0)
nRBC: 0 K/uL

## 2024-02-04 MED ORDER — CIPROFLOXACIN HCL 500 MG PO TABS
500 | ORAL_TABLET | Freq: Two times a day (BID) | ORAL | 0 refills | 7.00000 days | Status: AC
Start: 2024-02-04 — End: 2024-02-11

## 2024-02-04 MED ORDER — CIPROFLOXACIN HCL 500 MG PO TABS
500 | ORAL_TABLET | Freq: Two times a day (BID) | ORAL | 0 refills | 7.00000 days | Status: DC
Start: 2024-02-04 — End: 2024-02-04

## 2024-02-04 NOTE — Telephone Encounter (Signed)
 Patient returning call.  Warm transfer to office

## 2024-02-04 NOTE — Telephone Encounter (Signed)
 Clinical unavailable at the time of patients return call. Patient states it is ok to leave a detailed message if he does not answer. Patient also mentioned he thought the call was about his test result from today. There is an additional encounter

## 2024-02-04 NOTE — Telephone Encounter (Signed)
 Patient returning call. Nurse unavailable. Please call back when available.  Signed By: Wanda Lusty     February 04, 2024

## 2024-02-04 NOTE — Patient Instructions (Addendum)
 Take Ciprofloxacin , as prescribed - sent to local pharmacy  Get labs done, no need to fast for these  Follow-up in 1 week  Please call with any questions and/or concerns

## 2024-02-04 NOTE — Telephone Encounter (Signed)
 Patient returning call regarding results.    Warm transfer to nurse line

## 2024-02-04 NOTE — Telephone Encounter (Signed)
-----   Message from [place], [place] sent at 01/28/2024  1:01 PM EST -----  Please call patient and let him know CXR is negative. He specifically asked for a call for results.    Ok to sign off note if there are no further question and/or concerns.  ----- Message -----  From: Edwina Brandy Incoming Results To [facility]  Sent: 01/28/2024   9:29 AM EST  To: Tawni Idell Barnacle, PA

## 2024-02-04 NOTE — Telephone Encounter (Signed)
 Left message requesting a call back.

## 2024-02-04 NOTE — Progress Notes (Signed)
 Dominic Stephens is a 69 y.o. male who is seen in the clinic c/o continued URI symptoms.  He was last seen on 01/28/2024 by me.  Assessment & Plan  Respiratory infection  Acute cough  Symptoms started 01/20/2024. Last seen here on 01/28/2024. Treated with Azithromcyin and Tessalon  perles with some improvement. Continues to feel very fatigued. Did not do home COVID test at time of symptoms onset but told me he felt like prior COVID infections. Seen in office after 7-8d of symptoms.   Looks unwell today but not cough or wheezing.  01/28/2024, CXR negative.  Pt notes respiratory symptoms have markedly improved but still feels unwell.  Will get CBC, CMP,.  Encouraged plenty of rest.  Follow-up in 1 week, sooner if needed.    Orders:    CBC with Auto Differential; Future    Comprehensive Metabolic Panel; Future    Acute cystitis with hematuria  Feels like urine has been darker than usual since he got sick. Does admit to feeling dehydrated in the beginning. Drinking more fluids now. Urine seems thicker at home and has an unusual odor.  UA done, machine would not read the strip. Visual review of dipstick showed Nitrite neg, Leuks Large, Blood Large. SG 1.020, Glu Neg/  Will send urine for culture.  Will tx empirically pending culture results. Has had UTI in the past where Cipro  worked well.  Follow-up in 1 week, sooner if needed.    Orders:    Comprehensive Metabolic Panel; Future    ciprofloxacin  (CIPRO ) 500 MG tablet; Take 1 tablet by mouth 2 times daily for 7 days    AMB POC URINALYSIS DIP STICK MANUAL W/O MICRO    Culture, Urine; Future      Return in about 1 week (around 02/11/2024) for F/U UTI/Respiratory Infx.      SUBJECTIVE  History of Present Illness  At last visit he presented with respiratory symptoms that started on night of 01/20/2024.  Noted being extremely fatigued and symptoms about like prior COVID infection.  COVID test not done at onset of symptoms. Not done at last OV due to symptoms x 7-8 days. XR  Chest was ordered 01/28/2024, showed no acute intrathoracic abnormality.  He was treated with Azithromycin  and Tessalon  Perles. Last dose of Azithromycin  was 02/01/2024.     Tells me he feels like he is slowly recovering. Appetite is better. Cough and wheezing has subsided. Continues to be extremely tired. Nasal discharge is now a lighter yellow. Also feels like he might have a UTI. Urine got dark shortly after he got sick. Admits he wasn't drinking much water , thinks he got dehydrated. Is drinking more fluids now but urine is still dark.  Sometimes only voiding a small amount of urine. Urine is thicker than normal and has an unusual odor. Denies fever, chills, N/V, dysuria, frequency/urgency, hematuria, flank pain, myalgia.    Patient has no other complaints today.      Review of Systems  A comprehensive review of systems was negative except for what was noted in the HPI.    OBJECTIVE  BP 110/62 (BP Site: Left Upper Arm, Patient Position: Sitting, BP Cuff Size: Medium Adult)   Pulse 70   Temp 98.1 F (36.7 C) (Temporal)   SpO2 97%       Physical Exam  Constitutional:       General: He is not in acute distress.     Appearance: He is well-developed and well-groomed. He is obese. He is ill-appearing.  He is not diaphoretic.   HENT:      Head: Normocephalic and atraumatic.      Right Ear: Tympanic membrane, ear canal and external ear normal.      Left Ear: Tympanic membrane, ear canal and external ear normal.      Nose:      Right Sinus: No maxillary sinus tenderness or frontal sinus tenderness.      Left Sinus: No maxillary sinus tenderness or frontal sinus tenderness.      Mouth/Throat:      Mouth: Mucous membranes are moist.      Pharynx: Oropharynx is clear. Uvula midline. No oropharyngeal exudate, posterior oropharyngeal erythema or uvula swelling.   Eyes:      Extraocular Movements: Extraocular movements intact.      Conjunctiva/sclera: Conjunctivae normal.   Cardiovascular:      Rate and Rhythm: Normal rate and  regular rhythm.      Heart sounds: S1 normal and S2 normal.   Pulmonary:      Effort: Pulmonary effort is normal. No respiratory distress.      Breath sounds: Normal breath sounds.   Musculoskeletal:      Cervical back: Normal range of motion and neck supple.      Right lower leg: No edema.      Left lower leg: No edema.   Lymphadenopathy:      Cervical: No cervical adenopathy.   Skin:     General: Skin is warm and dry.   Neurological:      Mental Status: He is alert and oriented to person, place, and time.      Gait: Gait is intact.   Psychiatric:         Mood and Affect: Mood and affect normal.         Behavior: Behavior is cooperative.       The patient (or guardian, if applicable) and other individuals in attendance with the patient were advised that Artificial Intelligence will be utilized during this visit to record, process the conversation to generate a clinical note, and support improvement of the AI technology. The patient (or guardian, if applicable) and other individuals in attendance at the appointment consented to the use of AI, including the recording.      An electronic signature was used to authenticate this encounter note.    Tawni Idell Barnacle, PA-C

## 2024-02-04 NOTE — Telephone Encounter (Signed)
-----   Message from [place], [place] sent at 01/28/2024  1:01 PM EST -----  Please call patient and let him know CXR is negative. He specifically asked for a call for results.     Ok to sign off note if there are no further question and/or concerns.  ----- Message -----  From: Edwina Brandy Incoming Results To [facility]  Sent: 01/28/2024   9:29 AM EST  To: Tawni Idell Barnacle, PA

## 2024-02-05 ENCOUNTER — Inpatient Hospital Stay: Admit: 2024-02-05 | Payer: MEDICARE | Primary: Internal Medicine

## 2024-02-05 NOTE — Telephone Encounter (Signed)
 Called patient and left detailed message informing him of results.  Signed By: Laneta SHAUNNA Hasten, LPN     February 05, 2024

## 2024-02-05 NOTE — Telephone Encounter (Signed)
 Called patient and left message for CB.  Signed By: Laneta SHAUNNA Hasten, LPN     February 05, 2024

## 2024-02-09 LAB — CULTURE, URINE: Culture: >100000 — AB

## 2024-02-10 NOTE — Telephone Encounter (Signed)
 LMTCB: check in to see how he is feeling, he has an appointment with Tawni Barnacle 1/14.

## 2024-02-10 NOTE — Telephone Encounter (Signed)
 Additional urine culture came back. May need to add additional medication if he is not feeling back to himself. I know he is scheduled to see me tomorrow but please call him today to see how he is feeling.    Ok to sign off note if there are no further question and/or concerns.

## 2024-02-10 NOTE — Telephone Encounter (Signed)
 INCOMING CALL NOTE     Caller Name:  Gloria    Relationship of Caller to Patient:   self    Return number:  [phone]     Reason for Call:  cancelled appointment on 1-14 because he is feeling better    Is the reason for call a hot word listed on the triage list?  No     Call Notes:      Staff Member Action:  noted    -------------    Is a visit being scheduled within the next 48 hours?  No   If yes, please ensure that the Travel Screening questionnaire is completed on the phone with the patient.

## 2024-02-11 ENCOUNTER — Ambulatory Visit: Payer: MEDICARE | Attending: Physician Assistant | Primary: Internal Medicine
